# Patient Record
Sex: Female | Born: 1970
Health system: Southern US, Community
[De-identification: ages and names within clinical notes are randomized; demographics above are authoritative.]

## PROBLEM LIST (undated history)

## (undated) DIAGNOSIS — T7840XA Allergy, unspecified, initial encounter: Secondary | ICD-10-CM

## (undated) DIAGNOSIS — R7989 Other specified abnormal findings of blood chemistry: Secondary | ICD-10-CM

## (undated) DIAGNOSIS — Z91018 Allergy to other foods: Secondary | ICD-10-CM

## (undated) DIAGNOSIS — N764 Abscess of vulva: Secondary | ICD-10-CM

## (undated) DIAGNOSIS — J189 Pneumonia, unspecified organism: Secondary | ICD-10-CM

## (undated) HISTORY — DX: Other specified abnormal findings of blood chemistry: R79.89

## (undated) HISTORY — DX: Allergy to other foods: Z91.018

## (undated) HISTORY — DX: Allergy, unspecified, initial encounter: T78.40XA

## (undated) HISTORY — DX: Abscess of vulva: N76.4

## (undated) HISTORY — DX: Pneumonia, unspecified organism: J18.9

---

## 2002-05-08 ENCOUNTER — Encounter: Admission: RE | Admit: 2002-05-08 | Discharge: 2002-05-08 | Payer: Self-pay | Admitting: Family Medicine

## 2002-05-08 ENCOUNTER — Encounter: Payer: Self-pay | Admitting: Family Medicine

## 2002-09-23 DIAGNOSIS — J189 Pneumonia, unspecified organism: Secondary | ICD-10-CM

## 2002-09-23 HISTORY — DX: Pneumonia, unspecified organism: J18.9

## 2004-10-06 ENCOUNTER — Ambulatory Visit: Payer: Self-pay | Admitting: Internal Medicine

## 2005-05-14 ENCOUNTER — Ambulatory Visit: Payer: Self-pay | Admitting: Family Medicine

## 2005-10-26 ENCOUNTER — Ambulatory Visit: Payer: Self-pay | Admitting: Family Medicine

## 2007-01-03 ENCOUNTER — Encounter: Payer: Self-pay | Admitting: Family Medicine

## 2007-01-03 LAB — CONVERTED CEMR LAB
HCT: 40.8 %
Hemoglobin: 14 g/dL
Platelets: 256 10*3/uL
TSH: 1.85 microintl units/mL
WBC: 7.1 10*3/uL

## 2008-07-07 LAB — HM MAMMOGRAPHY: HM Mammogram: NORMAL

## 2008-09-19 ENCOUNTER — Ambulatory Visit: Payer: Self-pay | Admitting: Family Medicine

## 2008-09-19 DIAGNOSIS — J019 Acute sinusitis, unspecified: Secondary | ICD-10-CM

## 2008-12-10 ENCOUNTER — Encounter: Payer: Self-pay | Admitting: Family Medicine

## 2008-12-10 LAB — CONVERTED CEMR LAB: Pap Smear: NORMAL

## 2009-03-29 ENCOUNTER — Encounter (INDEPENDENT_AMBULATORY_CARE_PROVIDER_SITE_OTHER): Payer: Self-pay | Admitting: Internal Medicine

## 2009-04-30 ENCOUNTER — Ambulatory Visit: Payer: Self-pay | Admitting: Family Medicine

## 2009-04-30 DIAGNOSIS — R7989 Other specified abnormal findings of blood chemistry: Secondary | ICD-10-CM | POA: Insufficient documentation

## 2009-05-03 ENCOUNTER — Telehealth (INDEPENDENT_AMBULATORY_CARE_PROVIDER_SITE_OTHER): Payer: Self-pay | Admitting: Internal Medicine

## 2010-01-07 LAB — CONVERTED CEMR LAB: Pap Smear: NORMAL

## 2010-06-27 ENCOUNTER — Ambulatory Visit: Payer: Self-pay | Admitting: Family Medicine

## 2010-07-02 ENCOUNTER — Encounter: Payer: Self-pay | Admitting: Family Medicine

## 2010-12-23 NOTE — Miscellaneous (Signed)
Summary: pap/lab results from GYN  Clinical Lists Changes  Observations: Added new observation of PAST MED HX: none  Receives GYN care from Dr. Arvil Chaco [facility] [place] (07/02/2010 8:24) Added new observation of PAP SMEAR: normal (12/10/2008 8:26) Added new observation of TSH: 1.85 microintl units/mL (01/03/2007 8:24) Added new observation of PLATELETK/UL: 256 K/uL (01/03/2007 8:24) Added new observation of HCT: 40.8 % (01/03/2007 8:24) Added new observation of HGB: 14 g/dL (02/72/5366 4:40) Added new observation of WBC COUNT: 7.1 10*3/microliter (01/03/2007 8:24)       Past History:  Past Medical History: none  Receives GYN care from Dr. Arvil Chaco [facility]

## 2010-12-23 NOTE — Assessment & Plan Note (Signed)
Summary: TRANSFER FROM [facility]   Vital Signs:  Patient profile:   40 year old female Height:      63.75 inches Weight:      127.25 pounds BMI:     22.09 Temp:     98.5 degrees F oral Pulse rate:   64 / minute Pulse rhythm:   regular BP sitting:   116 / 80  (left arm) Cuff size:   regular  Vitals Entered By: Selena Batten Dance CMA Duncan Dull) (June 27, 2010 3:35 PM) CC: Transfer care/follow up from sinus/ear infection   History of Present Illness: CC: establish care  3 wks ago with ear infection/sinus infection/pharyngitis treated with some PCN/cephalosporin 10 days at [place] [facility].  Now with yeast infx presumed, treated with diflucan x2.  Took second diflucan this am, will let us know if not improved.  preventative - had pap smear 12/2009 at [facility] Dr. Haskel Khan.   Elevated iron on routine blood work 03/2009.  No abd pain, no bronzing of skin, weakness, joint pains, fevers, chills, nausea/vomiting, changes in BM, blood in urine or stool.  no fm hx hemochromatosis, but is of [place] descent.  Preventive Screening-Counseling & Management  Alcohol-Tobacco     Alcohol drinks/day: 0     Smoking Status: never  Caffeine-Diet-Exercise     Caffeine use/day: 1/day  Safety-Violence-Falls     Seat Belt Use: yes      Drug Use:  never.    -  Date:  01/07/2010    TD booster TDAP    PAP normal @ GYN  Date:  07/07/2008    Mammogram normal  Current Medications (verified): 1)  None  Allergies (verified): No Known Drug Allergies  Past History:  Past Medical History: none  Family History: Parents healthy, father HTN 1 brother 2 sisters---1 with bleed from AVM (at [facility])                   1 with HTN MGMA- colon cancer PGMA- BRCA  No   Social History: Marital Status: Married Children: 3 Occupation: Sports coach  at [employer] Never Smoked Alcohol use-no No rec drugsCaffeine use/day:  1/day Seat Belt Use:  yes Drug Use:  never  Review of  Systems       The patient complains of headaches.  The patient denies anorexia, fever, weight loss, weight gain, vision loss, decreased hearing, hoarseness, chest pain, syncope, dyspnea on exertion, peripheral edema, prolonged cough, hemoptysis, abdominal pain, melena, hematochezia, severe indigestion/heartburn, hematuria, incontinence, muscle weakness, suspicious skin lesions, transient blindness, difficulty walking, depression, unusual weight change, abnormal bleeding, and breast masses.    Physical Exam  General:  alert, well-developed, well-nourished, and well-hydrated.   Head:  Normocephalic and atraumatic without obvious abnormalities. No apparent alopecia or balding. Eyes:  No corneal or conjunctival inflammation noted. EOMI. Perrla.  Mouth:  Oral mucosa and oropharynx without lesions or exudates.  Teeth in good repair. Neck:  No deformities, masses, or tenderness noted. Lungs:  Normal respiratory effort, chest expands symmetrically. Lungs are clear to auscultation, no crackles or wheezes. Heart:  Normal rate and regular rhythm. S1 and S2 normal without gallop, murmur, click, rub or other extra sounds. Abdomen:  Bowel sounds positive,abdomen soft and non-tender without masses, organomegaly or hernias noted. Pulses:  2+ periph pulses Extremities:  No clubbing, cyanosis, edema, or deformity noted with normal full range of motion of all joints.   Skin:  turgor normal, color normal, and no rashes.  Impression & Recommendations:  Problem # 1:  IRON, SERUM, ELEVATED (ICD-790.6) elevated iron 03/2009.  No sxs of hemochromatosis.  No EtOH intake.  possibly 2/2 increased iron intake when measured.  will recheck iron, TIBC and ferritin when returns for fasting blood work after we get records from GYN.  if abnormal, would check liver US and refer to GI  Problem # 2:  Preventive Health Care (ICD-V70.0) Paps at GYN. mammo at age 60 given fmhx of BRCA. TDAP done this year. await labs from GYN  prior to ordering new blood work, but would want to ensure recent FLP, as well as glu, as well as recheck Iron and CMP.  Patient Instructions: 1)  Please return in 1 year or as needed. 2)  We will obtain records from your GYN office regarding your latest labs.   3)  We will then contact you about coming on for a fasting blood draw. 4)  Please call clinic with questions.  Pleasure to meet you today.  Prior Medications (reviewed today): None Current Allergies (reviewed today): No known allergies    Prevention & Chronic Care Immunizations   Influenza vaccine: Not documented    Tetanus booster: 01/07/2010: TDAP   Tetanus booster due: 01/08/2020    Pneumococcal vaccine: Not documented  Other Screening   Pap smear: normal @ GYN  (01/07/2010)   Pap smear due: 01/07/2013   Smoking status: never  (06/27/2010)  Lipids   Total Cholesterol: Not documented   LDL: Not documented   LDL Direct: Not documented   HDL: Not documented   Triglycerides: Not documented

## 2011-04-09 ENCOUNTER — Encounter: Payer: Self-pay | Admitting: Family Medicine

## 2011-04-09 ENCOUNTER — Ambulatory Visit (INDEPENDENT_AMBULATORY_CARE_PROVIDER_SITE_OTHER): Payer: BC Managed Care – PPO | Admitting: Family Medicine

## 2011-04-09 DIAGNOSIS — J019 Acute sinusitis, unspecified: Secondary | ICD-10-CM

## 2011-04-09 DIAGNOSIS — R7989 Other specified abnormal findings of blood chemistry: Secondary | ICD-10-CM

## 2011-04-09 MED ORDER — AMOXICILLIN-POT CLAVULANATE 875-125 MG PO TABS: 1.0000 | ORAL_TABLET | Freq: Two times a day (BID) | ORAL | Status: AC

## 2011-04-09 MED ORDER — FLUTICASONE PROPIONATE 50 MCG/ACT NA SUSP: 2.0000 | Freq: Every day | NASAL | Status: DC

## 2011-04-09 NOTE — Progress Notes (Addendum)
  Subjective:    Patient ID: Dana Salas, female    DOB: 06-06-1971, 40 y.o.   MRN: [id number]  HPI CC: sinsuitis?  Has been to [facility] x 2 this year, first time double ear infection, ST, then again 1 mo ago with another sinus infection.  Doesn't remember what abx first time but second time used zpack.  Was out on Tuesday playing golf, this started sxs again.  Doesn't think has fully resolved from initial sinus infection.  Had massage at work this week, facial pain, swollen glands noted.  Sxs include ear pain, facial pressure, sinus congestion, drainage, unable to breathe out of nose.  Clear to green mucous.  Using neti pot.  Subjective chills, nausea.  No coughing, abd pain, rash.  No sick contacts, no smokers at home.  Also h/o elevated iron level in past, requests recheck but wants to wait until CPE for this.  Review of Systems Per HPI    Objective:   Physical Exam  Nursing note and vitals reviewed. Constitutional: She appears well-developed and well-nourished. No distress.  HENT:  Head: Normocephalic and atraumatic.  Right Ear: External ear normal.  Left Ear: External ear normal.  Nose: No mucosal edema or rhinorrhea. Right sinus exhibits no maxillary sinus tenderness and no frontal sinus tenderness. Left sinus exhibits no maxillary sinus tenderness and no frontal sinus tenderness.  Mouth/Throat: Uvula is midline, oropharynx is clear and moist and mucous membranes are normal. No oropharyngeal exudate.       + maxillary sinus pressure bilaterally  Eyes: Conjunctivae and EOM are normal. Pupils are equal, round, and reactive to light. No scleral icterus.  Neck: Normal range of motion. Neck supple. No thyromegaly present.  Cardiovascular: Normal rate, regular rhythm, normal heart sounds and intact distal pulses.   No murmur heard. Pulmonary/Chest: Effort normal and breath sounds normal. No respiratory distress. She has no wheezes. She has no rales.  Lymphadenopathy:    She  has cervical adenopathy (bilateral AC LAD, tender).  Skin: Skin is warm and dry. No rash noted.          Assessment & Plan:

## 2011-04-09 NOTE — Assessment & Plan Note (Signed)
Possible chronic component. Treat with 2 wk course augmentin as well as starting flonase regularly. mucinex D. Update [place] if red flags or not improving as expected.

## 2011-04-09 NOTE — Patient Instructions (Signed)
Sounds like sinus infection, possible chronic component. Treat with 2 week course of antibiotics and start using nasal steroid regularly daily. Push fluids and rest.  Hold antihistamine while treating sinusitis. May use mucinex D with plenty of fluid. Update [place] if not improving as expected or any fevers >101, worsening trouble breathing or cough. Good to see you today, call us with questions. Check with GYN about blood work, if they don't draw, may return here for complete physical at your convenience, come in a few days prior fasting for blood work.

## 2011-04-09 NOTE — Assessment & Plan Note (Signed)
Return when ready for CPE, a few days prior for blood work (CBC, iron panel, ferritin, CMP, FLP)

## 2011-05-12 ENCOUNTER — Ambulatory Visit (INDEPENDENT_AMBULATORY_CARE_PROVIDER_SITE_OTHER): Payer: BC Managed Care – PPO | Admitting: Family Medicine

## 2011-05-12 ENCOUNTER — Encounter: Payer: Self-pay | Admitting: Family Medicine

## 2011-05-12 VITALS — BP 122/74 | HR 80 | Temp 98.1°F | Wt 127.0 lb

## 2011-05-12 DIAGNOSIS — R21 Rash and other nonspecific skin eruption: Secondary | ICD-10-CM | POA: Insufficient documentation

## 2011-05-12 NOTE — Progress Notes (Signed)
  Subjective:    Patient ID: Dana Salas, female    DOB: 1971-10-05, 40 y.o.   MRN: [id number]  HPI CC: bug bite.  Last night at ball game, bite left anterior forearm.  Unsure what bit her.  Never really itched.  By time got home, whelp.  Now feeling hand stiffness as well as numbness or stiffness in pinky (intermittent).  Unsure which.    Has tried benadryl for this.  Hasn't taken anything today.    Never had reaction to bug bites in past.  Review of Systems Per HPI    Objective:   Physical Exam  Nursing note and vitals reviewed. Constitutional: She appears well-developed and well-nourished. No distress.  Cardiovascular:  Pulses:      Radial pulses are 2+ on the right side, and 2+ on the left side.  Neurological: She has normal strength. No sensory deficit.  Skin: Skin is warm and dry. Rash noted.          Mildly erythematous 4in length macular rash left anterior forearm. No streaking, nontender, not pruritic.          Assessment & Plan:

## 2011-05-12 NOTE — Patient Instructions (Signed)
Looks like local reaction to bug bite. Keep arm elevated to facilitate resolution First 1-2 days cool compresses then change to warm compresses. Update [place] if fever, spreading redness or streaking, or other concerns. May use NSAID like ibuprofen/advil/alleve.

## 2011-05-12 NOTE — Assessment & Plan Note (Signed)
Likely skin reaction to bug bite. Exam otherwise intact. supportive care as per instructinos.

## 2014-05-28 ENCOUNTER — Encounter: Payer: Self-pay | Admitting: Family Medicine

## 2014-05-28 ENCOUNTER — Ambulatory Visit (INDEPENDENT_AMBULATORY_CARE_PROVIDER_SITE_OTHER): Payer: BC Managed Care – PPO | Admitting: Family Medicine

## 2014-05-28 VITALS — BP 118/60 | HR 80 | Temp 98.3°F | Wt 137.8 lb

## 2014-05-28 DIAGNOSIS — R5381 Other malaise: Secondary | ICD-10-CM | POA: Insufficient documentation

## 2014-05-28 DIAGNOSIS — R5383 Other fatigue: Secondary | ICD-10-CM

## 2014-05-28 NOTE — Assessment & Plan Note (Signed)
Not consistent with tick borne illness. ?food poisoning - discussed anticipated course of improvement. rec clear liquid then bland diet for next 24-48 hours. Update if not improving as expected. Recommended cover L lower tick bite with vaseline and bandaid daily.

## 2014-05-28 NOTE — Progress Notes (Signed)
Pre visit review using our clinic review tool, if applicable. No additional management support is needed unless otherwise documented below in the visit note. 

## 2014-05-28 NOTE — Patient Instructions (Signed)
I wonder about case of food poisoning - bland diet for next 1-2 days and would expect improvement with this. Let me know if not improving as expected. Good to see you today. Call us with quesitons. I hope you start feeling better.

## 2014-05-28 NOTE — Progress Notes (Signed)
   BP 118/60  Pulse 80  Temp(Src) 98.3 F (36.8 C) (Oral)  Wt 137 lb 12 oz (62.483 kg)  LMP 05/26/2014   CC: ?allergic reaction  Subjective:    Patient ID: Dana Salas, female    DOB: [birth year], 43 y.o.   MRN: [id number]  HPI: Dana Salas is a 43 y.o. female presenting on 05/28/2014 for Allergic Reaction   Rough night last night - allergic reaction with entire body itching, skin burning, chills. Started itching at neck and base of scalp. Some upset stomach last night as well (nausea and diarrhea). Diarrhea now better.  Recently used different soap, no other changes. Did eat new taco salad from [employer]. Did also have potato salad at church on Sunday that other family members did not have. Has had several tick bites over last few months recently (2 on R leg and 1 on back). Subjective fever/chills.  No new rash, significant headache, new joint pains. No urinary changes recently. Was in bed all day today with gen malaise. Ate bowl of cereal. Self treated with ibuprofen.  Currently on cycle.  Relevant past medical, surgical, family and social history reviewed and updated as indicated.  Allergies and medications reviewed and updated. Current Outpatient Prescriptions on File Prior to Visit  Medication Sig  . diphenhydrAMINE (BENADRYL) 50 MG capsule Take 50 mg by mouth at bedtime as needed.    . fexofenadine (ALLEGRA) 180 MG tablet Take 180 mg by mouth daily.    . fluticasone (FLONASE) 50 MCG/ACT nasal spray 2 sprays by Nasal route daily.   No current facility-administered medications on file prior to visit.    Review of Systems Per HPI unless specifically indicated above    Objective:    BP 118/60  Pulse 80  Temp(Src) 98.3 F (36.8 C) (Oral)  Wt 137 lb 12 oz (62.483 kg)  LMP 05/26/2014  Physical Exam  Nursing note and vitals reviewed. Constitutional: She appears well-developed and well-nourished. No distress.  HENT:  Head: Normocephalic and atraumatic.    Mouth/Throat: Oropharynx is clear and moist. No oropharyngeal exudate.  Eyes: Conjunctivae and EOM are normal. Pupils are equal, round, and reactive to light. No scleral icterus.  Neck: Normal range of motion. Neck supple. No thyromegaly present.  Cardiovascular: Normal rate, regular rhythm, normal heart sounds and intact distal pulses.   No murmur heard. Pulmonary/Chest: Effort normal and breath sounds normal. No respiratory distress. She has no wheezes. She has no rales.  Abdominal: Soft. Normal appearance and bowel sounds are normal. She exhibits no distension and no mass. There is tenderness in the epigastric area and suprapubic area. There is no rigidity, no rebound, no guarding, no CVA tenderness and negative Murphy's sign.  Musculoskeletal: She exhibits no edema.  Skin: Rash noted.  Small nodules R leg, larger nodule L lower back with mild surrounding induration, no erythema, no foreign body appreciated.       Assessment & Plan:   Problem List Items Addressed This Visit   Malaise - Primary     Not consistent with tick borne illness. ?food poisoning - discussed anticipated course of improvement. rec clear liquid then bland diet for next 24-48 hours. Update if not improving as expected. Recommended cover L lower tick bite with vaseline and bandaid daily.        Follow up plan: Return as needed, for follow up visit.

## 2014-08-18 LAB — HM PAP SMEAR: HM Pap smear: NEGATIVE

## 2014-09-28 ENCOUNTER — Ambulatory Visit: Payer: Self-pay | Admitting: Obstetrics and Gynecology

## 2015-08-20 ENCOUNTER — Encounter: Payer: Self-pay | Admitting: Obstetrics and Gynecology

## 2015-09-03 ENCOUNTER — Encounter: Payer: Self-pay | Admitting: Obstetrics and Gynecology

## 2015-09-03 ENCOUNTER — Ambulatory Visit (INDEPENDENT_AMBULATORY_CARE_PROVIDER_SITE_OTHER): Payer: BLUE CROSS/BLUE SHIELD | Admitting: Obstetrics and Gynecology

## 2015-09-03 DIAGNOSIS — Z01419 Encounter for gynecological examination (general) (routine) without abnormal findings: Secondary | ICD-10-CM

## 2015-09-03 LAB — POCT URINALYSIS DIP (MANUAL ENTRY)
BILIRUBIN UA: NEGATIVE
BILIRUBIN UA: NEGATIVE
Blood, UA: NEGATIVE
GLUCOSE UA: NEGATIVE
LEUKOCYTES UA: NEGATIVE
NITRITE UA: NEGATIVE
Protein Ur, POC: NEGATIVE
Spec Grav, UA: 1.015
Urobilinogen, UA: 0.2
pH, UA: 6

## 2015-09-03 NOTE — Progress Notes (Signed)
GYNECOLOGY ANNUAL EXAM  Subjective:    Dana Salas is a 44 y.o. [id number] female who presents for an annual exam. The patient has no complaints today. The patient is sexually active. GYN screening history: last pap: approximate date 07/2014 and was normal and last mammogram: approximate date 09/2014 and was normal. The patient wears seatbelts: yes. The patient participates in regular exercise: yes. Has the patient ever been transfused or tattooed?: no. The patient reports that there is not domestic violence in her life.   Menstrual History: Obstetric History   G3   P3   T3   P0   A0   TAB0   SAB0   E0   M0   L3     # Outcome Date GA Lbr Len/2nd Weight Sex Delivery Anes PTL Lv  3 Term 2002   6 lb 10 oz (3.005 kg) M Vag-Spont   Y  2 Term 2000   6 lb 13 oz (3.09 kg) M Vag-Spont   Y  1 Term 1998   7 lb 11 oz (3.487 kg) M Vag-Spont   Y     Menarche age: 5 Patient's last menstrual period was 08/27/2015.  Reports normal occuring menses, lasting 4-5 days.  Denies h/o abnormal pap smears or STIs.    Past Medical History  Diagnosis Date  . Other abnormal blood chemistry     Iron,serum, elevated  . Pneumonia 11/03    outpatient  . Boil, labium 33/2016    No past surgical history on file.  Family History  Problem Relation Age of Onset  . Hypertension Father   . COPD Father   . Hypertension Sister   . AVM Sister     with bleed (at [facility])  . Colon cancer Maternal Grandmother   . Breast cancer Paternal Grandmother     Social History   Social History  . Marital Status: Married    Spouse Name: N/A  . Number of Children: 3  . Years of Education: N/A   Occupational History  . Programme researcher, broadcasting/film/video at [employer] History Main Topics  . Smoking status: Never Smoker   . Smokeless tobacco: Not on file  . Alcohol Use: No  . Drug Use: No  . Sexual Activity: Not on file   Other Topics Concern  . 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 diphenhydrAMINE (BENADRYL) 50 MG capsule Take 50 mg by mouth at bedtime as needed.      . fexofenadine (ALLEGRA) 180 MG tablet Take 180 mg by mouth daily.      . fluticasone (FLONASE) 50 MCG/ACT nasal spray 2 sprays by Nasal route daily. 16 g 3   No current facility-administered medications on file prior to visit.    No Known Allergies   Review of Systems Constitutional: negative for chills, fatigue, fevers and sweats Eyes: negative for irritation, redness and visual disturbance Ears, nose, mouth, throat, and face: negative for hearing loss, nasal congestion, snoring and tinnitus Respiratory: negative for asthma, cough, sputum Cardiovascular: negative for chest pain, dyspnea, exertional chest pressure/discomfort, irregular heart beat, palpitations and syncope Gastrointestinal: negative for abdominal pain, change in bowel habits, nausea and vomiting Genitourinary: negative for abnormal menstrual periods, genital  lesions, sexual problems and vaginal discharge, dysuria and urinary incontinence Integument/breast: negative for breast lump, breast tenderness and nipple discharge Hematologic/lymphatic: negative for bleeding and easy bruising Musculoskeletal:negative for back pain and muscle weakness Neurological: negative for dizziness, headaches, vertigo and weakness Endocrine: negative for diabetic symptoms including polydipsia, polyuria and skin dryness Allergic/Immunologic: negative for hay fever and urticaria    Objective:   General Appearance:    Alert, cooperative, no distress, appears stated age  Head:    Normocephalic, without obvious abnormality, atraumatic  Eyes:    PERRL, conjunctiva/corneas clear, EOM's intact, both eyes  Ears:    Normal external ear canals, both ears  Nose:   Nares normal, septum midline, mucosa normal, no drainage or sinus  tenderness  Throat:   Lips, mucosa, and tongue normal; teeth and gums normal  Neck:   Supple, symmetrical, trachea midline, no adenopathy; thyroid: no enlargement/tenderness/nodules; no carotid bruit or JVD  Back:     Symmetric, no curvature, ROM normal, no CVA tenderness  Lungs:     Clear to auscultation bilaterally, respirations unlabored  Chest Wall:    No tenderness or deformity   Heart:    Regular rate and rhythm, S1 and S2 normal, no murmur, rub or gallop  Breast Exam:    No tenderness, masses, or nipple abnormality  Abdomen:     Soft, non-tender, bowel sounds active all four quadrants, no masses, no organomegaly.    Genitalia:    Pelvic:external genitalia normal, vagina with lesions, discharge, or tenderness, rectovaginal septum  normal. Cervix normal in appearance, no cervical motion tenderness.  Uterus normal size, shape, consistency, mobile.  No adnexal masses or tenderness.    Rectal:    Normal external sphincter.  No hemorrhoids appreciated. Internal exam not done.   Extremities:   Extremities normal, atraumatic, no cyanosis or edema  Pulses:   2+ and symmetric all extremities  Skin:   Skin color, texture, turgor normal, no rashes or lesions  Lymph nodes:   Cervical, supraclavicular, and axillary nodes normal  Neurologic:   CNII-XII intact, normal strength, sensation and reflexes throughout      Assessment:    Healthy female exam.    Plan:    Declines STI testing.   Blood tests: Basic metabolic panel, CBC with diff, Lipoproteins, Total cholesterol and gluocse. Breast self exam technique reviewed and patient encouraged to perform self-exam monthly. Contraception: vasectomy. Follow up in 1 year. Mammogram.   Up to date on pap smear.  Does not need repeat until 2018.    Rubie Maid, MD [facility]

## 2015-09-03 NOTE — Patient Instructions (Signed)
Health Maintenance, Female Adopting a healthy lifestyle and getting preventive care can go a long way to promote health and wellness. Talk with your health care provider about what schedule of regular examinations is right for you. This is a good chance for you to check in with your provider about disease prevention and staying healthy. In between checkups, there are plenty of things you can do on your own. Experts have done a lot of research about which lifestyle changes and preventive measures are most likely to keep you healthy. Ask your health care provider for more information. WEIGHT AND DIET  Eat a healthy diet  Be sure to include plenty of vegetables, fruits, low-fat dairy products, and lean protein.  Do not eat a lot of foods high in solid fats, added sugars, or salt.  Get regular exercise. This is one of the most important things you can do for your health.  Most adults should exercise for at least 150 minutes each week. The exercise should increase your heart rate and make you sweat (moderate-intensity exercise).  Most adults should also do strengthening exercises at least twice a week. This is in addition to the moderate-intensity exercise.  Maintain a healthy weight  Body mass index (BMI) is a measurement that can be used to identify possible weight problems. It estimates body fat based on height and weight. Your health care provider can help determine your BMI and help you achieve or maintain a healthy weight.  For females 20 years of age and older:   A BMI below 18.5 is considered underweight.  A BMI of 18.5 to 24.9 is normal.  A BMI of 25 to 29.9 is considered overweight.  A BMI of 30 and above is considered obese.  Watch levels of cholesterol and blood lipids  You should start having your blood tested for lipids and cholesterol at 42 years of age, then have this test every 5 years.  You may need to have your cholesterol levels checked more often if:  Your lipid  or cholesterol levels are high.  You are older than 42 years of age.  You are at high risk for heart disease.  CANCER SCREENING   Lung Cancer  Lung cancer screening is recommended for adults 55-80 years old who are at high risk for lung cancer because of a history of smoking.  A yearly low-dose CT scan of the lungs is recommended for people who:  Currently smoke.  Have quit within the past 15 years.  Have at least a 30-pack-year history of smoking. A pack year is smoking an average of one pack of cigarettes a day for 1 year.  Yearly screening should continue until it has been 15 years since you quit.  Yearly screening should stop if you develop a health problem that would prevent you from having lung cancer treatment.  Breast Cancer  Practice breast self-awareness. This means understanding how your breasts normally appear and feel.  It also means doing regular breast self-exams. Let your health care provider know about any changes, no matter how small.  If you are in your 20s or 30s, you should have a clinical breast exam (CBE) by a health care provider every 1-3 years as part of a regular health exam.  If you are 40 or older, have a CBE every year. Also consider having a breast X-ray (mammogram) every year.  If you have a family history of breast cancer, talk to your health care provider about genetic screening.  If you   are at high risk for breast cancer, talk to your health care provider about having an MRI and a mammogram every year.  Breast cancer gene (BRCA) assessment is recommended for women who have family members with BRCA-related cancers. BRCA-related cancers include:  Breast.  Ovarian.  Tubal.  Peritoneal cancers.  Results of the assessment will determine the need for genetic counseling and BRCA1 and BRCA2 testing. Cervical Cancer Your health care provider may recommend that you be screened regularly for cancer of the pelvic organs (ovaries, uterus, and  vagina). This screening involves a pelvic examination, including checking for microscopic changes to the surface of your cervix (Pap test). You may be encouraged to have this screening done every 3 years, beginning at age 21.  For women ages 30-65, health care providers may recommend pelvic exams and Pap testing every 3 years, or they may recommend the Pap and pelvic exam, combined with testing for human papilloma virus (HPV), every 5 years. Some types of HPV increase your risk of cervical cancer. Testing for HPV may also be done on women of any age with unclear Pap test results.  Other health care providers may not recommend any screening for nonpregnant women who are considered low risk for pelvic cancer and who do not have symptoms. Ask your health care provider if a screening pelvic exam is right for you.  If you have had past treatment for cervical cancer or a condition that could lead to cancer, you need Pap tests and screening for cancer for at least 20 years after your treatment. If Pap tests have been discontinued, your risk factors (such as having a new sexual partner) need to be reassessed to determine if screening should resume. Some women have medical problems that increase the chance of getting cervical cancer. In these cases, your health care provider may recommend more frequent screening and Pap tests. Colorectal Cancer  This type of cancer can be detected and often prevented.  Routine colorectal cancer screening usually begins at 42 years of age and continues through 42 years of age.  Your health care provider may recommend screening at an earlier age if you have risk factors for colon cancer.  Your health care provider may also recommend using home test kits to check for hidden blood in the stool.  A small camera at the end of a tube can be used to examine your colon directly (sigmoidoscopy or colonoscopy). This is done to check for the earliest forms of colorectal  cancer.  Routine screening usually begins at age 50.  Direct examination of the colon should be repeated every 5-10 years through 42 years of age. However, you may need to be screened more often if early forms of precancerous polyps or small growths are found. Skin Cancer  Check your skin from head to toe regularly.  Tell your health care provider about any new moles or changes in moles, especially if there is a change in a mole's shape or color.  Also tell your health care provider if you have a mole that is larger than the size of a pencil eraser.  Always use sunscreen. Apply sunscreen liberally and repeatedly throughout the day.  Protect yourself by wearing long sleeves, pants, a wide-brimmed hat, and sunglasses whenever you are outside. HEART DISEASE, DIABETES, AND HIGH BLOOD PRESSURE   High blood pressure causes heart disease and increases the risk of stroke. High blood pressure is more likely to develop in:  People who have blood pressure in the high end   of the normal range (130-139/85-89 mm Hg).  People who are overweight or obese.  People who are African American.  If you are 38-23 years of age, have your blood pressure checked every 3-5 years. If you are 61 years of age or older, have your blood pressure checked every year. You should have your blood pressure measured twice--once when you are at a hospital or clinic, and once when you are not at a hospital or clinic. Record the average of the two measurements. To check your blood pressure when you are not at a hospital or clinic, you can use:  An automated blood pressure machine at a pharmacy.  A home blood pressure monitor.  If you are between 45 years and 39 years old, ask your health care provider if you should take aspirin to prevent strokes.  Have regular diabetes screenings. This involves taking a blood sample to check your fasting blood sugar level.  If you are at a normal weight and have a low risk for diabetes,  have this test once every three years after 42 years of age.  If you are overweight and have a high risk for diabetes, consider being tested at a younger age or more often. PREVENTING INFECTION  Hepatitis B  If you have a higher risk for hepatitis B, you should be screened for this virus. You are considered at high risk for hepatitis B if:  You were born in a country where hepatitis B is common. Ask your health care provider which countries are considered high risk.  Your parents were born in a high-risk country, and you have not been immunized against hepatitis B (hepatitis B vaccine).  You have HIV or AIDS.  You use needles to inject street drugs.  You live with someone who has hepatitis B.  You have had sex with someone who has hepatitis B.  You get hemodialysis treatment.  You take certain medicines for conditions, including cancer, organ transplantation, and autoimmune conditions. Hepatitis C  Blood testing is recommended for:  Everyone born from [birth year] through 1963.  Anyone with known risk factors for hepatitis C. Sexually transmitted infections (STIs)  You should be screened for sexually transmitted infections (STIs) including gonorrhea and chlamydia if:  You are sexually active and are younger than 42 years of age.  You are older than 42 years of age and your health care provider tells you that you are at risk for this type of infection.  Your sexual activity has changed since you were last screened and you are at an increased risk for chlamydia or gonorrhea. Ask your health care provider if you are at risk.  If you do not have HIV, but are at risk, it may be recommended that you take a prescription medicine daily to prevent HIV infection. This is called pre-exposure prophylaxis (PrEP). You are considered at risk if:  You are sexually active and do not regularly use condoms or know the HIV status of your partner(s).  You take drugs by injection.  You are sexually  active with a partner who has HIV. Talk with your health care provider about whether you are at high risk of being infected with HIV. If you choose to begin PrEP, you should first be tested for HIV. You should then be tested every 3 months for as long as you are taking PrEP.  PREGNANCY   If you are premenopausal and you may become pregnant, ask your health care provider about preconception counseling.  If you may  become pregnant, take 400 to 800 micrograms (mcg) of folic acid every day.  If you want to prevent pregnancy, talk to your health care provider about birth control (contraception). OSTEOPOROSIS AND MENOPAUSE   Osteoporosis is a disease in which the bones lose minerals and strength with aging. This can result in serious bone fractures. Your risk for osteoporosis can be identified using a bone density scan.  If you are 61 years of age or older, or if you are at risk for osteoporosis and fractures, ask your health care provider if you should be screened.  Ask your health care provider whether you should take a calcium or vitamin D supplement to lower your risk for osteoporosis.  Menopause may have certain physical symptoms and risks.  Hormone replacement therapy may reduce some of these symptoms and risks. Talk to your health care provider about whether hormone replacement therapy is right for you.  HOME CARE INSTRUCTIONS   Schedule regular health, dental, and eye exams.  Stay current with your immunizations.   Do not use any tobacco products including cigarettes, chewing tobacco, or electronic cigarettes.  If you are pregnant, do not drink alcohol.  If you are breastfeeding, limit how much and how often you drink alcohol.  Limit alcohol intake to no more than 1 drink per day for nonpregnant women. One drink equals 12 ounces of beer, 5 ounces of wine, or 1 ounces of hard liquor.  Do not use street drugs.  Do not share needles.  Ask your health care provider for help if  you need support or information about quitting drugs.  Tell your health care provider if you often feel depressed.  Tell your health care provider if you have ever been abused or do not feel safe at home.   This information is not intended to replace advice given to you by your health care provider. Make sure you discuss any questions you have with your health care provider.   Document Released: 05/25/2011 Document Revised: 11/30/2014 Document Reviewed: 10/11/2013 Elsevier Interactive Patient Education [employer].

## 2015-09-04 LAB — CBC
Hematocrit: 40.5 % (ref 34.0–46.6)
Hemoglobin: 13.7 g/dL (ref 11.1–15.9)
MCH: 31.4 pg (ref 26.6–33.0)
MCHC: 33.8 g/dL (ref 31.5–35.7)
MCV: 93 fL (ref 79–97)
Platelets: 229 10*3/uL (ref 150–379)
RBC: 4.36 x10E6/uL (ref 3.77–5.28)
RDW: 13 % (ref 12.3–15.4)
WBC: 6.3 10*3/uL (ref 3.4–10.8)

## 2015-09-04 LAB — BASIC METABOLIC PANEL
BUN / CREAT RATIO: 22 (ref 9–23)
BUN: 15 mg/dL (ref 6–24)
CO2: 25 mmol/L (ref 18–29)
CREATININE: 0.68 mg/dL (ref 0.57–1.00)
Calcium: 9.2 mg/dL (ref 8.7–10.2)
Chloride: 99 mmol/L (ref 97–108)
GFR calc Af Amer: 125 mL/min/{1.73_m2} (ref >59)
GFR, EST NON AFRICAN AMERICAN: 108 mL/min/{1.73_m2} (ref >59)
GLUCOSE: 89 mg/dL (ref 65–99)
POTASSIUM: 3.9 mmol/L (ref 3.5–5.2)
SODIUM: 137 mmol/L (ref 134–144)

## 2015-09-04 LAB — LIPID PANEL
CHOL/HDL RATIO: 2.8 ratio (ref 0.0–4.4)
CHOLESTEROL TOTAL: 176 mg/dL (ref 100–199)
HDL: 64 mg/dL (ref >39)
LDL CALC: 94 mg/dL (ref 0–99)
Triglycerides: 89 mg/dL (ref 0–149)
VLDL Cholesterol Cal: 18 mg/dL (ref 5–40)

## 2015-11-06 ENCOUNTER — Ambulatory Visit
Admission: RE | Admit: 2015-11-06 | Discharge: 2015-11-06 | Disposition: A | Discharge status: Home / Self Care | Payer: BLUE CROSS/BLUE SHIELD | Source: Ambulatory Visit | Attending: Obstetrics and Gynecology | Admitting: Obstetrics and Gynecology

## 2015-11-06 DIAGNOSIS — Z1231 Encounter for screening mammogram for malignant neoplasm of breast: Secondary | ICD-10-CM | POA: Diagnosis not present

## 2015-11-06 DIAGNOSIS — Z01419 Encounter for gynecological examination (general) (routine) without abnormal findings: Secondary | ICD-10-CM

## 2016-01-22 ENCOUNTER — Encounter: Payer: Self-pay | Admitting: Obstetrics and Gynecology

## 2016-01-22 ENCOUNTER — Ambulatory Visit (INDEPENDENT_AMBULATORY_CARE_PROVIDER_SITE_OTHER): Payer: BLUE CROSS/BLUE SHIELD | Admitting: Obstetrics and Gynecology

## 2016-01-22 VITALS — BP 115/73 | HR 84 | Ht 64.0 in | Wt 139.9 lb

## 2016-01-22 DIAGNOSIS — L0231 Cutaneous abscess of buttock: Secondary | ICD-10-CM

## 2016-01-22 MED ORDER — SULFAMETHOXAZOLE-TRIMETHOPRIM 800-160 MG PO TABS: 1.0000 | ORAL_TABLET | Freq: Two times a day (BID) | ORAL | Status: DC

## 2016-01-22 NOTE — Progress Notes (Signed)
   GYNECOLOGY CLINIC PROGRESS NOTE  Subjective:     Dana Salas is a 45 y.o. G21P3003 female who presents for evaluation of a probable cutaneous abscess. Lesion is located in the left buttock. Onset was 4 days ago. Symptoms have gradually worsened.  Patient notes that she has tried warm compresses, raw potato without relief.  Site is tender to touch. Denies fevers, chills, nausea, vomiting.   The following portions of the patient's history were reviewed and updated as appropriate: allergies, current medications, past family history, past medical history, past social history, past surgical history and problem list.     ROS Review of Systems - Negative except what is noted in HPI.     Objective:  Blood pressure 115/73, pulse 84, height 5\' 4"  (1.626 m), weight 139 lb 14.4 oz (63.458 kg), last menstrual period 12/30/2015.  General appearance: alert and no distress Abdomen: soft, non-tender; bowel sounds normal; no masses,  no organomegaly Pelvic: deferred Extremities: extremities normal, atraumatic, no cyanosis or edema Skin:  There is an area characterized by a subcutaneous mass consistent with a cutaneous abscess, erythema surrounding area measuring 2 cm, induration, fluctuance, tenderness, purulent drainage measuring 2 cm in greatest dimension. Location: left buttock. Lymph nodes: Cervical, supraclavicular, and axillary nodes normal.    Procedure Informed consent obtained.  The area was prepped in the usual manner and the skin overlying the abscess was anesthetized with 3 cc of 1% plain lidocaine.  The area was sharply incised and approx 10 ccs of purulent material was obtained. Packing was not inserted.    Assessment:     Cutaneous abscess.    Plan:    Apply hot compresses/sitz baths frequently to promote drainage. Reassured that this represents a benign process. Prescribed Bactrim DS BID x 7 days.  I & D procedure as above. RTC in 1 week or PRN.       Rubie Maid,  MD [facility]

## 2016-01-22 NOTE — Patient Instructions (Signed)
Perianal Abscess An abscess is an infected area that contains a collection of pus and debris. A perianal abscess is one that occurs in the perineal area, which is the area between the anus and the scrotum in males and between the anus and the vagina in females. Perianal abscesses can vary in size. Without treatment, a perianal abscess can become larger and cause other problems. CAUSES  Glands in the perineal area can become plugged up with debris. When this happens, an abscess may form.  SIGNS AND SYMPTOMS  The most common symptoms of a perianal abscess are:  Swelling and redness in the area of the abscess. The redness may go beyond the abscess and appear as a red streak on the skin.  Pain in the area of the abscess. Other possible symptoms include:  1. A visible lump or a lump that can be felt when touching the area and is usually painful. 2. Bleeding or pus-like discharge from the area. 3. Fever. 4. General weakness. DIAGNOSIS  Your health care provider will take a medical history and examine the area. This may involve examining the rectal area with a gloved hand (digital rectal exam). For women, it may require a careful vaginal exam. Sometimes, the health care provider needs to look into the rectum using a probe or scope. TREATMENT  Treatment often requires making a cut (incision) in the abscess to drain the pus. This can sometimes be done in your health care provider's office or an emergency department after giving you medicine to numb the area (local anesthetic). For larger or deeper abscesses, surgery may be required to drain the abscess. Antibiotic medicines are sometimes given if there is infection of the surrounding tissue (cellulitis). In some cases, gauze is packed into the abscess to continue draining the area. Frequent sitz baths may be recommended to help the wound heal and to reduce the chance of the abscess coming back. HOME CARE INSTRUCTIONS   Only take over-the-counter or  prescription medicines for pain, fever, or discomfort as directed by your health care provider.  Take antibiotic medicine as directed. Make sure you finish it even if you start to feel better.  If gauze is used in the abscess, follow your health care provider's instructions for removing or changing the gauze. It can usually be removed in 2-3 days.  If one or more drains have been placed in the abscess cavity, be careful not to pull at them. Your health care provider will tell you how long they need to remain in place.  Take warm sitz baths 3-4 times a day and after bowel movements. This will help reduce pain and swelling.  Keep the skin around the abscess clean and dry. Avoid cleaning the area too much.  Avoid scratching the abscess area.  Avoid using colored or perfumed toilet papers. SEEK MEDICAL CARE IF:   You have trouble having a bowel movement or passing urine.  Your pain or swelling in the affected area does not seem to be improving.  The gauze packing or the drains come out before the planned time. SEEK IMMEDIATE MEDICAL CARE IF:   You have problems moving or using your legs.  You have severe or increasing pain.  Your swelling in the affected area suddenly gets worse.  You have a large increase in bleeding or passing of pus.  You have chills or a fever. MAKE SURE YOU:   Understand these instructions.  Will watch your condition.  Will get help right away if you are  not doing well or get worse.   This information is not intended to replace advice given to you by your health care provider. Make sure you discuss any questions you have with your health care provider.   Document Released: 12/16/2006 Document Revised: 08/30/2013 Document Reviewed: 06/21/2013 Elsevier Interactive Patient Education 2016 [employer].       How to Take a [employer] A sitz bath is a warm water bath that is taken while you are sitting down. The water should only come up to your hips  and should cover your buttocks. Your health care provider may recommend a sitz bath to help you:   Clean the lower part of your body, including your genital area.  With itching.  With pain.  With sore muscles or muscles that tighten or spasm. HOW TO TAKE A SITZ BATH Take 3-4 sitz baths per day or as told by your health care provider. 5. Partially fill a bathtub with warm water. You will only need the water to be deep enough to cover your hips and buttocks when you are sitting in it. 6. If your health care provider told you to put medicine in the water, follow the directions exactly. 7. Sit in the water and open the tub drain a little. 8. Turn on the warm water again to keep the tub at the correct level. Keep the water running constantly. 9. Soak in the water for 15-20 minutes or as told by your health care provider. 10. After the sitz bath, pat the affected area dry first. Do not rub it. 11. Be careful when you stand up after the sitz bath because you may feel dizzy. SEEK MEDICAL CARE IF:  Your symptoms get worse. Do not continue with sitz baths if your symptoms get worse.  You have new symptoms. Do not continue with sitz baths until you talk with your health care provider.   This information is not intended to replace advice given to you by your health care provider. Make sure you discuss any questions you have with your health care provider.   Document Released: 08/01/2004 Document Revised: 03/26/2015 Document Reviewed: 11/07/2014 Elsevier Interactive Patient Education [employer].

## 2016-01-27 LAB — ANAEROBIC AND AEROBIC CULTURE

## 2016-01-30 ENCOUNTER — Ambulatory Visit (INDEPENDENT_AMBULATORY_CARE_PROVIDER_SITE_OTHER): Payer: BLUE CROSS/BLUE SHIELD | Admitting: Obstetrics and Gynecology

## 2016-01-30 VITALS — BP 99/65 | HR 68 | Ht 64.0 in | Wt 143.0 lb

## 2016-01-30 DIAGNOSIS — L0231 Cutaneous abscess of buttock: Secondary | ICD-10-CM

## 2016-01-30 NOTE — Progress Notes (Signed)
   GYNECOLOGY CLINIC PROGRESS NOTE  Subjective:     Dana Salas is a 45 y.o. G19P3003 female who presents for follow up of left buttock cutaneous abscess. Symptoms have essentially resolved.  Notes compliance with antibiotic therapy and warm compresses.  The following portions of the patient's history were reviewed and updated as appropriate: allergies, current medications, past family history, past medical history, past social history, past surgical history and problem list.     ROS Review of Systems - Negative except what is noted in HPI.     Objective:  Blood pressure 99/65, pulse 68, height 5\' 4"  (1.626 m), weight 143 lb (64.864 kg), last menstrual period 12/30/2015.  General appearance: alert and no distress Abdomen: soft, non-tender; bowel sounds normal; no masses,  no organomegaly Pelvic: deferred Extremities: extremities normal, atraumatic, no cyanosis or edema Skin:  Well healed incision site, no induration, no subcutaneous masses palpable, nontender, no erythema. Subcutaneous abscess resolved. Lymph nodes: Cervical, supraclavicular, and axillary nodes normal.   Assessment:     Cutaneous abscess of left buttock, resolved.    Plan:   Discussed prevention measures for future abscesses in the area, including: gently washing the area well with soapy water every day, using an antibacterial soap, always drying the area well, and not wearing tight clothing over the area. Follow up as needed.    Rubie Maid, MD [facility]

## 2016-01-30 NOTE — Patient Instructions (Signed)
Abscess °An abscess (boil or furuncle) is an infected area on or under the skin. This area is filled with yellowish-white fluid (pus) and other material (debris). °HOME CARE  °· Only take medicines as told by your doctor. °· If you were given antibiotic medicine, take it as directed. Finish the medicine even if you start to feel better. °· If gauze is used, follow your doctor's directions for changing the gauze. °· To avoid spreading the infection: °¨ Keep your abscess covered with a bandage. °¨ Wash your hands well. °¨ Do not share personal care items, towels, or whirlpools with others. °¨ Avoid skin contact with others. °· Keep your skin and clothes clean around the abscess. °· Keep all doctor visits as told. °GET HELP RIGHT AWAY IF:  °· You have more pain, puffiness (swelling), or redness in the wound site. °· You have more fluid or blood coming from the wound site. °· You have muscle aches, chills, or you feel sick. °· You have a fever. °MAKE SURE YOU:  °· Understand these instructions. °· Will watch your condition. °· Will get help right away if you are not doing well or get worse. °  °This information is not intended to replace advice given to you by your health care provider. Make sure you discuss any questions you have with your health care provider. °  °Document Released: 04/27/2008 Document Revised: 05/10/2012 Document Reviewed: 01/23/2012 °Elsevier Interactive Patient Education ©2016 [employer] ° °

## 2016-09-03 ENCOUNTER — Encounter: Payer: BLUE CROSS/BLUE SHIELD | Admitting: Obstetrics and Gynecology

## 2016-09-24 ENCOUNTER — Encounter: Payer: Self-pay | Admitting: Obstetrics and Gynecology

## 2016-09-24 ENCOUNTER — Ambulatory Visit (INDEPENDENT_AMBULATORY_CARE_PROVIDER_SITE_OTHER): Payer: BLUE CROSS/BLUE SHIELD | Admitting: Obstetrics and Gynecology

## 2016-09-24 VITALS — BP 107/66 | HR 78 | Ht 64.0 in | Wt 145.8 lb

## 2016-09-24 DIAGNOSIS — Z01419 Encounter for gynecological examination (general) (routine) without abnormal findings: Secondary | ICD-10-CM

## 2016-09-24 DIAGNOSIS — Z1231 Encounter for screening mammogram for malignant neoplasm of breast: Secondary | ICD-10-CM | POA: Diagnosis not present

## 2016-09-24 DIAGNOSIS — Z1239 Encounter for other screening for malignant neoplasm of breast: Secondary | ICD-10-CM

## 2016-09-24 NOTE — Progress Notes (Addendum)
GYNECOLOGY ANNUAL PHYSICAL EXAM PROGRESS NOTE  Subjective:    Dana Salas is a 45 y.o. G60P3003 female who presents for an annual exam. The patient has no complaints today. Patient does report that she recently finished an OTC treatment for recent vaginal yeast infection.  The patient is sexually active. The patient wears seatbelts: yes. The patient participates in regular exercise: no. Has the patient ever been transfused or tattooed?: no. The patient reports that there is not domestic violence in her life.     Menstrual History: Menarche age: 8 Patient's last menstrual period was 09/01/2016. Period Cycle (Days): 25 Period Duration (Days): 2 Period Pattern: Regular Menstrual Flow: Moderate Dysmenorrhea: (!) MildReports normal occuring menses, lasting 4-5 days.    Gynecologic History:  Last pap: approximate date 07/2014 and was normal. Denies h/o abnormal pap smears or STIs.  Last mammogram: approximate date 10/2015 and was normal.  Contraception: female vasectomy   OB History  Gravida Para Term Preterm AB Living  3 3 3     3   SAB TAB Ectopic Multiple Live Births          3    # Outcome Date GA Lbr Len/2nd Weight Sex Delivery Anes PTL Lv  3 Term 2002   6 lb 10 oz (3.005 kg) M Vag-Spont   LIV  2 Term 2000   6 lb 13 oz (3.09 kg) M Vag-Spont   LIV  1 Term 1998   7 lb 11 oz (3.487 kg) M Vag-Spont   LIV      Past Medical History:  Diagnosis Date  . Boil, labium 33/2016  . Other abnormal blood chemistry    Iron,serum, elevated  . Pneumonia 11/03   outpatient    History reviewed. No pertinent surgical history.  Family History  Problem Relation Age of Onset  . Hypertension Father   . COPD Father   . Hypertension Sister   . AVM Sister     with bleed (at [facility])  . Colon cancer Maternal Grandmother   . Breast cancer Paternal Grandmother     Social History   Social History  . Marital status: Married    Spouse name: N/A  . Number of children: 3  . Years  of education: N/A   Occupational History  . Programme researcher, broadcasting/film/video at [employer] History Main Topics  . Smoking status: Never Smoker  . Smokeless tobacco: Never Used  . Alcohol use No  . Drug use: No  . Sexual activity: Yes    Birth control/ protection: None   Other Topics Concern  . Not on file   Social History Narrative   Married      3 children      Loan asst. At [employer]             No current outpatient prescriptions on file prior to visit.   No current facility-administered medications on file prior to visit.     No Known Allergies   Review of Systems Constitutional: negative for chills, fatigue, fevers and sweats Eyes: negative for irritation, redness and visual disturbance Ears, nose, mouth, throat, and face: negative for hearing loss, nasal congestion, snoring and tinnitus Respiratory: negative for asthma, cough, sputum Cardiovascular: negative for chest pain, dyspnea, exertional chest pressure/discomfort, irregular heart beat, palpitations and syncope Gastrointestinal: negative for abdominal pain, change in bowel habits, nausea and vomiting Genitourinary: negative for abnormal menstrual periods, genital lesions, sexual problems and vaginal discharge, dysuria and  urinary incontinence Integument/breast: negative for breast lump, breast tenderness and nipple discharge Hematologic/lymphatic: negative for bleeding and easy bruising Musculoskeletal:negative for back pain and muscle weakness Neurological: negative for dizziness, headaches, vertigo and weakness Endocrine: negative for diabetic symptoms including polydipsia, polyuria and skin dryness Allergic/Immunologic: negative for hay fever and urticaria    Objective:  Blood pressure 107/66, pulse 78, height 5\' 4"  (1.626 m), weight 145 lb 12.8 oz (66.1 kg), last menstrual period 09/01/2016.  General Appearance:    Alert, cooperative, no distress, appears stated age  Head:    Normocephalic, without  obvious abnormality, atraumatic  Eyes:    PERRL, conjunctiva/corneas clear, EOM's intact, both eyes  Ears:    Normal external ear canals, both ears  Nose:   Nares normal, septum midline, mucosa normal, no drainage or sinus tenderness  Throat:   Lips, mucosa, and tongue normal; teeth and gums normal  Neck:   Supple, symmetrical, trachea midline, no adenopathy; thyroid: no enlargement/tenderness/nodules; no carotid bruit or JVD  Back:     Symmetric, no curvature, ROM normal, no CVA tenderness  Lungs:     Clear to auscultation bilaterally, respirations unlabored  Chest Wall:    No tenderness or deformity   Heart:    Regular rate and rhythm, S1 and S2 normal, no murmur, rub or gallop  Breast Exam:    No tenderness, masses, or nipple abnormality  Abdomen:     Soft, non-tender, bowel sounds active all four quadrants, no masses, no organomegaly.    Genitalia:    Pelvic:external genitalia normal, vagina with lesions, discharge, or tenderness, rectovaginal septum  normal. Cervix normal in appearance, no cervical motion tenderness.  Uterus normal size, shape, consistency, mobile.  No adnexal masses or tenderness.    Rectal:    Normal external sphincter.  No hemorrhoids appreciated. Internal exam not done.   Extremities:   Extremities normal, atraumatic, no cyanosis or edema  Pulses:   2+ and symmetric all extremities  Skin:   Skin color, texture, turgor normal, no rashes or lesions  Lymph nodes:   Cervical, supraclavicular, and axillary nodes normal  Neurologic:   CNII-XII intact, normal strength, sensation and reflexes throughout     Labs:  Lab Results  Component Value Date   WBC 6.3 09/03/2015   HGB 14 01/03/2007   HCT 40.5 09/03/2015   MCV 93 09/03/2015   PLT 229 09/03/2015    Lab Results  Component Value Date   CREATININE 0.68 09/03/2015   BUN 15 09/03/2015   NA 137 09/03/2015   K 3.9 09/03/2015   CL 99 09/03/2015   CO2 25 09/03/2015    No results found for: ALT, AST, GGT,  ALKPHOS, BILITOT  Lab Results  Component Value Date   TSH 1.85 01/03/2007      Assessment:    Healthy female exam.   Plan:   Blood tests: Complete metabolic panel, CBC with diff, Lipoproteins, Total cholesterol and gluocse. Breast self exam technique reviewed and patient encouraged to perform self-exam monthly. Discussed healthy lifestyle management.  Encouraged exercise and maintaining healthy balanced diet.  Contraception: vasectomy. Mammogram ordered.   Declines flu vaccine Up to date on pap smear.  Does not need repeat until 2018.  Follow up in 1 year.   Rubie Maid, MD [facility]

## 2016-09-24 NOTE — Patient Instructions (Signed)
Health Maintenance, Female Adopting a healthy lifestyle and getting preventive care can go a long way to promote health and wellness. Talk with your health care provider about what schedule of regular examinations is right for you. This is a good chance for you to check in with your provider about disease prevention and staying healthy. In between checkups, there are plenty of things you can do on your own. Experts have done a lot of research about which lifestyle changes and preventive measures are most likely to keep you healthy. Ask your health care provider for more information. WEIGHT AND DIET  Eat a healthy diet  Be sure to include plenty of vegetables, fruits, low-fat dairy products, and lean protein.  Do not eat a lot of foods high in solid fats, added sugars, or salt.  Get regular exercise. This is one of the most important things you can do for your health.  Most adults should exercise for at least 150 minutes each week. The exercise should increase your heart rate and make you sweat (moderate-intensity exercise).  Most adults should also do strengthening exercises at least twice a week. This is in addition to the moderate-intensity exercise.  Maintain a healthy weight  Body mass index (BMI) is a measurement that can be used to identify possible weight problems. It estimates body fat based on height and weight. Your health care provider can help determine your BMI and help you achieve or maintain a healthy weight.  For females 20 years of age and older:   A BMI below 18.5 is considered underweight.  A BMI of 18.5 to 24.9 is normal.  A BMI of 25 to 29.9 is considered overweight.  A BMI of 30 and above is considered obese.  Watch levels of cholesterol and blood lipids  You should start having your blood tested for lipids and cholesterol at 43 years of age, then have this test every 5 years.  You may need to have your cholesterol levels checked more often if:  Your lipid  or cholesterol levels are high.  You are older than 43 years of age.  You are at high risk for heart disease.  CANCER SCREENING   Lung Cancer  Lung cancer screening is recommended for adults 55-80 years old who are at high risk for lung cancer because of a history of smoking.  A yearly low-dose CT scan of the lungs is recommended for people who:  Currently smoke.  Have quit within the past 15 years.  Have at least a 30-pack-year history of smoking. A pack year is smoking an average of one pack of cigarettes a day for 1 year.  Yearly screening should continue until it has been 15 years since you quit.  Yearly screening should stop if you develop a health problem that would prevent you from having lung cancer treatment.  Breast Cancer  Practice breast self-awareness. This means understanding how your breasts normally appear and feel.  It also means doing regular breast self-exams. Let your health care provider know about any changes, no matter how small.  If you are in your 20s or 30s, you should have a clinical breast exam (CBE) by a health care provider every 1-3 years as part of a regular health exam.  If you are 40 or older, have a CBE every year. Also consider having a breast X-ray (mammogram) every year.  If you have a family history of breast cancer, talk to your health care provider about genetic screening.  If you   are at high risk for breast cancer, talk to your health care provider about having an MRI and a mammogram every year.  Breast cancer gene (BRCA) assessment is recommended for women who have family members with BRCA-related cancers. BRCA-related cancers include:  Breast.  Ovarian.  Tubal.  Peritoneal cancers.  Results of the assessment will determine the need for genetic counseling and BRCA1 and BRCA2 testing. Cervical Cancer Your health care provider may recommend that you be screened regularly for cancer of the pelvic organs (ovaries, uterus, and  vagina). This screening involves a pelvic examination, including checking for microscopic changes to the surface of your cervix (Pap test). You may be encouraged to have this screening done every 3 years, beginning at age 21.  For women ages 30-65, health care providers may recommend pelvic exams and Pap testing every 3 years, or they may recommend the Pap and pelvic exam, combined with testing for human papilloma virus (HPV), every 5 years. Some types of HPV increase your risk of cervical cancer. Testing for HPV may also be done on women of any age with unclear Pap test results.  Other health care providers may not recommend any screening for nonpregnant women who are considered low risk for pelvic cancer and who do not have symptoms. Ask your health care provider if a screening pelvic exam is right for you.  If you have had past treatment for cervical cancer or a condition that could lead to cancer, you need Pap tests and screening for cancer for at least 20 years after your treatment. If Pap tests have been discontinued, your risk factors (such as having a new sexual partner) need to be reassessed to determine if screening should resume. Some women have medical problems that increase the chance of getting cervical cancer. In these cases, your health care provider may recommend more frequent screening and Pap tests. Colorectal Cancer  This type of cancer can be detected and often prevented.  Routine colorectal cancer screening usually begins at 43 years of age and continues through 43 years of age.  Your health care provider may recommend screening at an earlier age if you have risk factors for colon cancer.  Your health care provider may also recommend using home test kits to check for hidden blood in the stool.  A small camera at the end of a tube can be used to examine your colon directly (sigmoidoscopy or colonoscopy). This is done to check for the earliest forms of colorectal  cancer.  Routine screening usually begins at age 50.  Direct examination of the colon should be repeated every 5-10 years through 43 years of age. However, you may need to be screened more often if early forms of precancerous polyps or small growths are found. Skin Cancer  Check your skin from head to toe regularly.  Tell your health care provider about any new moles or changes in moles, especially if there is a change in a mole's shape or color.  Also tell your health care provider if you have a mole that is larger than the size of a pencil eraser.  Always use sunscreen. Apply sunscreen liberally and repeatedly throughout the day.  Protect yourself by wearing long sleeves, pants, a wide-brimmed hat, and sunglasses whenever you are outside. HEART DISEASE, DIABETES, AND HIGH BLOOD PRESSURE   High blood pressure causes heart disease and increases the risk of stroke. High blood pressure is more likely to develop in:  People who have blood pressure in the high end   of the normal range (130-139/85-89 mm Hg).  People who are overweight or obese.  People who are African American.  If you are 38-23 years of age, have your blood pressure checked every 3-5 years. If you are 61 years of age or older, have your blood pressure checked every year. You should have your blood pressure measured twice--once when you are at a hospital or clinic, and once when you are not at a hospital or clinic. Record the average of the two measurements. To check your blood pressure when you are not at a hospital or clinic, you can use:  An automated blood pressure machine at a pharmacy.  A home blood pressure monitor.  If you are between 45 years and 39 years old, ask your health care provider if you should take aspirin to prevent strokes.  Have regular diabetes screenings. This involves taking a blood sample to check your fasting blood sugar level.  If you are at a normal weight and have a low risk for diabetes,  have this test once every three years after 43 years of age.  If you are overweight and have a high risk for diabetes, consider being tested at a younger age or more often. PREVENTING INFECTION  Hepatitis B  If you have a higher risk for hepatitis B, you should be screened for this virus. You are considered at high risk for hepatitis B if:  You were born in a country where hepatitis B is common. Ask your health care provider which countries are considered high risk.  Your parents were born in a high-risk country, and you have not been immunized against hepatitis B (hepatitis B vaccine).  You have HIV or AIDS.  You use needles to inject street drugs.  You live with someone who has hepatitis B.  You have had sex with someone who has hepatitis B.  You get hemodialysis treatment.  You take certain medicines for conditions, including cancer, organ transplantation, and autoimmune conditions. Hepatitis C  Blood testing is recommended for:  Everyone born from [birth year] through 1963.  Anyone with known risk factors for hepatitis C. Sexually transmitted infections (STIs)  You should be screened for sexually transmitted infections (STIs) including gonorrhea and chlamydia if:  You are sexually active and are younger than 43 years of age.  You are older than 43 years of age and your health care provider tells you that you are at risk for this type of infection.  Your sexual activity has changed since you were last screened and you are at an increased risk for chlamydia or gonorrhea. Ask your health care provider if you are at risk.  If you do not have HIV, but are at risk, it may be recommended that you take a prescription medicine daily to prevent HIV infection. This is called pre-exposure prophylaxis (PrEP). You are considered at risk if:  You are sexually active and do not regularly use condoms or know the HIV status of your partner(s).  You take drugs by injection.  You are sexually  active with a partner who has HIV. Talk with your health care provider about whether you are at high risk of being infected with HIV. If you choose to begin PrEP, you should first be tested for HIV. You should then be tested every 3 months for as long as you are taking PrEP.  PREGNANCY   If you are premenopausal and you may become pregnant, ask your health care provider about preconception counseling.  If you may  become pregnant, take 400 to 800 micrograms (mcg) of folic acid every day.  If you want to prevent pregnancy, talk to your health care provider about birth control (contraception). OSTEOPOROSIS AND MENOPAUSE   Osteoporosis is a disease in which the bones lose minerals and strength with aging. This can result in serious bone fractures. Your risk for osteoporosis can be identified using a bone density scan.  If you are 61 years of age or older, or if you are at risk for osteoporosis and fractures, ask your health care provider if you should be screened.  Ask your health care provider whether you should take a calcium or vitamin D supplement to lower your risk for osteoporosis.  Menopause may have certain physical symptoms and risks.  Hormone replacement therapy may reduce some of these symptoms and risks. Talk to your health care provider about whether hormone replacement therapy is right for you.  HOME CARE INSTRUCTIONS   Schedule regular health, dental, and eye exams.  Stay current with your immunizations.   Do not use any tobacco products including cigarettes, chewing tobacco, or electronic cigarettes.  If you are pregnant, do not drink alcohol.  If you are breastfeeding, limit how much and how often you drink alcohol.  Limit alcohol intake to no more than 1 drink per day for nonpregnant women. One drink equals 12 ounces of beer, 5 ounces of wine, or 1 ounces of hard liquor.  Do not use street drugs.  Do not share needles.  Ask your health care provider for help if  you need support or information about quitting drugs.  Tell your health care provider if you often feel depressed.  Tell your health care provider if you have ever been abused or do not feel safe at home.   This information is not intended to replace advice given to you by your health care provider. Make sure you discuss any questions you have with your health care provider.   Document Released: 05/25/2011 Document Revised: 11/30/2014 Document Reviewed: 10/11/2013 Elsevier Interactive Patient Education [employer].

## 2016-09-25 ENCOUNTER — Other Ambulatory Visit: Payer: BLUE CROSS/BLUE SHIELD

## 2016-09-25 DIAGNOSIS — Z01419 Encounter for gynecological examination (general) (routine) without abnormal findings: Secondary | ICD-10-CM

## 2016-09-26 LAB — CBC
HEMATOCRIT: 39.9 % (ref 34.0–46.6)
Hemoglobin: 13.9 g/dL (ref 11.1–15.9)
MCH: 31.6 pg (ref 26.6–33.0)
MCHC: 34.8 g/dL (ref 31.5–35.7)
MCV: 91 fL (ref 79–97)
Platelets: 196 10*3/uL (ref 150–379)
RBC: 4.4 x10E6/uL (ref 3.77–5.28)
RDW: 12.4 % (ref 12.3–15.4)
WBC: 4.6 10*3/uL (ref 3.4–10.8)

## 2016-09-26 LAB — COMPREHENSIVE METABOLIC PANEL
A/G RATIO: 2 (ref 1.2–2.2)
ALBUMIN: 4.3 g/dL (ref 3.5–5.5)
ALT: 10 IU/L (ref 0–32)
AST: 17 IU/L (ref 0–40)
Alkaline Phosphatase: 56 IU/L (ref 39–117)
BILIRUBIN TOTAL: 0.6 mg/dL (ref 0.0–1.2)
BUN / CREAT RATIO: 16 (ref 9–23)
BUN: 12 mg/dL (ref 6–24)
CALCIUM: 9.1 mg/dL (ref 8.7–10.2)
CHLORIDE: 101 mmol/L (ref 96–106)
CO2: 24 mmol/L (ref 18–29)
Creatinine, Ser: 0.73 mg/dL (ref 0.57–1.00)
GFR, EST AFRICAN AMERICAN: 117 mL/min/{1.73_m2} (ref >59)
GFR, EST NON AFRICAN AMERICAN: 101 mL/min/{1.73_m2} (ref >59)
GLOBULIN, TOTAL: 2.1 g/dL (ref 1.5–4.5)
Glucose: 77 mg/dL (ref 65–99)
POTASSIUM: 4.6 mmol/L (ref 3.5–5.2)
SODIUM: 139 mmol/L (ref 134–144)
TOTAL PROTEIN: 6.4 g/dL (ref 6.0–8.5)

## 2016-09-26 LAB — TSH: TSH: 1.53 u[IU]/mL (ref 0.450–4.500)

## 2016-09-28 ENCOUNTER — Telehealth: Payer: Self-pay

## 2016-09-28 ENCOUNTER — Other Ambulatory Visit: Payer: Self-pay | Admitting: Obstetrics and Gynecology

## 2016-09-28 DIAGNOSIS — Z1231 Encounter for screening mammogram for malignant neoplasm of breast: Secondary | ICD-10-CM

## 2016-09-28 NOTE — Telephone Encounter (Signed)
-----   Message from Rubie Maid, MD sent at 09/26/2016  2:45 PM EDT ----- Please inform of normal annual labs.

## 2016-09-28 NOTE — Telephone Encounter (Signed)
Called pt no answer. LM for pt informing her of information below.  

## 2016-09-29 ENCOUNTER — Encounter: Payer: Self-pay | Admitting: Obstetrics and Gynecology

## 2016-10-02 ENCOUNTER — Other Ambulatory Visit: Payer: BLUE CROSS/BLUE SHIELD

## 2016-10-02 ENCOUNTER — Other Ambulatory Visit: Payer: Self-pay

## 2016-10-02 DIAGNOSIS — Z13220 Encounter for screening for lipoid disorders: Secondary | ICD-10-CM

## 2016-10-02 DIAGNOSIS — Z01419 Encounter for gynecological examination (general) (routine) without abnormal findings: Secondary | ICD-10-CM

## 2016-10-03 LAB — LIPID PANEL
Chol/HDL Ratio: 3 ratio units (ref 0.0–4.4)
Cholesterol, Total: 175 mg/dL (ref 100–199)
HDL: 58 mg/dL (ref >39)
LDL Calculated: 104 mg/dL — ABNORMAL HIGH (ref 0–99)
TRIGLYCERIDES: 65 mg/dL (ref 0–149)
VLDL Cholesterol Cal: 13 mg/dL (ref 5–40)

## 2016-11-06 ENCOUNTER — Ambulatory Visit
Admission: RE | Admit: 2016-11-06 | Discharge: 2016-11-06 | Disposition: A | Discharge status: Home / Self Care | Payer: BLUE CROSS/BLUE SHIELD | Source: Ambulatory Visit | Attending: Obstetrics and Gynecology | Admitting: Obstetrics and Gynecology

## 2016-11-06 DIAGNOSIS — Z1231 Encounter for screening mammogram for malignant neoplasm of breast: Secondary | ICD-10-CM | POA: Diagnosis not present

## 2017-09-28 ENCOUNTER — Encounter: Payer: BLUE CROSS/BLUE SHIELD | Admitting: Obstetrics and Gynecology

## 2017-09-28 ENCOUNTER — Encounter: Payer: Self-pay | Admitting: Obstetrics and Gynecology

## 2017-09-28 ENCOUNTER — Ambulatory Visit (INDEPENDENT_AMBULATORY_CARE_PROVIDER_SITE_OTHER): Payer: BLUE CROSS/BLUE SHIELD | Admitting: Obstetrics and Gynecology

## 2017-09-28 ENCOUNTER — Other Ambulatory Visit: Payer: Self-pay | Admitting: Obstetrics and Gynecology

## 2017-09-28 VITALS — BP 99/68 | HR 74 | Ht 64.0 in | Wt 130.1 lb

## 2017-09-28 DIAGNOSIS — Z124 Encounter for screening for malignant neoplasm of cervix: Secondary | ICD-10-CM

## 2017-09-28 DIAGNOSIS — Z1231 Encounter for screening mammogram for malignant neoplasm of breast: Secondary | ICD-10-CM

## 2017-09-28 DIAGNOSIS — Z01419 Encounter for gynecological examination (general) (routine) without abnormal findings: Secondary | ICD-10-CM

## 2017-09-28 DIAGNOSIS — Z1239 Encounter for other screening for malignant neoplasm of breast: Secondary | ICD-10-CM

## 2017-09-28 NOTE — Patient Instructions (Signed)

## 2017-09-28 NOTE — Progress Notes (Signed)
GYNECOLOGY ANNUAL PHYSICAL EXAM PROGRESS NOTE  Subjective:    Dana Salas is a 46 y.o. G67P3003 female who presents for an annual exam. The patient has no complaints today.   The patient is sexually active. The patient wears seatbelts: yes. The patient participates in regular exercise: no. Has the patient ever been transfused or tattooed?: no. The patient reports that there is not domestic violence in her life.     Menstrual History: Menarche age: 74 Patient's last menstrual period was 09/04/2017.  Reports normal occuring menses, lasting 4-5 days.    Gynecologic History:  Last pap: approximate date 07/2014 and was normal. Denies h/o abnormal pap smears or STIs.  Last mammogram: approximate date 10/2016 and was normal.  Contraception: female vasectomy   OB History  Gravida Para Term Preterm AB Living  3 3 3     3   SAB TAB Ectopic Multiple Live Births          3    # Outcome Date GA Lbr Len/2nd Weight Sex Delivery Anes PTL Lv  3 Term 2002   6 lb 10 oz (3.005 kg) M Vag-Spont   LIV  2 Term 2000   6 lb 13 oz (3.09 kg) M Vag-Spont   LIV  1 Term 1998   7 lb 11 oz (3.487 kg) M Vag-Spont   LIV      Past Medical History:  Diagnosis Date  . Boil, labium 33/2016  . Other abnormal blood chemistry    Iron,serum, elevated  . Pneumonia 11/03   outpatient    No past surgical history on file.  Family History  Problem Relation Age of Onset  . Hypertension Father   . COPD Father   . Hypertension Sister   . AVM Sister        with bleed (at [facility])  . Colon cancer Maternal Grandmother   . Breast cancer Paternal Grandmother     Social History   Socioeconomic History  . Marital status: Married    Spouse name: Not on file  . Number of children: 3  . Years of education: Not on file  . Highest education level: Not on file  Social Needs  . Financial resource strain: Not on file  . Food insecurity - worry: Not on file  . Food insecurity - inability: Not on file  .  Transportation needs - medical: Not on file  . Transportation needs - non-medical: Not on file  Occupational History  . Occupation: Programme researcher, broadcasting/film/video at [employer]  . Smoking status: Never Smoker  . Smokeless tobacco: Never Used  Substance and Sexual Activity  . Alcohol use: No  . Drug use: No  . Sexual activity: Yes    Birth control/protection: None  Other Topics Concern  . 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   No current facility-administered medications on file prior to visit.     No Known Allergies   Review of Systems Constitutional: negative for chills, fatigue, fevers and sweats.  Positive for intentional weight loss (18 lbs over last 6-8 months).  Eyes: negative for irritation, redness and visual disturbance Ears, nose, mouth, throat, and face: negative for hearing loss, nasal congestion, snoring and tinnitus Respiratory: negative for asthma,  cough, sputum Cardiovascular: negative for chest pain, dyspnea, exertional chest pressure/discomfort, irregular heart beat, palpitations and syncope Gastrointestinal: negative for abdominal pain, change in bowel habits, nausea and vomiting Genitourinary: negative for abnormal menstrual periods, genital lesions, sexual problems and vaginal discharge, dysuria and urinary incontinence Integument/breast: negative for breast lump, breast tenderness and nipple discharge Hematologic/lymphatic: negative for bleeding and easy bruising Musculoskeletal:negative for back pain and muscle weakness Neurological: negative for dizziness, headaches, vertigo and weakness Endocrine: negative for diabetic symptoms including polydipsia, polyuria and skin dryness Allergic/Immunologic: negative for hay fever and urticaria    Objective:  Blood pressure 99/68, pulse 74, height 5\' 4"  (1.626 m), weight 130 lb  1.6 oz (59 kg), last menstrual period 09/04/2017.  General Appearance:    Alert, cooperative, no distress, appears stated age  Head:    Normocephalic, without obvious abnormality, atraumatic  Eyes:    PERRL, conjunctiva/corneas clear, EOM's intact, both eyes  Ears:    Normal external ear canals, both ears  Nose:   Nares normal, septum midline, mucosa normal, no drainage or sinus tenderness  Throat:   Lips, mucosa, and tongue normal; teeth and gums normal  Neck:   Supple, symmetrical, trachea midline, no adenopathy; thyroid: no enlargement/tenderness/nodules; no carotid bruit or JVD  Back:     Symmetric, no curvature, ROM normal, no CVA tenderness  Lungs:     Clear to auscultation bilaterally, respirations unlabored  Chest Wall:    No tenderness or deformity   Heart:    Regular rate and rhythm, S1 and S2 normal, no murmur, rub or gallop  Breast Exam:    No tenderness, masses, or nipple abnormality  Abdomen:     Soft, non-tender, bowel sounds active all four quadrants, no masses, no organomegaly.    Genitalia:    Pelvic:external genitalia normal, vagina with lesions, discharge, or tenderness, rectovaginal septum  normal. Cervix normal in appearance, no cervical motion tenderness.  Uterus normal size, shape, consistency, mobile.  No adnexal masses or tenderness.    Rectal:    Normal external sphincter.  No hemorrhoids appreciated. Internal exam not done.   Extremities:   Extremities normal, atraumatic, no cyanosis or edema  Pulses:   2+ and symmetric all extremities  Skin:   Skin color, texture, turgor normal, no rashes or lesions  Lymph nodes:   Cervical, supraclavicular, and axillary nodes normal  Neurologic:   CNII-XII intact, normal strength, sensation and reflexes throughout     Labs:  Lab Results  Component Value Date   WBC 4.6 09/25/2016   HGB 13.9 09/25/2016   HCT 39.9 09/25/2016   MCV 91 09/25/2016   PLT 196 09/25/2016     Lab Results  Component Value Date   CREATININE  0.73 09/25/2016   BUN 12 09/25/2016   NA 139 09/25/2016   K 4.6 09/25/2016   CL 101 09/25/2016   CO2 24 09/25/2016    Lab Results  Component Value Date   ALT 10 09/25/2016   AST 17 09/25/2016   ALKPHOS 56 09/25/2016   BILITOT 0.6 09/25/2016    Lab Results  Component Value Date   CHOL 175 10/02/2016   HDL 58 10/02/2016   LDLCALC 104 (H) 10/02/2016   TRIG 65 10/02/2016   CHOLHDL 3.0 10/02/2016    Lab Results  Component Value Date   TSH 1.530 09/25/2016      Assessment:    Healthy female exam.   Cervical cancer screening Breast cancer screening Plan:  Blood tests: Complete metabolic panel, CBC  with diff Breast self exam technique reviewed and patient encouraged to perform self-exam monthly. Discussed healthy lifestyle management.  Encouraged exercise and maintaining healthy balanced diet.  Pap smear performed today Contraception: vasectomy. Mammogram ordered.   Declines flu vaccine Follow up in 1 year.   Rubie Maid, MD [facility]

## 2017-09-29 LAB — COMPREHENSIVE METABOLIC PANEL
A/G RATIO: 2.4 — AB (ref 1.2–2.2)
ALBUMIN: 4.6 g/dL (ref 3.5–5.5)
ALT: 11 IU/L (ref 0–32)
AST: 17 IU/L (ref 0–40)
Alkaline Phosphatase: 50 IU/L (ref 39–117)
BUN / CREAT RATIO: 11 (ref 9–23)
BUN: 9 mg/dL (ref 6–24)
Bilirubin Total: 0.6 mg/dL (ref 0.0–1.2)
CALCIUM: 9.3 mg/dL (ref 8.7–10.2)
CO2: 23 mmol/L (ref 20–29)
CREATININE: 0.83 mg/dL (ref 0.57–1.00)
Chloride: 103 mmol/L (ref 96–106)
GFR calc Af Amer: 99 mL/min/{1.73_m2} (ref >59)
GFR, EST NON AFRICAN AMERICAN: 86 mL/min/{1.73_m2} (ref >59)
GLOBULIN, TOTAL: 1.9 g/dL (ref 1.5–4.5)
Glucose: 83 mg/dL (ref 65–99)
Potassium: 4.3 mmol/L (ref 3.5–5.2)
SODIUM: 140 mmol/L (ref 134–144)
Total Protein: 6.5 g/dL (ref 6.0–8.5)

## 2017-09-29 LAB — CBC
HEMATOCRIT: 40.1 % (ref 34.0–46.6)
HEMOGLOBIN: 13.4 g/dL (ref 11.1–15.9)
MCH: 31.4 pg (ref 26.6–33.0)
MCHC: 33.4 g/dL (ref 31.5–35.7)
MCV: 94 fL (ref 79–97)
Platelets: 217 10*3/uL (ref 150–379)
RBC: 4.27 x10E6/uL (ref 3.77–5.28)
RDW: 13 % (ref 12.3–15.4)
WBC: 4.9 10*3/uL (ref 3.4–10.8)

## 2017-10-01 LAB — IGP, COBASHPV16/18
HPV 16: NEGATIVE
HPV 18: NEGATIVE
HPV other hr types: NEGATIVE
PAP Smear Comment: 0

## 2017-11-08 ENCOUNTER — Ambulatory Visit
Admission: RE | Admit: 2017-11-08 | Discharge: 2017-11-08 | Disposition: A | Discharge status: Home / Self Care | Payer: BLUE CROSS/BLUE SHIELD | Source: Ambulatory Visit | Attending: Obstetrics and Gynecology | Admitting: Obstetrics and Gynecology

## 2017-11-08 DIAGNOSIS — Z1231 Encounter for screening mammogram for malignant neoplasm of breast: Secondary | ICD-10-CM | POA: Diagnosis present

## 2018-03-30 ENCOUNTER — Ambulatory Visit: Payer: Self-pay | Admitting: Podiatry

## 2018-09-27 DIAGNOSIS — W19XXXA Unspecified fall, initial encounter: Secondary | ICD-10-CM | POA: Diagnosis not present

## 2018-09-27 DIAGNOSIS — T07XXXA Unspecified multiple injuries, initial encounter: Secondary | ICD-10-CM | POA: Diagnosis not present

## 2018-09-27 DIAGNOSIS — M25561 Pain in right knee: Secondary | ICD-10-CM | POA: Diagnosis not present

## 2018-09-27 DIAGNOSIS — R51 Headache: Secondary | ICD-10-CM | POA: Diagnosis not present

## 2018-09-29 ENCOUNTER — Ambulatory Visit (INDEPENDENT_AMBULATORY_CARE_PROVIDER_SITE_OTHER): Payer: BLUE CROSS/BLUE SHIELD | Admitting: Obstetrics and Gynecology

## 2018-09-29 ENCOUNTER — Encounter: Payer: Self-pay | Admitting: Obstetrics and Gynecology

## 2018-09-29 VITALS — BP 126/85 | HR 81 | Ht 64.0 in | Wt 133.7 lb

## 2018-09-29 DIAGNOSIS — Z01419 Encounter for gynecological examination (general) (routine) without abnormal findings: Secondary | ICD-10-CM | POA: Diagnosis not present

## 2018-09-29 DIAGNOSIS — Z13220 Encounter for screening for lipoid disorders: Secondary | ICD-10-CM

## 2018-09-29 DIAGNOSIS — N6011 Diffuse cystic mastopathy of right breast: Secondary | ICD-10-CM

## 2018-09-29 DIAGNOSIS — N6012 Diffuse cystic mastopathy of left breast: Secondary | ICD-10-CM

## 2018-09-29 DIAGNOSIS — Z1239 Encounter for other screening for malignant neoplasm of breast: Secondary | ICD-10-CM | POA: Diagnosis not present

## 2018-09-29 NOTE — Patient Instructions (Signed)
Preventive Care 18-39 Years, Female Preventive care refers to lifestyle choices and visits with your health care provider that can promote health and wellness. What does preventive care include?  A yearly physical exam. This is also called an annual well check.  Dental exams once or twice a year.  Routine eye exams. Ask your health care provider how often you should have your eyes checked.  Personal lifestyle choices, including: ? Daily care of your teeth and gums. ? Regular physical activity. ? Eating a healthy diet. ? Avoiding tobacco and drug use. ? Limiting alcohol use. ? Practicing safe sex. ? Taking vitamin and mineral supplements as recommended by your health care provider. What happens during an annual well check? The services and screenings done by your health care provider during your annual well check will depend on your age, overall health, lifestyle risk factors, and family history of disease. Counseling Your health care provider may ask you questions about your:  Alcohol use.  Tobacco use.  Drug use.  Emotional well-being.  Home and relationship well-being.  Sexual activity.  Eating habits.  Work and work Statistician.  Method of birth control.  Menstrual cycle.  Pregnancy history.  Screening You may have the following tests or measurements:  Height, weight, and BMI.  Diabetes screening. This is done by checking your blood sugar (glucose) after you have not eaten for a while (fasting).  Blood pressure.  Lipid and cholesterol levels. These may be checked every 5 years starting at age 38.  Skin check.  Hepatitis C blood test.  Hepatitis B blood test.  Sexually transmitted disease (STD) testing.  BRCA-related cancer screening. This may be done if you have a family history of breast, ovarian, tubal, or peritoneal cancers.  Pelvic exam and Pap test. This may be done every 3 years starting at age 38. Starting at age 30, this may be done  every 5 years if you have a Pap test in combination with an HPV test.  Discuss your test results, treatment options, and if necessary, the need for more tests with your health care provider. Vaccines Your health care provider may recommend certain vaccines, such as:  Influenza vaccine. This is recommended every year.  Tetanus, diphtheria, and acellular pertussis (Tdap, Td) vaccine. You may need a Td booster every 10 years.  Varicella vaccine. You may need this if you have not been vaccinated.  HPV vaccine. If you are 39 or younger, you may need three doses over 6 months.  Measles, mumps, and rubella (MMR) vaccine. You may need at least one dose of MMR. You may also need a second dose.  Pneumococcal 13-valent conjugate (PCV13) vaccine. You may need this if you have certain conditions and were not previously vaccinated.  Pneumococcal polysaccharide (PPSV23) vaccine. You may need one or two doses if you smoke cigarettes or if you have certain conditions.  Meningococcal vaccine. One dose is recommended if you are age 68-21 years and a first-year college student living in a residence hall, or if you have one of several medical conditions. You may also need additional booster doses.  Hepatitis A vaccine. You may need this if you have certain conditions or if you travel or work in places where you may be exposed to hepatitis A.  Hepatitis B vaccine. You may need this if you have certain conditions or if you travel or work in places where you may be exposed to hepatitis B.  Haemophilus influenzae type b (Hib) vaccine. You may need this  if you have certain risk factors.  Talk to your health care provider about which screenings and vaccines you need and how often you need them. This information is not intended to replace advice given to you by your health care provider. Make sure you discuss any questions you have with your health care provider. Document Released: 01/05/2002 Document Revised:  07/29/2016 Document Reviewed: 09/10/2015 Elsevier Interactive Patient Education  2018 Elsevier Inc[employer]  

## 2018-09-29 NOTE — Progress Notes (Signed)
GYNECOLOGY ANNUAL PHYSICAL EXAM PROGRESS NOTE  Subjective:    Dana Salas is a 47 y.o. G77P3003 female who presents for an annual exam. The patient has no complaints today.   The patient is sexually active. The patient wears seatbelts: yes. The patient participates in regular exercise: yes (intermittently). Has the patient ever been transfused or tattooed?: no. The patient reports that there is not domestic violence in her life.     Patient does note recent fall earlier this week, was seen at urgent care and had imaging done due to fall to the face.  Menstrual History: Menarche age: 49 Patient's last menstrual period was 09/18/2018.  Reports normal occuring menses, lasting 4-5 days, occurring q 21-25 days.    Gynecologic History:  Last pap: approximate date 07/2014 and was normal. Denies h/o abnormal pap smears or STIs.  Last mammogram: approximate date 10/2016 and was normal.  Contraception: female vasectomy   OB History  Gravida Para Term Preterm AB Living  3 3 3     3   SAB TAB Ectopic Multiple Live Births          3    # Outcome Date GA Lbr Len/2nd Weight Sex Delivery Anes PTL Lv  3 Term 2002   6 lb 10 oz (3.005 kg) M Vag-Spont   LIV  2 Term 2000   6 lb 13 oz (3.09 kg) M Vag-Spont   LIV  1 Term 1998   7 lb 11 oz (3.487 kg) M Vag-Spont   LIV    Past Medical History:  Diagnosis Date  . Boil, labium 33/2016  . Other abnormal blood chemistry    Iron,serum, elevated  . Pneumonia 11/03   outpatient    History reviewed. No pertinent surgical history.  Family History  Problem Relation Age of Onset  . Hypertension Father   . COPD Father   . Hypertension Sister   . AVM Sister        with bleed (at [facility])  . Colon cancer Maternal Grandmother   . Breast cancer Paternal Grandmother     Social History   Socioeconomic History  . Marital status: Married    Spouse name: Not on file  . Number of children: 3  . Years of education: Not on file  . Highest  education level: Not on file  Occupational History  . Occupation: Programme researcher, broadcasting/film/video at [employer]  . Financial resource strain: Not on file  . Food insecurity:    Worry: Not on file    Inability: Not on file  . Transportation needs:    Medical: Not on file    Non-medical: Not on file  Tobacco Use  . Smoking status: Never Smoker  . Smokeless tobacco: Never Used  Substance and Sexual Activity  . Alcohol use: Yes    Alcohol/week: 1.0 standard drinks    Types: 1 Glasses of wine per week  . Drug use: No  . Sexual activity: Yes    Birth control/protection: None  Lifestyle  . Physical activity:    Days per week: Not on file    Minutes per session: Not on file  . Stress: Not on file  Relationships  . Social connections:    Talks on phone: Not on file    Gets together: Not on file    Attends religious service: Not on file    Active member of club or organization: Not on file    Attends meetings of clubs  or organizations: Not on file    Relationship status: Not on file  . Intimate partner violence:    Fear of current or ex partner: Not on file    Emotionally abused: Not on file    Physically abused: Not on file    Forced sexual activity: Not on file  Other Topics Concern  . 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   No current facility-administered medications on file prior to visit.     No Known Allergies   Review of Systems Constitutional: negative for chills, fatigue, fevers and sweats.   Eyes: negative for irritation, redness and visual disturbance Ears, nose, mouth, throat, and face: negative for hearing loss, nasal congestion, snoring and tinnitus Respiratory: negative for asthma, cough, sputum Cardiovascular: negative for chest pain, dyspnea, exertional chest pressure/discomfort, irregular heart beat, palpitations and  syncope Gastrointestinal: negative for abdominal pain, change in bowel habits, nausea and vomiting Genitourinary: negative for abnormal menstrual periods, genital lesions, sexual problems and vaginal discharge, dysuria and urinary incontinence Integument/breast: negative for breast lump, breast tenderness and nipple discharge Hematologic/lymphatic: negative for bleeding and easy bruising Musculoskeletal:negative for back pain and muscle weakness. Positive for occasional shoulder pain.  Neurological: negative for dizziness, headaches, vertigo and weakness Endocrine: negative for diabetic symptoms including polydipsia, polyuria and skin dryness Allergic/Immunologic: negative for hay fever and urticaria    Objective:  Blood pressure 126/85, pulse 81, height 5\' 4"  (1.626 m), weight 133 lb 11.2 oz (60.6 kg), last menstrual period 09/18/2018. Body mass index is 22.95 kg/m.   General Appearance:    Alert, cooperative, no distress, appears stated age  Head:    Normocephalic, without obvious abnormality.  Bruising of right cheek.   Eyes:    PERRL, conjunctiva/corneas clear, EOM's intact, both eyes  Ears:    Normal external ear canals, both ears  Nose:   Nares normal, septum midline, mucosa normal, no drainage or sinus tenderness  Throat:   Lips, mucosa, and tongue normal; teeth and gums normal  Neck:   Supple, symmetrical, trachea midline, no adenopathy; thyroid: no enlargement/tenderness/nodules; no carotid bruit or JVD  Back:     Symmetric, no curvature, ROM normal, no CVA tenderness  Lungs:     Clear to auscultation bilaterally, respirations unlabored  Chest Wall:    No tenderness or deformity   Heart:    Regular rate and rhythm, S1 and S2 normal, no murmur, rub or gallop  Breast Exam:    No tenderness, masses, or nipple abnormality. Fibrocystic changes noted bilaterally.   Abdomen:     Soft, non-tender, bowel sounds active all four quadrants, no masses, no organomegaly.    Genitalia:     Pelvic:external genitalia normal, vagina with lesions, discharge, or tenderness, rectovaginal septum  normal. Cervix normal in appearance, no cervical motion tenderness.  Uterus normal size, shape, consistency, mobile.  No adnexal masses or tenderness.    Rectal:    Normal external sphincter.  No hemorrhoids appreciated. Internal exam not done.   Extremities:   Extremities normal, no cyanosis or edema.  Few scratches on right knee.   Pulses:   2+ and symmetric all extremities  Skin:   Skin color, texture, turgor normal, no rashes or lesions  Lymph nodes:   Cervical, supraclavicular, and  axillary nodes normal  Neurologic:   CNII-XII intact, normal strength, sensation and reflexes throughout     Labs:  Lab Results  Component Value Date   WBC 4.9 09/28/2017   HGB 13.4 09/28/2017   HCT 40.1 09/28/2017   MCV 94 09/28/2017   PLT 217 09/28/2017     Lab Results  Component Value Date   CREATININE 0.83 09/28/2017   BUN 9 09/28/2017   NA 140 09/28/2017   K 4.3 09/28/2017   CL 103 09/28/2017   CO2 23 09/28/2017    Lab Results  Component Value Date   ALT 11 09/28/2017   AST 17 09/28/2017   ALKPHOS 50 09/28/2017   BILITOT 0.6 09/28/2017    Lab Results  Component Value Date   CHOL 175 10/02/2016   HDL 58 10/02/2016   LDLCALC 104 (H) 10/02/2016   TRIG 65 10/02/2016   CHOLHDL 3.0 10/02/2016    Lab Results  Component Value Date   TSH 1.530 09/25/2016      Assessment:    Healthy female exam.  Fibrocystic breast changes  Plan:  Blood tests: Complete metabolic panel, Lipid panel.  Breast self exam technique reviewed and patient encouraged to perform self-exam monthly. Discussed healthy lifestyle management.  Encouraged exercise and maintaining healthy balanced diet.  Pap smear up to date.  Due in 2021.  Contraception: vasectomy. Mammogram ordered.   Declines flu vaccine.  Follow up in 1 year.    Rubie Maid, MD [facility]

## 2018-09-29 NOTE — Progress Notes (Signed)
PT is present today for her annual exam. Pt stated that she has not been doing self-breast exams monthly. Pt stated that she is doing well and denies any issues. Pt stated that she fell on Tuesday and went to the urgent care due to she fell and hit her face. Pt stated that she is still sore from the fall. No other complaints.

## 2018-09-30 LAB — COMPREHENSIVE METABOLIC PANEL
ALT: 13 IU/L (ref 0–32)
AST: 15 IU/L (ref 0–40)
Albumin/Globulin Ratio: 2.6 — ABNORMAL HIGH (ref 1.2–2.2)
Albumin: 4.2 g/dL (ref 3.5–5.5)
Alkaline Phosphatase: 37 IU/L — ABNORMAL LOW (ref 39–117)
BUN/Creatinine Ratio: 15 (ref 9–23)
BUN: 11 mg/dL (ref 6–24)
Bilirubin Total: 0.5 mg/dL (ref 0.0–1.2)
CALCIUM: 8.6 mg/dL — AB (ref 8.7–10.2)
CO2: 22 mmol/L (ref 20–29)
CREATININE: 0.72 mg/dL (ref 0.57–1.00)
Chloride: 103 mmol/L (ref 96–106)
GFR calc Af Amer: 117 mL/min/{1.73_m2} (ref >59)
GFR, EST NON AFRICAN AMERICAN: 101 mL/min/{1.73_m2} (ref >59)
GLOBULIN, TOTAL: 1.6 g/dL (ref 1.5–4.5)
GLUCOSE: 75 mg/dL (ref 65–99)
Potassium: 4.2 mmol/L (ref 3.5–5.2)
SODIUM: 140 mmol/L (ref 134–144)
Total Protein: 5.8 g/dL — ABNORMAL LOW (ref 6.0–8.5)

## 2018-09-30 LAB — LIPID PANEL
Chol/HDL Ratio: 2.4 ratio (ref 0.0–4.4)
Cholesterol, Total: 163 mg/dL (ref 100–199)
HDL: 67 mg/dL (ref >39)
LDL Calculated: 83 mg/dL (ref 0–99)
Triglycerides: 66 mg/dL (ref 0–149)
VLDL CHOLESTEROL CAL: 13 mg/dL (ref 5–40)

## 2018-11-09 ENCOUNTER — Ambulatory Visit
Admission: RE | Admit: 2018-11-09 | Discharge: 2018-11-09 | Disposition: A | Discharge status: Home / Self Care | Payer: BLUE CROSS/BLUE SHIELD | Source: Ambulatory Visit | Attending: Obstetrics and Gynecology | Admitting: Obstetrics and Gynecology

## 2018-11-09 DIAGNOSIS — Z1231 Encounter for screening mammogram for malignant neoplasm of breast: Secondary | ICD-10-CM | POA: Diagnosis not present

## 2018-11-09 DIAGNOSIS — Z01419 Encounter for gynecological examination (general) (routine) without abnormal findings: Secondary | ICD-10-CM

## 2019-02-23 DIAGNOSIS — R21 Rash and other nonspecific skin eruption: Secondary | ICD-10-CM | POA: Diagnosis not present

## 2019-02-23 DIAGNOSIS — Z1283 Encounter for screening for malignant neoplasm of skin: Secondary | ICD-10-CM | POA: Diagnosis not present

## 2019-02-23 DIAGNOSIS — D229 Melanocytic nevi, unspecified: Secondary | ICD-10-CM | POA: Diagnosis not present

## 2019-02-23 DIAGNOSIS — L578 Other skin changes due to chronic exposure to nonionizing radiation: Secondary | ICD-10-CM | POA: Diagnosis not present

## 2019-02-23 DIAGNOSIS — L820 Inflamed seborrheic keratosis: Secondary | ICD-10-CM | POA: Diagnosis not present

## 2019-05-03 DIAGNOSIS — S6991XA Unspecified injury of right wrist, hand and finger(s), initial encounter: Secondary | ICD-10-CM | POA: Diagnosis not present

## 2019-05-03 DIAGNOSIS — M79641 Pain in right hand: Secondary | ICD-10-CM | POA: Diagnosis not present

## 2019-05-03 DIAGNOSIS — X58XXXA Exposure to other specified factors, initial encounter: Secondary | ICD-10-CM | POA: Diagnosis not present

## 2019-08-24 ENCOUNTER — Encounter: Payer: BLUE CROSS/BLUE SHIELD | Admitting: Obstetrics and Gynecology

## 2019-09-04 ENCOUNTER — Other Ambulatory Visit: Payer: Self-pay

## 2019-09-04 DIAGNOSIS — Z20822 Contact with and (suspected) exposure to covid-19: Secondary | ICD-10-CM

## 2019-09-04 DIAGNOSIS — Z20828 Contact with and (suspected) exposure to other viral communicable diseases: Secondary | ICD-10-CM | POA: Diagnosis not present

## 2019-09-05 LAB — NOVEL CORONAVIRUS, NAA: SARS-CoV-2, NAA: NOT DETECTED

## 2019-09-08 ENCOUNTER — Ambulatory Visit (INDEPENDENT_AMBULATORY_CARE_PROVIDER_SITE_OTHER): Payer: BC Managed Care – PPO | Admitting: Obstetrics and Gynecology

## 2019-09-08 ENCOUNTER — Other Ambulatory Visit: Payer: Self-pay

## 2019-09-08 ENCOUNTER — Encounter: Payer: Self-pay | Admitting: Obstetrics and Gynecology

## 2019-09-08 VITALS — BP 116/76 | HR 74 | Ht 64.0 in | Wt 137.9 lb

## 2019-09-08 DIAGNOSIS — N6011 Diffuse cystic mastopathy of right breast: Secondary | ICD-10-CM | POA: Diagnosis not present

## 2019-09-08 DIAGNOSIS — N951 Menopausal and female climacteric states: Secondary | ICD-10-CM

## 2019-09-08 DIAGNOSIS — Z1231 Encounter for screening mammogram for malignant neoplasm of breast: Secondary | ICD-10-CM | POA: Diagnosis not present

## 2019-09-08 DIAGNOSIS — N6012 Diffuse cystic mastopathy of left breast: Secondary | ICD-10-CM

## 2019-09-08 DIAGNOSIS — Z01419 Encounter for gynecological examination (general) (routine) without abnormal findings: Secondary | ICD-10-CM | POA: Diagnosis not present

## 2019-09-08 NOTE — Progress Notes (Signed)
GYNECOLOGY ANNUAL PHYSICAL EXAM PROGRESS NOTE  Subjective:    Consepcion May Salas is a 48 y.o. [id number] female who presents for an annual exam. The patient has no complaints today.  The patient is sexually active. The patient wears seatbelts: yes. The patient participates in regular exercise: no. Has the patient ever been transfused or tattooed?: no. The patient reports that there is not domestic violence in her life.     Menstrual History: Menarche age: 17 Patient's last menstrual period was 09/03/2019.  Reports irregular occuring menses, can occur twice in a month occasionally, lasting 4-5 days, light flow. Also reports mild menstrual headaches.    Gynecologic History:  Last pap: approximate date 09/28/2017 and was normal. Denies h/o abnormal pap smears or STIs.  Last mammogram: approximate date 10/2018 and was normal.  Contraception: female vasectomy Last mammogram: 11/09/2018.  Results were normal.    OB History  Gravida Para Term Preterm AB Living  3 3 3     3   SAB TAB Ectopic Multiple Live Births          3    # Outcome Date GA Lbr Len/2nd Weight Sex Delivery Anes PTL Lv  3 Term 2002   6 lb 10 oz (3.005 kg) M Vag-Spont   LIV  2 Term 2000   6 lb 13 oz (3.09 kg) M Vag-Spont   LIV  1 Term 1998   7 lb 11 oz (3.487 kg) M Vag-Spont   LIV    Past Medical History:  Diagnosis Date  . Boil, labium 33/2016  . Other abnormal blood chemistry    Iron,serum, elevated  . Pneumonia 11/03   outpatient    History reviewed. No pertinent surgical history.  Family History  Problem Relation Age of Onset  . Hypertension Father   . COPD Father   . Hypertension Sister   . AVM Sister        with bleed (at [facility])  . Colon cancer Maternal Grandmother   . Breast cancer Paternal Grandmother   . Dementia Mother     Social History   Socioeconomic History  . Marital status: Married    Spouse name: Not on file  . Number of children: 3  . Years of education: Not on file  . Highest  education level: Not on file  Occupational History  . Occupation: Programme researcher, broadcasting/film/video at [employer]  . Financial resource strain: Not on file  . Food insecurity    Worry: Not on file    Inability: Not on file  . Transportation needs    Medical: Not on file    Non-medical: Not on file  Tobacco Use  . Smoking status: Never Smoker  . Smokeless tobacco: Never Used  Substance and Sexual Activity  . Alcohol use: Yes    Alcohol/week: 1.0 standard drinks    Types: 1 Glasses of wine per week  . Drug use: No  . Sexual activity: Yes    Birth control/protection: None  Lifestyle  . Physical activity    Days per week: Not on file    Minutes per session: Not on file  . Stress: Not on file  Relationships  . Social Herbalist on phone: Not on file    Gets together: Not on file    Attends religious service: Not on file    Active member of club or organization: Not on file    Attends meetings of clubs or organizations: Not  on file    Relationship status: Not on file  . Intimate partner violence    Fear of current or ex partner: Not on file    Emotionally abused: Not on file    Physically abused: Not on file    Forced sexual activity: Not on file  Other Topics Concern  . 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   No current facility-administered medications on file prior to visit.     No Known Allergies   Review of Systems Constitutional: negative for chills, fatigue, fevers and sweats, weight gain or weight loss.  Eyes: negative for irritation, redness and visual disturbance Ears, nose, mouth, throat, and face: negative for hearing loss, nasal congestion, snoring and tinnitus Respiratory: negative for asthma, cough, sputum Cardiovascular: negative for chest pain, dyspnea, exertional chest pressure/discomfort, irregular heart  beat, palpitations and syncope Gastrointestinal: negative for abdominal pain, change in bowel habits, nausea and vomiting Genitourinary: negative for abnormal menstrual periods, genital lesions, sexual problems and vaginal discharge, dysuria and urinary incontinence Integument/breast: negative for breast lump, breast tenderness and nipple discharge Hematologic/lymphatic: negative for bleeding and easy bruising Musculoskeletal:negative for back pain and muscle weakness Neurological: negative for dizziness, headaches, vertigo and weakness Endocrine: negative for diabetic symptoms including polydipsia, polyuria and skin dryness Allergic/Immunologic: negative for hay fever and urticaria    Objective:  Blood pressure 116/76, pulse 74, height 5\' 4"  (1.626 m), weight 137 lb 14.4 oz (62.6 kg), last menstrual period 09/03/2019.  General Appearance:    Alert, cooperative, no distress, appears stated age  Head:    Normocephalic, without obvious abnormality, atraumatic  Eyes:    PERRL, conjunctiva/corneas clear, EOM's intact, both eyes  Ears:    Normal external ear canals, both ears  Nose:   Nares normal, septum midline, mucosa normal, no drainage or sinus tenderness  Throat:   Lips, mucosa, and tongue normal; teeth and gums normal  Neck:   Supple, symmetrical, trachea midline, no adenopathy; thyroid: no enlargement/tenderness/nodules; no carotid bruit or JVD  Back:     Symmetric, no curvature, ROM normal, no CVA tenderness  Lungs:     Clear to auscultation bilaterally, respirations unlabored  Chest Wall:    No tenderness or deformity   Heart:    Regular rate and rhythm, S1 and S2 normal, no murmur, rub or gallop  Breast Exam:    No tenderness, masses, or nipple abnormality. Fibrocystic changes noted in breasts bilaterally, L>R  Abdomen:     Soft, non-tender, bowel sounds active all four quadrants, no masses, no organomegaly.    Genitalia:    Pelvic:external genitalia normal, vagina with lesions,  discharge, or tenderness, rectovaginal septum  normal. Cervix normal in appearance, no cervical motion tenderness.  Uterus normal size, shape, consistency, mobile.  No adnexal masses or tenderness.    Rectal:    Normal external sphincter.  No hemorrhoids appreciated. Internal exam not done.   Extremities:   Extremities normal, atraumatic, no cyanosis or edema  Pulses:   2+ and symmetric all extremities  Skin:   Skin color, texture, turgor normal, no rashes or lesions  Lymph nodes:   Cervical, supraclavicular, and axillary nodes normal  Neurologic:   CNII-XII intact, normal strength, sensation and reflexes throughout     Labs:  Lab Results  Component Value Date   WBC 4.9 09/28/2017   HGB 13.4 09/28/2017   HCT 40.1 09/28/2017   MCV 94 09/28/2017   PLT 217 09/28/2017     Lab Results  Component Value Date   CREATININE 0.72 09/29/2018   BUN 11 09/29/2018   NA 140 09/29/2018   K 4.2 09/29/2018   CL 103 09/29/2018   CO2 22 09/29/2018    Lab Results  Component Value Date   ALT 13 09/29/2018   AST 15 09/29/2018   ALKPHOS 37 (L) 09/29/2018   BILITOT 0.5 09/29/2018    Lab Results  Component Value Date   CHOL 163 09/29/2018   HDL 67 09/29/2018   LDLCALC 83 09/29/2018   TRIG 66 09/29/2018   CHOLHDL 2.4 09/29/2018    Lab Results  Component Value Date   TSH 1.530 09/25/2016      Assessment:   Healthy female exam.  Breast cancer screening Fibrocystic changes of breast Perimenopausal  Plan:  Blood tests: Complete metabolic panel, CBC with diff, TSH.  Breast self exam technique reviewed and patient encouraged to perform self-exam monthly. Discussed healthy lifestyle management.  Encouraged exercise and maintaining healthy balanced diet.  Pap smear up to date.  Patient with occasional irregular cycles, likely perimenopausal.  Does not affect quality of life, bleeding not heavy. Patient to f/u if bleeding worsens.  Contraception: vasectomy. Mammogram ordered.    Declines flu vaccine Follow up in 1 year.   Rubie Maid, MD [facility]

## 2019-09-08 NOTE — Progress Notes (Signed)
Pt is present for annual exam. Pt stated that she was doing well. Pt declined flu vaccine.

## 2019-09-08 NOTE — Patient Instructions (Addendum)
Preventive Care 40-46 Years Old, Female Preventive care refers to visits with your health care provider and lifestyle choices that can promote health and wellness. This includes:  A yearly physical exam. This may also be called an annual well check.  Regular dental visits and eye exams.  Immunizations.  Screening for certain conditions.  Healthy lifestyle choices, such as eating a healthy diet, getting regular exercise, not using drugs or products that contain nicotine and tobacco, and limiting alcohol use. What can I expect for my preventive care visit? Physical exam Your health care provider will check your:  Height and weight. This may be used to calculate body mass index (BMI), which tells if you are at a healthy weight.  Heart rate and blood pressure.  Skin for abnormal spots. Counseling Your health care provider may ask you questions about your:  Alcohol, tobacco, and drug use.  Emotional well-being.  Home and relationship well-being.  Sexual activity.  Eating habits.  Work and work environment.  Method of birth control.  Menstrual cycle.  Pregnancy history. What immunizations do I need?  Influenza (flu) vaccine  This is recommended every year. Tetanus, diphtheria, and pertussis (Tdap) vaccine  You may need a Td booster every 10 years. Varicella (chickenpox) vaccine  You may need this if you have not been vaccinated. Zoster (shingles) vaccine  You may need this after age 60. Measles, mumps, and rubella (MMR) vaccine  You may need at least one dose of MMR if you were born in 1957 or later. You may also need a second dose. Pneumococcal conjugate (PCV13) vaccine  You may need this if you have certain conditions and were not previously vaccinated. Pneumococcal polysaccharide (PPSV23) vaccine  You may need one or two doses if you smoke cigarettes or if you have certain conditions. Meningococcal conjugate (MenACWY) vaccine  You may need this if you  have certain conditions. Hepatitis A vaccine  You may need this if you have certain conditions or if you travel or work in places where you may be exposed to hepatitis A. Hepatitis B vaccine  You may need this if you have certain conditions or if you travel or work in places where you may be exposed to hepatitis B. Haemophilus influenzae type b (Hib) vaccine  You may need this if you have certain conditions. Human papillomavirus (HPV) vaccine  If recommended by your health care provider, you may need three doses over 6 months. You may receive vaccines as individual doses or as more than one vaccine together in one shot (combination vaccines). Talk with your health care provider about the risks and benefits of combination vaccines. What tests do I need? Blood tests  Lipid and cholesterol levels. These may be checked every 5 years, or more frequently if you are over 50 years old.  Hepatitis C test.  Hepatitis B test. Screening  Lung cancer screening. You may have this screening every year starting at age 55 if you have a 30-pack-year history of smoking and currently smoke or have quit within the past 15 years.  Colorectal cancer screening. All adults should have this screening starting at age 50 and continuing until age 75. Your health care provider may recommend screening at age 45 if you are at increased risk. You will have tests every 1-10 years, depending on your results and the type of screening test.  Diabetes screening. This is done by checking your blood sugar (glucose) after you have not eaten for a while (fasting). You may have this   done every 1-3 years.  Mammogram. This may be done every 1-2 years. Talk with your health care provider about when you should start having regular mammograms. This may depend on whether you have a family history of breast cancer.  BRCA-related cancer screening. This may be done if you have a family history of breast, ovarian, tubal, or peritoneal  cancers.  Pelvic exam and Pap test. This may be done every 3 years starting at age 23. Starting at age 86, this may be done every 5 years if you have a Pap test in combination with an HPV test. Other tests  Sexually transmitted disease (STD) testing.  Bone density scan. This is done to screen for osteoporosis. You may have this scan if you are at high risk for osteoporosis. Follow these instructions at home: Eating and drinking  Eat a diet that includes fresh fruits and vegetables, whole grains, lean protein, and low-fat dairy.  Take vitamin and mineral supplements as recommended by your health care provider.  Do not drink alcohol if: ? Your health care provider tells you not to drink. ? You are pregnant, may be pregnant, or are planning to become pregnant.  If you drink alcohol: ? Limit how much you have to 0-1 drink a day. ? Be aware of how much alcohol is in your drink. In the [place] one drink equals one 12 oz bottle of beer (355 mL), one 5 oz glass of wine (148 mL), or one 1 oz glass of hard liquor (44 mL). Lifestyle  Take daily care of your teeth and gums.  Stay active. Exercise for at least 30 minutes on 5 or more days each week.  Do not use any products that contain nicotine or tobacco, such as cigarettes, e-cigarettes, and chewing tobacco. If you need help quitting, ask your health care provider.  If you are sexually active, practice safe sex. Use a condom or other form of birth control (contraception) in order to prevent pregnancy and STIs (sexually transmitted infections).  If told by your health care provider, take low-dose aspirin daily starting at age 27. What's next?  Visit your health care provider once a year for a well check visit.  Ask your health care provider how often you should have your eyes and teeth checked.  Stay up to date on all vaccines. This information is not intended to replace advice given to you by your health care provider. Make sure you  discuss any questions you have with your health care provider. Document Released: 12/06/2015 Document Revised: 07/21/2018 Document Reviewed: 07/21/2018 Elsevier Patient Education  2020 [place] self-awareness is knowing how your breasts look and feel. Doing breast self-awareness is important. It allows you to catch a breast problem early while it is still small and can be treated. All women should do breast self-awareness, including women who have had breast implants. Tell your doctor if you notice a change in your breasts. What you need:  A mirror.  A well-lit room. How to do a breast self-exam A breast self-exam is one way to learn what is normal for your breasts and to check for changes. To do a breast self-exam: Look for changes  1. Take off all the clothes above your waist. 2. Stand in front of a mirror in a room with good lighting. 3. Put your hands on your hips. 4. Push your hands down. 5. Look at your breasts and nipples in the mirror to see if one breast or nipple looks  different from the other. Check to see if: ? The shape of one breast is different. ? The size of one breast is different. ? There are wrinkles, dips, and bumps in one breast and not the other. 6. Look at each breast for changes in the skin, such as: ? Redness. ? Scaly areas. 7. Look for changes in your nipples, such as: ? Liquid around the nipples. ? Bleeding. ? Dimpling. ? Redness. ? A change in where the nipples are. Feel for changes  1. Lie on your back on the floor. 2. Feel each breast. To do this, follow these steps: ? Pick a breast to feel. ? Put the arm closest to that breast above your head. ? Use your other arm to feel the nipple area of your breast. Feel the area with the pads of your three middle fingers by making small circles with your fingers. For the first circle, press lightly. For the second circle, press harder. For the third circle, press even harder.  ? Keep making circles with your fingers at the different pressures as you move down your breast. Stop when you feel your ribs. ? Move your fingers a little toward the center of your body. ? Start making circles with your fingers again, this time going up until you reach your collarbone. ? Keep making up-and-down circles until you reach your armpit. Remember to keep using the three pressures. ? Feel the other breast in the same way. 3. Sit or stand in the tub or shower. 4. With soapy water on your skin, feel each breast the same way you did in step 2 when you were lying on the floor. Write down what you find Writing down what you find can help you remember what to tell your doctor. Write down:  What is normal for each breast.  Any changes you find in each breast, including: ? The kind of changes you find. ? Whether you have pain. ? Size and location of any lumps.  When you last had your menstrual period. General tips  Check your breasts every month.  If you are breastfeeding, the best time to check your breasts is after you feed your baby or after you use a breast pump.  If you get menstrual periods, the best time to check your breasts is 5-7 days after your menstrual period is over.  With time, you will become comfortable with the self-exam, and you will begin to know if there are changes in your breasts. Contact a doctor if you:  See a change in the shape or size of your breasts or nipples.  See a change in the skin of your breast or nipples, such as red or scaly skin.  Have fluid coming from your nipples that is not normal.  Find a lump or thick area that was not there before.  Have pain in your breasts.  Have any concerns about your breast health. Summary  Breast self-awareness includes looking for changes in your breasts, as well as feeling for changes within your breasts.  Breast self-awareness should be done in front of a mirror in a well-lit room.  You should  check your breasts every month. If you get menstrual periods, the best time to check your breasts is 5-7 days after your menstrual period is over.  Let your doctor know of any changes you see in your breasts, including changes in size, changes on the skin, pain or tenderness, or fluid from your nipples that is not normal. This  This information is not intended to replace advice given to you by your health care provider. Make sure you discuss any questions you have with your health care provider. Document Released: 04/27/2008 Document Revised: 06/28/2018 Document Reviewed: 06/28/2018 Elsevier Patient Education  2020 Elsevier Inc.    Perimenopause  Perimenopause is the normal time of life before and after menstrual periods stop completely (menopause). Perimenopause can begin 2-8 years before menopause, and it usually lasts for 1 year after menopause. During perimenopause, the ovaries may or may not produce an egg. What are the causes? This condition is caused by a natural change in hormone levels that happens as you get older. What increases the risk? This condition is more likely to start at an earlier age if you have certain medical conditions or treatments, including:  A tumor of the pituitary gland in the brain.  A disease that affects the ovaries and hormone production.  Radiation treatment for cancer.  Certain cancer treatments, such as chemotherapy or hormone (anti-estrogen) therapy.  Heavy smoking and excessive alcohol use.  Family history of early menopause. What are the signs or symptoms? Perimenopausal changes affect each woman differently. Symptoms of this condition may include:  Hot flashes.  Night sweats.  Irregular menstrual periods.  Decreased sex drive.  Vaginal dryness.  Headaches.  Mood swings.  Depression.  Memory problems or trouble concentrating.  Irritability.  Tiredness.  Weight gain.  Anxiety.  Trouble getting pregnant. How is this  diagnosed? This condition is diagnosed based on your medical history, a physical exam, your age, your menstrual history, and your symptoms. Hormone tests may also be done. How is this treated? In some cases, no treatment is needed. You and your health care provider should make a decision together about whether treatment is necessary. Treatment will be based on your individual condition and preferences. Various treatments are available, such as:  Menopausal hormone therapy (MHT).  Medicines to treat specific symptoms.  Acupuncture.  Vitamin or herbal supplements. Before starting treatment, make sure to let your health care provider know if you have a personal or family history of:  Heart disease.  Breast cancer.  Blood clots.  Diabetes.  Osteoporosis. Follow these instructions at home: Lifestyle  Do not use any products that contain nicotine or tobacco, such as cigarettes and e-cigarettes. If you need help quitting, ask your health care provider.  Eat a balanced diet that includes fresh fruits and vegetables, whole grains, soybeans, eggs, lean meat, and low-fat dairy.  Get at least 30 minutes of physical activity on 5 or more days each week.  Avoid alcoholic and caffeinated beverages, as well as spicy foods. This may help prevent hot flashes.  Get 7-8 hours of sleep each night.  Dress in layers that can be removed to help you manage hot flashes.  Find ways to manage stress, such as deep breathing, meditation, or journaling. General instructions  Keep track of your menstrual periods, including: ? When they occur. ? How heavy they are and how long they last. ? How much time passes between periods.  Keep track of your symptoms, noting when they start, how often you have them, and how long they last.  Take over-the-counter and prescription medicines only as told by your health care provider.  Take vitamin supplements only as told by your health care provider. These may  include calcium, vitamin E, and vitamin D.  Use vaginal lubricants or moisturizers to help with vaginal dryness and improve comfort during sex.  Talk with your   health care provider before starting any herbal supplements.  Keep all follow-up visits as told by your health care provider. This is important. This includes any group therapy or counseling. Contact a health care provider if:  You have heavy vaginal bleeding or pass blood clots.  Your period lasts more than 2 days longer than normal.  Your periods are recurring sooner than 21 days.  You bleed after having sex. Get help right away if:  You have chest pain, trouble breathing, or trouble talking.  You have severe depression.  You have pain when you urinate.  You have severe headaches.  You have vision problems. Summary  Perimenopause is the time when a woman's body begins to move into menopause. This may happen naturally or as a result of other health problems or medical treatments.  Perimenopause can begin 2-8 years before menopause, and it usually lasts for 1 year after menopause.  Perimenopausal symptoms can be managed through medicines, lifestyle changes, and complementary therapies such as acupuncture. This information is not intended to replace advice given to you by your health care provider. Make sure you discuss any questions you have with your health care provider. Document Released: 12/17/2004 Document Revised: 10/22/2017 Document Reviewed: 12/15/2016 Elsevier Patient Education  2020 Elsevier Inc.   

## 2019-09-09 LAB — COMPREHENSIVE METABOLIC PANEL
ALT: 14 IU/L (ref 0–32)
AST: 18 IU/L (ref 0–40)
Albumin/Globulin Ratio: 2.4 — ABNORMAL HIGH (ref 1.2–2.2)
Albumin: 4.8 g/dL (ref 3.8–4.8)
Alkaline Phosphatase: 52 IU/L (ref 39–117)
BUN/Creatinine Ratio: 14 (ref 9–23)
BUN: 10 mg/dL (ref 6–24)
Bilirubin Total: 0.5 mg/dL (ref 0.0–1.2)
CO2: 22 mmol/L (ref 20–29)
Calcium: 9.5 mg/dL (ref 8.7–10.2)
Chloride: 101 mmol/L (ref 96–106)
Creatinine, Ser: 0.7 mg/dL (ref 0.57–1.00)
GFR calc Af Amer: 120 mL/min/{1.73_m2} (ref >59)
GFR calc non Af Amer: 104 mL/min/{1.73_m2} (ref >59)
Globulin, Total: 2 g/dL (ref 1.5–4.5)
Glucose: 70 mg/dL (ref 65–99)
Potassium: 4 mmol/L (ref 3.5–5.2)
Sodium: 138 mmol/L (ref 134–144)
Total Protein: 6.8 g/dL (ref 6.0–8.5)

## 2019-09-09 LAB — CBC
Hematocrit: 39.6 % (ref 34.0–46.6)
Hemoglobin: 13.9 g/dL (ref 11.1–15.9)
MCH: 32.3 pg (ref 26.6–33.0)
MCHC: 35.1 g/dL (ref 31.5–35.7)
MCV: 92 fL (ref 79–97)
Platelets: 240 10*3/uL (ref 150–450)
RBC: 4.3 x10E6/uL (ref 3.77–5.28)
RDW: 11.3 % — ABNORMAL LOW (ref 11.7–15.4)
WBC: 5.6 10*3/uL (ref 3.4–10.8)

## 2019-09-09 LAB — TSH: TSH: 1.36 u[IU]/mL (ref 0.450–4.500)

## 2019-11-13 ENCOUNTER — Ambulatory Visit
Admission: RE | Admit: 2019-11-13 | Discharge: 2019-11-13 | Disposition: A | Discharge status: Home / Self Care | Payer: BC Managed Care – PPO | Source: Ambulatory Visit | Attending: Obstetrics and Gynecology | Admitting: Obstetrics and Gynecology

## 2019-11-13 DIAGNOSIS — N6011 Diffuse cystic mastopathy of right breast: Secondary | ICD-10-CM | POA: Insufficient documentation

## 2019-11-13 DIAGNOSIS — N6012 Diffuse cystic mastopathy of left breast: Secondary | ICD-10-CM | POA: Insufficient documentation

## 2019-11-13 DIAGNOSIS — Z01419 Encounter for gynecological examination (general) (routine) without abnormal findings: Secondary | ICD-10-CM | POA: Diagnosis not present

## 2019-11-13 DIAGNOSIS — Z1231 Encounter for screening mammogram for malignant neoplasm of breast: Secondary | ICD-10-CM | POA: Insufficient documentation

## 2020-02-29 ENCOUNTER — Telehealth: Payer: Self-pay | Admitting: Family Medicine

## 2020-02-29 NOTE — Telephone Encounter (Signed)
May take a while to get in with me so reasonable to offer establishing with another provider if she prefers.

## 2020-02-29 NOTE — Telephone Encounter (Signed)
Pt called to schedule an appt. She used to be your patient but it has been over three years since she has been seen. Is it okay for her to re-establish with you or does she need to be scheduled with another provider?

## 2020-03-01 NOTE — Telephone Encounter (Signed)
New patient paperwork mailed to patient.

## 2020-03-01 NOTE — Telephone Encounter (Signed)
Patient said she wasn't in a hurry to be seen.  Patient said she hasn't seen another provider except for her gynecologist.  Patient scheduled appointment on 03/18/20. Do you want me to mail a new patient packet to patient?

## 2020-03-01 NOTE — Telephone Encounter (Signed)
Yes, plz.

## 2020-03-18 ENCOUNTER — Encounter: Payer: Self-pay | Admitting: Family Medicine

## 2020-03-18 ENCOUNTER — Other Ambulatory Visit: Payer: Self-pay

## 2020-03-18 ENCOUNTER — Ambulatory Visit (INDEPENDENT_AMBULATORY_CARE_PROVIDER_SITE_OTHER): Payer: BC Managed Care – PPO | Admitting: Family Medicine

## 2020-03-18 VITALS — BP 116/70 | HR 92 | Temp 97.6°F | Ht 63.25 in | Wt 137.0 lb

## 2020-03-18 DIAGNOSIS — M79641 Pain in right hand: Secondary | ICD-10-CM

## 2020-03-18 DIAGNOSIS — Z0000 Encounter for general adult medical examination without abnormal findings: Secondary | ICD-10-CM | POA: Diagnosis not present

## 2020-03-18 DIAGNOSIS — G8929 Other chronic pain: Secondary | ICD-10-CM | POA: Insufficient documentation

## 2020-03-18 DIAGNOSIS — Z1211 Encounter for screening for malignant neoplasm of colon: Secondary | ICD-10-CM

## 2020-03-18 MED ORDER — EMERGEN-C IMMUNE PO PACK
1.0000 | PACK | Freq: Every day | ORAL | Status: DC
Start: 2020-03-18 — End: 2021-09-11

## 2020-03-18 MED ORDER — IBUPROFEN 200 MG PO TABS: 600.0000 mg | ORAL_TABLET | Freq: Two times a day (BID) | ORAL | Status: AC | PRN

## 2020-03-18 NOTE — Assessment & Plan Note (Signed)
I suggested trial of voltaren gel and heat PRN

## 2020-03-18 NOTE — Progress Notes (Signed)
This visit was conducted in person.  BP 116/70 (BP Location: Left Arm, Patient Position: Sitting, Cuff Size: Normal)   Pulse 92   Temp 97.6 F (36.4 C) (Temporal)   Ht 5' 3.25" (1.607 m)   Wt 137 lb (62.1 kg)   LMP 02/28/2020   SpO2 99%   BMI 24.08 kg/m    CC: re establish care Subjective:    Patient ID: Dana Salas, female    DOB: Nov 23, 1971, 49 y.o.   MRN: [id number]  HPI: Dana Salas is a 49 y.o. female presenting on 03/18/2020 for New Patient (Here to re-establish care. )   Last seen 05/2014.  ?perimenopausal - cycles becoming more irregular. Notes more cramping as well. Notes worsening frontal HA with cycles associated with some lightheadedness managed with ibuprofen 644m bid. No h/o migraines. Sister with h/o AVM. No cerebral aneurysms in the family.   Chronic R hand pain at base of palm noted when she bears weight on palm (ie push ups). Has completed OT, had normal CT scan.   Preventative: Colon cancer screening - discussed, would interested in colonoscopy.  Well woman with OBGYN (Rubie Maid last 08/2019.  Mammo - 10/2019 yearly, normal - h/o fibrocystic breast disease Flu - declines COVID vaccine - has decided to defer for now  Td 2011 - will defer for now Seat belt use discussed Sunscreen use discussed. No changing moles on skin. Has seen derm.  Non smoker Alcohol - occasional Eye exam yearly Dentist - due  Lives with husband and 3 children (1 in college) Edu: bachelor's Occ: [employer]  Activity: some online exercise programs, no regular exercise Diet: good water, fruits/vegetables daily     Relevant past medical, surgical, family and social history reviewed and updated as indicated. Interim medical history since our last visit reviewed. Allergies and medications reviewed and updated. Outpatient Medications Prior to Visit  Medication Sig Dispense Refill  . cetirizine (ZYRTEC) 10 MG tablet Take 10 mg by mouth daily as  needed for allergies.     No facility-administered medications prior to visit.     Per HPI unless specifically indicated in ROS section below Review of Systems  Constitutional: Negative for activity change, appetite change, chills, fatigue, fever and unexpected weight change.  HENT: Negative for hearing loss.   Eyes: Negative for visual disturbance.  Respiratory: Negative for cough, chest tightness, shortness of breath and wheezing.   Cardiovascular: Positive for palpitations (occasional). Negative for chest pain and leg swelling.  Gastrointestinal: Negative for abdominal distention, abdominal pain, blood in stool, constipation, diarrhea, nausea and vomiting.  Genitourinary: Negative for difficulty urinating and hematuria.  Musculoskeletal: Negative for arthralgias, myalgias and neck pain.  Skin: Negative for rash.  Neurological: Positive for headaches (related to cycles). Negative for dizziness, seizures and syncope.  Hematological: Negative for adenopathy. Bruises/bleeds easily.  Psychiatric/Behavioral: Negative for dysphoric mood. The patient is not nervous/anxious.    Objective:    BP 116/70 (BP Location: Left Arm, Patient Position: Sitting, Cuff Size: Normal)   Pulse 92   Temp 97.6 F (36.4 C) (Temporal)   Ht 5' 3.25" (1.607 m)   Wt 137 lb (62.1 kg)   LMP 02/28/2020   SpO2 99%   BMI 24.08 kg/m   Wt Readings from Last 3 Encounters:  03/18/20 137 lb (62.1 kg)  09/08/19 137 lb 14.4 oz (62.6 kg)  09/29/18 133 lb 11.2 oz (60.6 kg)    Physical Exam Vitals and nursing note reviewed.  Constitutional:      General: She is not in acute distress.    Appearance: Normal appearance. She is well-developed. She is not ill-appearing.  HENT:     Head: Normocephalic and atraumatic.     Right Ear: Hearing, tympanic membrane, ear canal and external ear normal.     Left Ear: Hearing, tympanic membrane, ear canal and external ear normal.  Eyes:     General: No scleral icterus.     Extraocular Movements: Extraocular movements intact.     Conjunctiva/sclera: Conjunctivae normal.     Pupils: Pupils are equal, round, and reactive to light.  Cardiovascular:     Rate and Rhythm: Normal rate and regular rhythm.     Pulses: Normal pulses.          Radial pulses are 2+ on the right side and 2+ on the left side.     Heart sounds: Normal heart sounds. No murmur.  Pulmonary:     Effort: Pulmonary effort is normal. No respiratory distress.     Breath sounds: Normal breath sounds. No wheezing, rhonchi or rales.  Abdominal:     General: Abdomen is flat. Bowel sounds are normal. There is no distension.     Palpations: Abdomen is soft. There is no mass.     Tenderness: There is no abdominal tenderness. There is no guarding or rebound.     Hernia: No hernia is present.  Musculoskeletal:        General: Swelling present. Normal range of motion.     Cervical back: Normal range of motion and neck supple.     Right lower leg: No edema.     Left lower leg: No edema.     Comments: Mild soft tissue swelling at base of R palm, no reproducible tenderness to palpation   Lymphadenopathy:     Cervical: No cervical adenopathy.  Skin:    General: Skin is warm and dry.     Findings: No rash.  Neurological:     General: No focal deficit present.     Mental Status: She is alert and oriented to person, place, and time.     Comments: CN grossly intact, station and gait intact  Psychiatric:        Mood and Affect: Mood normal.        Behavior: Behavior normal.        Thought Content: Thought content normal.        Judgment: Judgment normal.       Results for orders placed or performed in visit on 09/08/19  TSH  Result Value Ref Range   TSH 1.360 0.450 - 4.500 uIU/mL  CBC  Result Value Ref Range   WBC 5.6 3.4 - 10.8 x10E3/uL   RBC 4.30 3.77 - 5.28 x10E6/uL   Hemoglobin 13.9 11.1 - 15.9 g/dL   Hematocrit 39.6 34.0 - 46.6 %   MCV 92 79 - 97 fL   MCH 32.3 26.6 - 33.0 pg   MCHC 35.1  31.5 - 35.7 g/dL   RDW 11.3 (L) 11.7 - 15.4 %   Platelets 240 150 - 450 x10E3/uL  Comp Met (CMET)  Result Value Ref Range   Glucose 70 65 - 99 mg/dL   BUN 10 6 - 24 mg/dL   Creatinine, Ser 0.70 0.57 - 1.00 mg/dL   GFR calc non Af Amer 104 >59 mL/min/1.73   GFR calc Af Amer 120 >59 mL/min/1.73   BUN/Creatinine Ratio 14 9 - 23   Sodium 138  134 - 144 mmol/L   Potassium 4.0 3.5 - 5.2 mmol/L   Chloride 101 96 - 106 mmol/L   CO2 22 20 - 29 mmol/L   Calcium 9.5 8.7 - 10.2 mg/dL   Total Protein 6.8 6.0 - 8.5 g/dL   Albumin 4.8 3.8 - 4.8 g/dL   Globulin, Total 2.0 1.5 - 4.5 g/dL   Albumin/Globulin Ratio 2.4 (H) 1.2 - 2.2   Bilirubin Total 0.5 0.0 - 1.2 mg/dL   Alkaline Phosphatase 52 39 - 117 IU/L   AST 18 0 - 40 IU/L   ALT 14 0 - 32 IU/L   Depression screen [facility] 2/9 03/18/2020  Decreased Interest 1  Down, Depressed, Hopeless 1  PHQ - 2 Score 2  Altered sleeping 0  Tired, decreased energy 1  Change in appetite 0  Feeling bad or failure about yourself  0  Trouble concentrating 0  Moving slowly or fidgety/restless 0  Suicidal thoughts 0  PHQ-9 Score 3    Assessment & Plan:  This visit occurred during the SARS-CoV-2 public health emergency.  Safety protocols were in place, including screening questions prior to the visit, additional usage of staff PPE, and extensive cleaning of exam room while observing appropriate contact time as indicated for disinfecting solutions.   Problem List Items Addressed This Visit    Health maintenance examination - Primary    Preventative protocols reviewed and updated unless pt declined. Discussed healthy diet and lifestyle.  Reviewed latestcolong cancer screening guidelines - she is interested in colonoscopy.       Chronic hand pain, right    I suggested trial of voltaren gel and heat PRN       Relevant Medications   ibuprofen (ADVIL) 200 MG tablet    Other Visit Diagnoses    Special screening for malignant neoplasms, colon       Relevant  Orders   Ambulatory referral to Gastroenterology       Meds ordered this encounter  Medications  . ibuprofen (ADVIL) 200 MG tablet    Sig: Take 3 tablets (600 mg total) by mouth 2 (two) times daily as needed for headache.  . Multiple Vitamins-Minerals (EMERGEN-C IMMUNE) PACK    Sig: Take 1 Package by mouth daily.   Orders Placed This Encounter  Procedures  . Ambulatory referral to Gastroenterology    Referral Priority:   Routine    Referral Type:   Consultation    Referral Reason:   Specialty Services Required    Number of Visits Requested:   1    Patient instructions: You are doing well today Pick up some votaren gel for R palm. Also try heat to area.  We will refer you for colonoscopy - check with insurance on coverage.  Return as needed or in 1-2 years for next physical.   Follow up plan: Return in about 1 year (around 03/18/2021) for annual exam, prior fasting for blood work.  Ria Bush, MD

## 2020-03-18 NOTE — Patient Instructions (Addendum)
You are doing well today Pick up some votaren gel for R palm. Also try heat to area.  We will refer you for colonoscopy - check with insurance on coverage.  Return as needed or in 1-2 years for next physical.   Health Maintenance, Female Adopting a healthy lifestyle and getting preventive care are important in promoting health and wellness. Ask your health care provider about:  The right schedule for you to have regular tests and exams.  Things you can do on your own to prevent diseases and keep yourself healthy. What should I know about diet, weight, and exercise? Eat a healthy diet   Eat a diet that includes plenty of vegetables, fruits, low-fat dairy products, and lean protein.  Do not eat a lot of foods that are high in solid fats, added sugars, or sodium. Maintain a healthy weight Body mass index (BMI) is used to identify weight problems. It estimates body fat based on height and weight. Your health care provider can help determine your BMI and help you achieve or maintain a healthy weight. Get regular exercise Get regular exercise. This is one of the most important things you can do for your health. Most adults should:  Exercise for at least 150 minutes each week. The exercise should increase your heart rate and make you sweat (moderate-intensity exercise).  Do strengthening exercises at least twice a week. This is in addition to the moderate-intensity exercise.  Spend less time sitting. Even light physical activity can be beneficial. Watch cholesterol and blood lipids Have your blood tested for lipids and cholesterol at 47 years of age, then have this test every 5 years. Have your cholesterol levels checked more often if:  Your lipid or cholesterol levels are high.  You are older than 47 years of age.  You are at high risk for heart disease. What should I know about cancer screening? Depending on your health history and family history, you may need to have cancer  screening at various ages. This may include screening for:  Breast cancer.  Cervical cancer.  Colorectal cancer.  Skin cancer.  Lung cancer. What should I know about heart disease, diabetes, and high blood pressure? Blood pressure and heart disease  High blood pressure causes heart disease and increases the risk of stroke. This is more likely to develop in people who have high blood pressure readings, are of African descent, or are overweight.  Have your blood pressure checked: ? Every 3-5 years if you are 87-68 years of age. ? Every year if you are 66 years old or older. Diabetes Have regular diabetes screenings. This checks your fasting blood sugar level. Have the screening done:  Once every three years after age 24 if you are at a normal weight and have a low risk for diabetes.  More often and at a younger age if you are overweight or have a high risk for diabetes. What should I know about preventing infection? Hepatitis B If you have a higher risk for hepatitis B, you should be screened for this virus. Talk with your health care provider to find out if you are at risk for hepatitis B infection. Hepatitis C Testing is recommended for:  Everyone born from [birth year] through 1963.  Anyone with known risk factors for hepatitis C. Sexually transmitted infections (STIs)  Get screened for STIs, including gonorrhea and chlamydia, if: ? You are sexually active and are younger than 47 years of age. ? You are older than 47 years of age  and your health care provider tells you that you are at risk for this type of infection. ? Your sexual activity has changed since you were last screened, and you are at increased risk for chlamydia or gonorrhea. Ask your health care provider if you are at risk.  Ask your health care provider about whether you are at high risk for HIV. Your health care provider may recommend a prescription medicine to help prevent HIV infection. If you choose to take  medicine to prevent HIV, you should first get tested for HIV. You should then be tested every 3 months for as long as you are taking the medicine. Pregnancy  If you are about to stop having your period (premenopausal) and you may become pregnant, seek counseling before you get pregnant.  Take 400 to 800 micrograms (mcg) of folic acid every day if you become pregnant.  Ask for birth control (contraception) if you want to prevent pregnancy. Osteoporosis and menopause Osteoporosis is a disease in which the bones lose minerals and strength with aging. This can result in bone fractures. If you are 37 years old or older, or if you are at risk for osteoporosis and fractures, ask your health care provider if you should:  Be screened for bone loss.  Take a calcium or vitamin D supplement to lower your risk of fractures.  Be given hormone replacement therapy (HRT) to treat symptoms of menopause. Follow these instructions at home: Lifestyle  Do not use any products that contain nicotine or tobacco, such as cigarettes, e-cigarettes, and chewing tobacco. If you need help quitting, ask your health care provider.  Do not use street drugs.  Do not share needles.  Ask your health care provider for help if you need support or information about quitting drugs. Alcohol use  Do not drink alcohol if: ? Your health care provider tells you not to drink. ? You are pregnant, may be pregnant, or are planning to become pregnant.  If you drink alcohol: ? Limit how much you use to 0-1 drink a day. ? Limit intake if you are breastfeeding.  Be aware of how much alcohol is in your drink. In the [place] one drink equals one 12 oz bottle of beer (355 mL), one 5 oz glass of wine (148 mL), or one 1 oz glass of hard liquor (44 mL). General instructions  Schedule regular health, dental, and eye exams.  Stay current with your vaccines.  Tell your health care provider if: ? You often feel depressed. ? You have  ever been abused or do not feel safe at home. Summary  Adopting a healthy lifestyle and getting preventive care are important in promoting health and wellness.  Follow your health care provider's instructions about healthy diet, exercising, and getting tested or screened for diseases.  Follow your health care provider's instructions on monitoring your cholesterol and blood pressure. This information is not intended to replace advice given to you by your health care provider. Make sure you discuss any questions you have with your health care provider. Document Revised: 11/02/2018 Document Reviewed: 11/02/2018 Elsevier Patient Education  2020 [employer].

## 2020-03-18 NOTE — Assessment & Plan Note (Addendum)
Preventative protocols reviewed and updated unless pt declined. Discussed healthy diet and lifestyle.  Reviewed latestcolong cancer screening guidelines - she is interested in colonoscopy.

## 2020-03-27 ENCOUNTER — Telehealth (INDEPENDENT_AMBULATORY_CARE_PROVIDER_SITE_OTHER): Payer: Self-pay | Admitting: Gastroenterology

## 2020-03-27 DIAGNOSIS — Z1211 Encounter for screening for malignant neoplasm of colon: Secondary | ICD-10-CM

## 2020-03-27 NOTE — Progress Notes (Signed)
Gastroenterology Pre-Procedure Review  Request Date: 04/15/20 Requesting Physician: Dr. Bonna Gains  PATIENT REVIEW QUESTIONS: The patient responded to the following health history questions as indicated:    1. Are you having any GI issues? no 2. Do you have a personal history of Polyps? no 3. Do you have a family history of Colon Cancer or Polyps? no 4. Diabetes Mellitus? no 5. Joint replacements in the past 12 months?no 6. Major health problems in the past 3 months?no 7. Any artificial heart valves, MVP, or defibrillator?no    MEDICATIONS & ALLERGIES:    Patient reports the following regarding taking any anticoagulation/antiplatelet therapy:   Plavix, Coumadin, Eliquis, Xarelto, Lovenox, Pradaxa, Brilinta, or Effient? no Aspirin? no  Patient confirms/reports the following medications:  Current Outpatient Medications  Medication Sig Dispense Refill  . cetirizine (ZYRTEC) 10 MG tablet Take 10 mg by mouth daily as needed for allergies.    Marland Kitchen ibuprofen (ADVIL) 200 MG tablet Take 3 tablets (600 mg total) by mouth 2 (two) times daily as needed for headache. (Patient not taking: Reported on 03/27/2020)    . Multiple Vitamins-Minerals (EMERGEN-C IMMUNE) PACK Take 1 Package by mouth daily. (Patient not taking: Reported on 03/27/2020)     No current facility-administered medications for this visit.    Patient confirms/reports the following allergies:  No Known Allergies  No orders of the defined types were placed in this encounter.   AUTHORIZATION INFORMATION Primary Insurance: 1D#: Group #:  Secondary Insurance: 1D#: Group #:  SCHEDULE INFORMATION: Date: 04/15/20 Time: Location:[facility]

## 2020-04-11 ENCOUNTER — Other Ambulatory Visit: Payer: Self-pay

## 2020-04-11 ENCOUNTER — Other Ambulatory Visit
Admission: RE | Admit: 2020-04-11 | Discharge: 2020-04-11 | Disposition: A | Discharge status: Home / Self Care | Payer: BC Managed Care – PPO | Source: Ambulatory Visit | Attending: Gastroenterology | Admitting: Gastroenterology

## 2020-04-11 DIAGNOSIS — Z20822 Contact with and (suspected) exposure to covid-19: Secondary | ICD-10-CM | POA: Insufficient documentation

## 2020-04-11 DIAGNOSIS — Z01812 Encounter for preprocedural laboratory examination: Secondary | ICD-10-CM | POA: Diagnosis not present

## 2020-04-11 LAB — SARS CORONAVIRUS 2 (TAT 6-24 HRS): SARS Coronavirus 2: NEGATIVE

## 2020-04-15 ENCOUNTER — Encounter
Admission: RE | Disposition: A | Discharge status: Home / Self Care | Payer: Self-pay | Source: Home / Self Care | Attending: Gastroenterology

## 2020-04-15 ENCOUNTER — Other Ambulatory Visit: Payer: Self-pay

## 2020-04-15 ENCOUNTER — Ambulatory Visit: Payer: BC Managed Care – PPO | Admitting: Registered Nurse

## 2020-04-15 ENCOUNTER — Encounter: Payer: Self-pay | Admitting: Gastroenterology

## 2020-04-15 ENCOUNTER — Ambulatory Visit
Admission: RE | Admit: 2020-04-15 | Discharge: 2020-04-15 | Disposition: A | Discharge status: Home / Self Care | Payer: BC Managed Care – PPO | Attending: Gastroenterology | Admitting: Gastroenterology

## 2020-04-15 DIAGNOSIS — D126 Benign neoplasm of colon, unspecified: Secondary | ICD-10-CM | POA: Diagnosis not present

## 2020-04-15 DIAGNOSIS — K635 Polyp of colon: Secondary | ICD-10-CM | POA: Diagnosis not present

## 2020-04-15 DIAGNOSIS — Z1211 Encounter for screening for malignant neoplasm of colon: Secondary | ICD-10-CM | POA: Diagnosis not present

## 2020-04-15 DIAGNOSIS — D123 Benign neoplasm of transverse colon: Secondary | ICD-10-CM | POA: Insufficient documentation

## 2020-04-15 HISTORY — PX: COLONOSCOPY WITH PROPOFOL: SHX5780

## 2020-04-15 LAB — POCT PREGNANCY, URINE: Preg Test, Ur: NEGATIVE

## 2020-04-15 SURGERY — COLONOSCOPY WITH PROPOFOL
Anesthesia: General

## 2020-04-15 MED ORDER — PROPOFOL 500 MG/50ML IV EMUL
INTRAVENOUS | Status: DC | PRN
  Administered 2020-04-15: 150 ug/kg/min via INTRAVENOUS

## 2020-04-15 MED ORDER — LIDOCAINE HCL (CARDIAC) PF 100 MG/5ML IV SOSY
PREFILLED_SYRINGE | INTRAVENOUS | Status: DC | PRN
  Administered 2020-04-15: 40 mg via INTRAVENOUS

## 2020-04-15 MED ORDER — PHENYLEPHRINE HCL (PRESSORS) 10 MG/ML IV SOLN
INTRAVENOUS | Status: DC | PRN
  Administered 2020-04-15: 100 ug via INTRAVENOUS

## 2020-04-15 MED ORDER — PROPOFOL 10 MG/ML IV BOLUS
INTRAVENOUS | Status: DC | PRN
  Administered 2020-04-15: 80 mg via INTRAVENOUS
  Administered 2020-04-15: 20 mg via INTRAVENOUS

## 2020-04-15 MED ORDER — SODIUM CHLORIDE 0.9 % IV SOLN: INTRAVENOUS | Status: DC

## 2020-04-15 NOTE — Transfer of Care (Signed)
Immediate Anesthesia Transfer of Care Note  Patient: Dana Salas  Procedure(s) Performed: COLONOSCOPY WITH PROPOFOL (N/A )  Patient Location: PACU and Endoscopy Unit  Anesthesia Type:General  Level of Consciousness: drowsy  Airway & Oxygen Therapy: Patient Spontanous Breathing  Post-op Assessment: Report given to RN and Post -op Vital signs reviewed and stable  Post vital signs: Reviewed and stable  Last Vitals:  Vitals Value Taken Time  BP 83/57 04/15/20 0943  Temp 36.7 C 04/15/20 0942  Pulse 71 04/15/20 0945  Resp 32 04/15/20 0945  SpO2 100 % 04/15/20 0945  Vitals shown include unvalidated device data.  Last Pain:  Vitals:   04/15/20 0942  TempSrc: Temporal  PainSc: Asleep         Complications: No apparent anesthesia complications

## 2020-04-15 NOTE — Op Note (Signed)
[facility] Patient Name: Dana Salas Procedure Date: 04/15/2020 9:03 AM MRN: [id number] Account #: [id number] Date of Birth: [birth year] Admit Type: Outpatient Age: 49 Room: [facility] ENDO ROOM 2 Gender: Female Note Status: Finalized Procedure:             Colonoscopy Indications:           Screening for colorectal malignant neoplasm Providers:             Varnita B. Bonna Gains MD, MD Referring MD:          Ria Bush (Referring MD) Medicines:             Monitored Anesthesia Care Complications:         No immediate complications. Procedure:             Pre-Anesthesia Assessment:                        - ASA Grade Assessment: II - A patient with mild                         systemic disease.                        - Prior to the procedure, a History and Physical was                         performed, and patient medications, allergies and                         sensitivities were reviewed. The patient's tolerance                         of previous anesthesia was reviewed.                        - The risks and benefits of the procedure and the                         sedation options and risks were discussed with the                         patient. All questions were answered and informed                         consent was obtained.                        - Patient identification and proposed procedure were                         verified prior to the procedure by the physician, the                         nurse, the anesthesiologist, the anesthetist and the                         technician. The procedure was verified in the                         procedure room.  After obtaining informed consent, the colonoscope was                         passed under direct vision. Throughout the procedure,                         the patient's blood pressure, pulse, and oxygen                         saturations were monitored  continuously. The                         Colonoscope was introduced through the anus and                         advanced to the the cecum, identified by appendiceal                         orifice and ileocecal valve. The colonoscopy was                         performed with ease. The patient tolerated the                         procedure well. The quality of the bowel preparation                         was good. Findings:      The perianal and digital rectal examinations were normal.      A 4 mm polyp was found in the transverse colon. The polyp was flat. The       polyp was removed with a jumbo cold forceps. Resection and retrieval       were complete.      The exam was otherwise without abnormality.      The rectum, sigmoid colon, descending colon, transverse colon, ascending       colon and cecum appeared normal.      The retroflexed view of the distal rectum and anal verge was normal and       showed no anal or rectal abnormalities. Impression:            - One 4 mm polyp in the transverse colon, removed with                         a jumbo cold forceps. Resected and retrieved.                        - The examination was otherwise normal.                        - The rectum, sigmoid colon, descending colon,                         transverse colon, ascending colon and cecum are normal.                        - The distal rectum and anal verge are normal on  retroflexion view. Recommendation:        - Discharge patient to home (with escort).                        - Advance diet as tolerated.                        - Continue present medications.                        - Await pathology results.                        - Repeat colonoscopy in 5 years if polyps show                         adenoma, 10 years if they are hyperplastic.                        - The findings and recommendations were discussed with                         the patient.                         - The findings and recommendations were discussed with                         the patient's family.                        - Return to primary care physician as previously                         scheduled. Procedure Code(s):     --- Professional ---                        [phone], Colonoscopy, flexible; with biopsy, single or                         multiple Diagnosis Code(s):     --- Professional ---                        Z12.11, Encounter for screening for malignant neoplasm                         of colon                        K63.5, Polyp of colon CPT copyright 2019 American Medical Association. All rights reserved. The codes documented in this report are preliminary and upon coder review may  be revised to meet current compliance requirements.  Vonda Antigua, MD Margretta Sidle B. Bonna Gains MD, MD 04/15/2020 9:40:58 AM This report has been signed electronically. Number of Addenda: 0 Note Initiated On: 04/15/2020 9:03 AM Scope Withdrawal Time: 0 hours 14 minutes 56 seconds  Total Procedure Duration: 0 hours 21 minutes 31 seconds  Estimated Blood Loss:  Estimated blood loss: none.      [facility]

## 2020-04-15 NOTE — H&P (Signed)
Vonda Antigua, MD [address], [place], [place], [place], [address] [address], [place], [place], [place], [address] Phone: [phone]  Fax: [phone]  Primary Care Physician:  Ria Bush, MD   Pre-Procedure History & Physical: HPI:  Dana Salas is a 49 y.o. female is here for a colonoscopy.   Past Medical History:  Diagnosis Date  . Boil, labium 33/2016  . Other abnormal blood chemistry    Iron,serum, elevated  . Pneumonia 11/03   outpatient    History reviewed. No pertinent surgical history.  Prior to Admission medications   Medication Sig Start Date End Date Taking? Authorizing Provider  cetirizine (ZYRTEC) 10 MG tablet Take 10 mg by mouth daily as needed for allergies.    [provider]  ibuprofen (ADVIL) 200 MG tablet Take 3 tablets (600 mg total) by mouth 2 (two) times daily as needed for headache. Patient not taking: Reported on 03/27/2020 03/18/20   Ria Bush, MD  Multiple Vitamins-Minerals (EMERGEN-C IMMUNE) PACK Take 1 Package by mouth daily. Patient not taking: Reported on 03/27/2020 03/18/20   Ria Bush, MD    Allergies as of 03/28/2020  . (No Known Allergies)    Family History  Problem Relation Age of Onset  . Hypertension Father   . COPD Father        smoker  . Heart attack Father   . High Cholesterol Father   . Stroke Father   . Thyroid disease Father   . Hypertension Sister   . High Cholesterol Sister   . AVM Sister        with bleed (at [facility])  . Cancer Maternal Grandmother        unsure type  . Breast cancer Paternal Grandmother   . Dementia Mother   . High Cholesterol Mother   . Hypertension Brother   . High Cholesterol Brother   . Early death Paternal Grandfather 22  . Heart attack Paternal Grandfather     Social History   Socioeconomic History  . Marital status: Married    Spouse name: Not on file  . Number of children: 3  . Years of education: Not on file  . Highest education  level: Not on file  Occupational History  . Occupation: Programme researcher, broadcasting/film/video at [employer]  . Smoking status: Never Smoker  . Smokeless tobacco: Never Used  Substance and Sexual Activity  . Alcohol use: Yes    Alcohol/week: 1.0 standard drinks    Types: 1 Glasses of wine per week    Comment: occasionally  . Drug use: No  . Sexual activity: Yes    Birth control/protection: None  Other Topics Concern  . Not on file  Social History Narrative  . Not on file   Social Determinants of Health   Financial Resource Strain:   . Difficulty of Paying Living Expenses:   Food Insecurity:   . Worried About Charity fundraiser in the Last Year:   . Arboriculturist in the Last Year:   Transportation Needs:   . Film/video editor (Medical):   Marland Kitchen Lack of Transportation (Non-Medical):   Physical Activity:   . Days of Exercise per Week:   . Minutes of Exercise per Session:   Stress:   . Feeling of Stress :   Social Connections:   . Frequency of Communication with Friends and Family:   . Frequency of Social Gatherings with Friends and Family:   . Attends Religious Services:   .  Active Member of Clubs or Organizations:   . Attends Archivist Meetings:   Marland Kitchen Marital Status:   Intimate Partner Violence:   . Fear of Current or Ex-Partner:   . Emotionally Abused:   Marland Kitchen Physically Abused:   . Sexually Abused:     Review of Systems: See HPI, otherwise negative ROS  Physical Exam: BP 123/81   Pulse 75   Temp (!) 97.3 F (36.3 C) (Temporal)   Resp 16   Ht 5\' 4"  (1.626 m)   Wt 62.1 kg   SpO2 100%   BMI 23.52 kg/m  General:   Alert,  pleasant and cooperative in NAD Head:  Normocephalic and atraumatic. Neck:  Supple; no masses or thyromegaly. Lungs:  Clear throughout to auscultation, normal respiratory effort.    Heart:  +S1, +S2, Regular rate and rhythm, No edema. Abdomen:  Soft, nontender and nondistended. Normal bowel sounds, without guarding, and without rebound.    Neurologic:  Alert and  oriented x4;  grossly normal neurologically.  Impression/Plan: Dana Salas is here for a colonoscopy to be performed for average risk screening.  Risks, benefits, limitations, and alternatives regarding  colonoscopy have been reviewed with the patient.  Questions have been answered.  All parties agreeable.   Virgel Manifold, MD  04/15/2020, 8:51 AM

## 2020-04-15 NOTE — Anesthesia Postprocedure Evaluation (Signed)
Anesthesia Post Note  Patient: Dana Salas  Procedure(s) Performed: COLONOSCOPY WITH PROPOFOL (N/A )  Patient location during evaluation: Endoscopy Anesthesia Type: General Level of consciousness: awake and alert and oriented Pain management: pain level controlled Vital Signs Assessment: post-procedure vital signs reviewed and stable Respiratory status: spontaneous breathing, nonlabored ventilation and respiratory function stable Cardiovascular status: blood pressure returned to baseline and stable Postop Assessment: no signs of nausea or vomiting Anesthetic complications: no     Last Vitals:  Vitals:   04/15/20 0942 04/15/20 1002  BP: (!) 83/57 108/75  Pulse: 72   Resp: 17   Temp: 36.7 C   SpO2: 100%     Last Pain:  Vitals:   04/15/20 1002  TempSrc:   PainSc: 0-No pain                 [name]

## 2020-04-15 NOTE — Anesthesia Preprocedure Evaluation (Signed)
Anesthesia Evaluation  Patient identified by MRN, date of birth, ID band Patient awake    Reviewed: Allergy & Precautions, NPO status , Patient's Chart, lab work & pertinent test results  History of Anesthesia Complications Negative for: history of anesthetic complications  Airway Mallampati: II  TM Distance: >3 FB Neck ROM: Full    Dental no notable dental hx.    Pulmonary neg pulmonary ROS, neg sleep apnea, neg COPD,    breath sounds clear to auscultation- rhonchi (-) wheezing      Cardiovascular Exercise Tolerance: Good (-) hypertension(-) CAD and (-) Past MI  Rhythm:Regular Rate:Normal - Systolic murmurs and - Diastolic murmurs    Neuro/Psych negative neurological ROS  negative psych ROS   GI/Hepatic negative GI ROS, Neg liver ROS,   Endo/Other  negative endocrine ROSneg diabetes  Renal/GU negative Renal ROS     Musculoskeletal negative musculoskeletal ROS (+)   Abdominal (+) - obese,   Peds  Hematology negative hematology ROS (+)   Anesthesia Other Findings Past Medical History: 33/2016: Boil, labium No date: Other abnormal blood chemistry     Comment:  Iron,serum, elevated 11/03: Pneumonia     Comment:  outpatient   Reproductive/Obstetrics                             Anesthesia Physical Anesthesia Plan  ASA: I  Anesthesia Plan: General   Post-op Pain Management:    Induction: Intravenous  PONV Risk Score and Plan: 2 and Propofol infusion  Airway Management Planned: Natural Airway  Additional Equipment:   Intra-op Plan:   Post-operative Plan:   Informed Consent: I have reviewed the patients History and Physical, chart, labs and discussed the procedure including the risks, benefits and alternatives for the proposed anesthesia with the patient or authorized representative who has indicated his/her understanding and acceptance.     Dental advisory given  Plan  Discussed with: CRNA and Anesthesiologist  Anesthesia Plan Comments:         Anesthesia Quick Evaluation

## 2020-04-16 ENCOUNTER — Encounter: Payer: Self-pay | Admitting: Gastroenterology

## 2020-04-16 ENCOUNTER — Encounter: Payer: Self-pay | Admitting: *Deleted

## 2020-04-16 LAB — SURGICAL PATHOLOGY

## 2020-04-18 ENCOUNTER — Encounter: Payer: Self-pay | Admitting: Family Medicine

## 2020-06-27 ENCOUNTER — Emergency Department: Payer: BC Managed Care – PPO

## 2020-06-27 ENCOUNTER — Encounter: Payer: Self-pay | Admitting: Emergency Medicine

## 2020-06-27 DIAGNOSIS — R0789 Other chest pain: Secondary | ICD-10-CM | POA: Insufficient documentation

## 2020-06-27 DIAGNOSIS — R079 Chest pain, unspecified: Secondary | ICD-10-CM | POA: Diagnosis not present

## 2020-06-27 LAB — TROPONIN I (HIGH SENSITIVITY): Troponin I (High Sensitivity): <2 ng/L (ref <18)

## 2020-06-27 LAB — BASIC METABOLIC PANEL
Anion gap: 9 (ref 5–15)
BUN: 17 mg/dL (ref 6–20)
CO2: 25 mmol/L (ref 22–32)
Calcium: 8.8 mg/dL — ABNORMAL LOW (ref 8.9–10.3)
Chloride: 104 mmol/L (ref 98–111)
Creatinine, Ser: 0.78 mg/dL (ref 0.44–1.00)
GFR calc Af Amer: >60 mL/min (ref >60)
GFR calc non Af Amer: >60 mL/min (ref >60)
Glucose, Bld: 109 mg/dL — ABNORMAL HIGH (ref 70–99)
Potassium: 3.6 mmol/L (ref 3.5–5.1)
Sodium: 138 mmol/L (ref 135–145)

## 2020-06-27 LAB — CBC
HCT: 37.7 % (ref 36.0–46.0)
Hemoglobin: 13.1 g/dL (ref 12.0–15.0)
MCH: 32.5 pg (ref 26.0–34.0)
MCHC: 34.7 g/dL (ref 30.0–36.0)
MCV: 93.5 fL (ref 80.0–100.0)
Platelets: 219 10*3/uL (ref 150–400)
RBC: 4.03 MIL/uL (ref 3.87–5.11)
RDW: 11.9 % (ref 11.5–15.5)
WBC: 6.6 10*3/uL (ref 4.0–10.5)
nRBC: 0 % (ref 0.0–0.2)

## 2020-06-27 NOTE — ED Triage Notes (Signed)
Pt c/o left sided intermittent chest pain x1 month. Pt denies SOB, N/V. Pt referred by PCP to come to ED due to continued pain.

## 2020-06-28 ENCOUNTER — Telehealth: Payer: Self-pay

## 2020-06-28 ENCOUNTER — Emergency Department
Admission: EM | Admit: 2020-06-28 | Discharge: 2020-06-28 | Disposition: A | Discharge status: Home / Self Care | Payer: BC Managed Care – PPO | Attending: Emergency Medicine | Admitting: Emergency Medicine

## 2020-06-28 DIAGNOSIS — R079 Chest pain, unspecified: Secondary | ICD-10-CM

## 2020-06-28 LAB — FIBRIN DERIVATIVES D-DIMER (ARMC ONLY): Fibrin derivatives D-dimer (ARMC): 313.21 ng/mL (FEU) (ref 0.00–499.00)

## 2020-06-28 LAB — TROPONIN I (HIGH SENSITIVITY): Troponin I (High Sensitivity): <2 ng/L (ref <18)

## 2020-06-28 NOTE — Telephone Encounter (Signed)
[facility] - Client TELEPHONE ADVICE RECORD [facility] Patient Name: TANIAH REINECKE Gender: Female DOB: May 20, 1971 Age: 49 Y 14 D Return Phone Num________L_e_B_a_u_e_r_ _P_r_i_m_a_r_y_ _C_a_r_e_ _S_t_o_n_e_y_ _C_r_e_e_k_ _D_a_y_ _-_ _C_l_i_e_n_t___T_E_L_E_P_H_O_N_E_ _A_D_V_I_C_E_ _R_E_C_O_R_D___A_c_c_e_s_s_N_u_r_s_e____P_a_t_i_e_n_t_ _N_a_m_e_:_ _T_O_N_J_A_ _W_A_L_K_E_R___G_e_n_d_e_r_:_ _F_e_m_a_l_e___D_O_B_:_ _7_/_2_2_/_1_9_7_4___A_g_e_:_ _4_7_ _Y_ _1_4_ _D___R_e_t_u_r_n_ _P_h_o_n_e___N_u_m_b_e_r_:_ _3_3_6_2_6_0_2_9_5_5_ _(_P_r_i_m_a_r_y_)___A_d_d_r_e_s_s_:___C_i_t_y_/_S_t_a_t_e_/_Z_i_p_:_ _B_u_r_l_i_n_g_t_o_n_ _N_C_ _2_7_2_1_7___C_l_i_e_n_t_ _L_e_B_a_u_e_r_ _P_r_i_m_a_r_y_ _C_a_r_e_ _S_t_o_n_e_y_ _C_r_e_e_k_ _D_a_y_ _-_ _C_l_i_e_n_t___C_l_i_e_n_t_ _S_i_t_e_ _L_e_B_a_u_e_r_ _P_r_i_m_a_r_y_ _C_a_r_e_ _S_t_o_n_e_y_ _C_r_e_e_k_ _-_ _D_a_y___P_h_y_s_i_c_i_a_n_ _G_u_t_i_e_r_r_e_z_,_ _J_a_v_i_e_r_ _-_ _M_D___C_o_n_t_a_c_t_ _T_y_p_e_ _C_a_l_l___W_h_o_ _I_s_ _C_a_l_l_i_n_g_ _P_a_t_i_e_n_t_ _/_ _M_e_m_b_e_r_ _/_ _F_a_m_i_l_y_ _/_ _C_a_r_e_g_i_v_e_r___C_a_l_l_ _T_y_p_e_ _T_r_i_a_g_e_ _/_ _C_l_i_n_i_c_a_l___R_e_l_a_t_i_o_n_s_h_i_p_ _T_o_ _P_a_t_i_e_n_t_ _S_e_l_f___R_e_t_u_r_n_ _P_h_o_n_e_ _N_u_m_b_e_r_ _P_l_e_a_s_e_ _c_h_o_o_s_e_ _p_h_o_n_e_ _n_u_m_b_e_r___C_h_i_e_f_ _C_o_m_p_l_a_i_n_t_ _C_H_E_S_T_ _P_A_I_N_ _(_>_=_2_1_ _y_e_a_r_s_)_ _-_ _p_a_i_n_,_ _p_r_e_s_s_u_r_e_,___h_e_a_v_i_n_e_s_s_ _o_r_ _t_i_g_h_t_n_e_s_s___R_e_a_s_o_n_ _f_o_r_ _C_a_l_l_ _S_y_m_p_t_o_m_a_t_i_c_ _/_ _R_e_q_u_e_s_t_ _f_o_r_ _H_e_a_l_t_h_ _I_n_f_o_r_m_a_t_i_o_n___I_n_i_t_i_a_l_ _C_o_m_m_e_n_t_ _C_a_l_l_e_r_ _s_t_a_t_e_s_ _s_h_e_ _i_s_ _f_e_e_l_i_n_g_ _p_r_e_s_s_u_r_e_ _i_n_ _h_e_r_ _c_h_e_s_t_.___S_h_e_ _h_a_s_ _b_e_e_n_ _e_a_t_i_n_g_ _m_o_r_e_ _w_h_o_l_e_ _f_o_o_d_s_._ _S_h_e_ _i_s___p_r_e_t_t_y_ _g_a_s_s_y_._ _S_h_e_ _w_o_u_l_d_ _l_i_k_e_ _t_o_ _s_c_h_e_d_u_l_e_ _a_n_ _a_p_p_t_.___G_O_T_O_ _F_a_c_i_l_i_t_y_ _N_o_t_  _L_i_s_t_e_d_ _C_o_n_e_ _H_e_a_l_t_h_ _A_l_a_m_a_n_c_e_ _R_e_g_i_o_n_a_l_ _M_e_d_i_c_a_l_ _C_e_n_t_e_r_,___1_2_4_0_ _H_u_f_f_m_a_n_ _M_i_l_l_ _R_d_,_ _B_u_r_l_i_n_g_t_o_n_,_ _N_C_ _2_7_2_1_5___T_r_a_n_s_l_a_t_i_o_n_ _N_o___N_u_r_s_e_ _A_s_s_e_s_s_m_e_n_t___N_u_r_s_e_:_ _R_o_b_e_r_t_s_,_ _R_N_,_ _D_o_n_n_a_ _D_a_t_e_/_T_i_m_e_ _(_E_a_s_t_e_r_n_ _T_i_m_e_)_:_ _8_/_5_/_2_0_2_1_ _5_:_0_2_:_0_3_ _P_M___C_o_n_f_i_r_m_ _a_n_d_ _d_o_c_u_m_e_n_t_ _r_e_a_s_o_n_ _f_o_r_ _c_a_l_l_._ _I_f_ _s_y_m_p_t_o_m_a_t_i_c_,___d_e_s_c_r_i_b_e_ _s_y_m_p_t_o_m_s_.___-_-_-_C_a_l_l_e_r_ _s_t_a_t_e_s_ _s_h_e_ _i_s_ _h_a_v_i_n_g_ _i_n_c_r_e_a_s_i_n_g_ _p_r_e_s_s_u_r_e_ _i_n_ _h_e_r_ _c_h_e_s_t___c_a_u_s_e_d_ _b_y_ _g_a_s_ _p_a_i_n_s_._ _S_h_e_ _s_t_a_t_e_s_ _s_h_e_ _s_t_a_r_t_e_d_ _a_ _n_e_w_ _d_i_e_t_ _e_a_t_i_n_g___m_o_r_e_ _w_h_o_l_e_ _f_o_o_d_s_ _a_n_d_ _a_n_d_ _a_l_s_o_ _s_h_e_ _i_s_ _e_x_e_r_c_i_s_i_n_g_ _m_o_r_e_,_ _b_u_t_ _s_h_e_ _i_s___c_o_n_c_e_r_n_e_d_ _a_b_o_u_t_ _t_h_e_ _c_h_e_s_t_ _p_a_i_n_.___H_a_s_ _t_h_e_ _p_a_t_i_e_n_t_ _h_a_d_ _c_l_o_s_e_ _c_o_n_t_a_c_t_ _w_i_t_h_ _a_ _p_e_r_s_o_n_ _k_n_o_w_n_ _o_r___s_u_s_p_e_c_t_e_d_ _t_o_ _h_a_v_e_ _t_h_e_ _n_o_v_e_l_ _c_o_r_o_n_a_v_i_r_u_s_ _i_l_l_n_e_s_s_ _O_R_ _t_r_a_v_e_l_e_d_ _/___l_i_v_e_s_ _i_n_ _a_r_e_a_ _w_i_t_h_ _m_a_j_o_r_ _c_o_m_m_u_n_i_t_y_ _s_p_r_e_a_d_ _(_i_n_c_l_u_d_i_n_g___i_n_t_e_r_n_a_t_i_o_n_a_l_ _t_r_a_v_e_l_)_ _i_n_ _t_h_e_ _l_a_s_t_ _1_4_ _d_a_y_s_ _f_r_o_m_ _t_h_e_ _o_n_s_e_t_ _o_f___s_y_m_p_t_o_m_s_?_ _*_ _I_f_ _A_s_y_m_p_t_o_m_a_t_i_c_,_ _s_c_r_e_e_n_ _f_o_r_ _e_x_p_o_s_u_r_e_ _a_n_d_ _t_r_a_v_e_l___w_i_t_h_i_n_ _t_h_e_ _l_a_s_t_ _1_4_ _d_a_y_s_.___-_-_-_N_o___D_o_e_s_ _t_h_e_ _p_a_t_i_e_n_t_ _h_a_v_e_ _a_n_y_ _n_e_w_ _o_r_ _w_o_r_s_e_n_i_n_g_ _s_y_m_p_t_o_m_s_?_ _-_-_-_Y_e_s___W_i_l_l_ _a_ _t_r_i_a_g_e_ _b_e_ _c_o_m_p_l_e_t_e_d_?_ _-_-_-_Y_e_s___R_e_l_a_t_e_d_ _v_i_s_i_t_ _t_o_ _p_h_y_s_i_c_i_a_n_ _w_i_t_h_i_n_ _t_h_e_ _l_a_s_t_ _2_ _w_e_e_k_s_?_  _-_-_-_N_o___D_o_e_s_ _t_h_e_ _P_T_ _h_a_v_e_ _a_n_y_ _c_h_r_o_n_i_c_ _c_o_n_d_i_t_i_o_n_s_?_ _(_i_._e_._ _d_i_a_b_e_t_e_s_,___a_s_t_h_m_a_,_ _t_h_i_s_ _i_n_c_l_u_d_e_s_ _H_i_g_h_ _r_i_s_k_ _f_a_c_t_o_r_s_ _f_o_r_ _p_r_e_g_n_a_n_c_y_,_ _e_t_c_._)___-_-_-_Y_e_s___L_i_s_t_ _c_h_r_o_n_i_c_ _c_o_n_d_i_t_i_o_n_s_._ _-_-_-_F_i_b_r_o_c_y_s_t_i_c_ _b_r_e_a_s_t_ _d_i_s_e_a_s_e_,_ _C_o_l_o_n_ _p_o_l_y_p_-_ _p_r_e_c_a_n_c_e_r_o_u_s___I_s_ _t_h_e_ _p_a_t_i_e_n_t_ _p_r_e_g_n_a_n_t_ _o_r_ _p_o_s_s_i_b_l_y_ _p_r_e_g_n_a_n_t_?_ _(_A_s_k_ _a_l_l_ _f_e_m_a_l_e_s___b_e_t_w_e_e_n_ _t_h_e_ _a_g_e_s_ _o_f_ _1_2_-_5_5_)___-_-_-_N_o___I_s_ _t_h_i_s_ _a_ _b_e_h_a_v_i_o_r_a_l_ _h_e_a_l_t_h_ _o_r_ _s_u_b_s_t_a_n_c_e_ _a_b_u_s_e_ _c_a_l_l_?_

## 2020-06-28 NOTE — Telephone Encounter (Signed)
Per chart review tab pt went to [facility] ED today.  The note from access nurse came up typed differently and I could not change the print; I am sending the faxed access note for scanning and I am putting a copy for you in your in box.

## 2020-06-28 NOTE — ED Notes (Signed)
Pt alert and oriented X 4, stable for discharge. RR even and unlabored, color WNL. Discussed discharge instructions and follow up when appropriate. Instructed to follow up with ER for any life threatening symptoms or concerns that patient or family of patient may have  

## 2020-06-28 NOTE — ED Provider Notes (Signed)
[facility] Emergency Department Provider Note  ____________________________________________   First MD Initiated Contact with Patient 06/28/20 [phone]     (approximate)  I have reviewed the triage vital signs and the nursing notes.   HISTORY  Chief Complaint Chest Pain    HPI Dana Salas is a 49 y.o. female with below list of previous medical conditions presents to the emergency department secondary to intermittent chest tightness which patient states last "seconds" over the past month.  Patient does admit to dyspnea when the pain occurs with deep breathing.  Patient denies any current pain no new shortness of breath no nausea vomiting        Past Medical History:  Diagnosis Date  . Boil, labium 33/2016  . Other abnormal blood chemistry    Iron,serum, elevated  . Pneumonia 11/03   outpatient    Patient Active Problem List   Diagnosis Date Noted  . Encounter for screening colonoscopy   . Polyp of colon   . Health maintenance examination 03/18/2020  . Chronic hand pain, right 03/18/2020  . IRON, SERUM, ELEVATED 04/30/2009    Past Surgical History:  Procedure Laterality Date  . COLONOSCOPY WITH PROPOFOL N/A 04/15/2020   TA (Tahiliani, Lennette Bihari, MD)    Prior to Admission medications   Medication Sig Start Date End Date Taking? Authorizing Provider  cetirizine (ZYRTEC) 10 MG tablet Take 10 mg by mouth daily as needed for allergies.    [provider]  ibuprofen (ADVIL) 200 MG tablet Take 3 tablets (600 mg total) by mouth 2 (two) times daily as needed for headache. Patient not taking: Reported on 03/27/2020 03/18/20   Ria Bush, MD  Multiple Vitamins-Minerals (EMERGEN-C IMMUNE) PACK Take 1 Package by mouth daily. Patient not taking: Reported on 03/27/2020 03/18/20   Ria Bush, MD    Allergies Patient has no known allergies.  Family History  Problem Relation Age of Onset  . Hypertension Father   . COPD Father         smoker  . Heart attack Father   . High Cholesterol Father   . Stroke Father   . Thyroid disease Father   . Hypertension Sister   . High Cholesterol Sister   . AVM Sister        with bleed (at [facility])  . Cancer Maternal Grandmother        unsure type  . Breast cancer Paternal Grandmother   . Dementia Mother   . High Cholesterol Mother   . Hypertension Brother   . High Cholesterol Brother   . Early death Paternal Grandfather 77  . Heart attack Paternal Grandfather     Social History Social History   Tobacco Use  . Smoking status: Never Smoker  . Smokeless tobacco: Never Used  Vaping Use  . Vaping Use: Never used  Substance Use Topics  . Alcohol use: Yes    Alcohol/week: 1.0 standard drink    Types: 1 Glasses of wine per week    Comment: occasionally  . Drug use: No    Review of Systems Constitutional: No fever/chills Eyes: No visual changes. ENT: No sore throat. Cardiovascular: Positive for chest pain. Respiratory: Denies shortness of breath. Gastrointestinal: No abdominal pain.  No nausea, no vomiting.  No diarrhea.  No constipation. Genitourinary: Negative for dysuria. Musculoskeletal: Negative for neck pain.  Negative for back pain. Integumentary: Negative for rash. Neurological: Negative for headaches, focal weakness or numbness.   ____________________________________________   PHYSICAL EXAM:  VITAL SIGNS: ED Triage Vitals [06/27/20 1914]  Enc Vitals Group     BP 118/76     Pulse Rate 83     Resp 17     Temp 98.1 F (36.7 C)     Temp Source Oral     SpO2 100 %     Weight      Height      Head Circumference      Peak Flow      Pain Score      Pain Loc      Pain Edu?      Excl. in [place]?     Constitutional: Alert and oriented.  Eyes: Conjunctivae are normal.  Head: Atraumatic. Mouth/Throat: Patient is wearing a mask. Neck: No stridor.  No meningeal signs.   Cardiovascular: Normal rate, regular rhythm. Good peripheral circulation.  Grossly normal heart sounds. Respiratory: Normal respiratory effort.  No retractions. Gastrointestinal: Soft and nontender. No distention.  Musculoskeletal: No lower extremity tenderness nor edema. No gross deformities of extremities. Neurologic:  Normal speech and language. No gross focal neurologic deficits are appreciated.  Skin:  Skin is warm, dry and intact. Psychiatric: Mood and affect are normal. Speech and behavior are normal.  ____________________________________________   LABS (all labs ordered are listed, but only abnormal results are displayed)  Labs Reviewed  BASIC METABOLIC PANEL - Abnormal; Notable for the following components:      Result Value   Glucose, Bld 109 (*)    Calcium 8.8 (*)    All other components within normal limits  CBC  POC URINE PREG, ED  TROPONIN I (HIGH SENSITIVITY)  TROPONIN I (HIGH SENSITIVITY)   ____________________________________________  EKG  ED ECG REPORT I, [place] Junella Domke, the attending physician, personally viewed and interpreted this ECG.   Date: 06/27/2020  EKG Time: 7:08 PM  Rate: 88  Rhythm: Normal sinus rhythm  Axis: Normal  Intervals: Normal  ST&T Change: None  ____________________________________________  RADIOLOGY I, [place] Zyree Traynham, personally viewed and evaluated these images (plain radiographs) as part of my medical decision making, as well as reviewing the written report by the radiologist.  ED MD interpretation: No active cardiopulmonary disease noted on chest x-ray per radiologist.  Official radiology report(s): DG Chest 2 View  Result Date: 06/27/2020 CLINICAL DATA:  Chest pain. EXAM: CHEST - 2 VIEW COMPARISON:  None. FINDINGS: The heart size and mediastinal contours are within normal limits. Both lungs are clear. The visualized skeletal structures are unremarkable. IMPRESSION: No active cardiopulmonary disease. Electronically Signed   By: Constance Holster M.D.   On: 06/27/2020 19:51     _______  Procedures   ____________________________________________   INITIAL IMPRESSION / MDM / [place] / ED COURSE  As part of my medical decision making, I reviewed the following data within the electronic MEDICAL RECORD NUMBER   49 year old female presented with above-stated history and physical exam differential diagnosis including but not limited to ACS, angina, pulmonary emboli versus musculoskeletal etiology.  EKG revealed no evidence of ischemia or infarction laboratory data including high-sensitivity troponin x2 -.  D-dimer pending at this time and if negative anticipate the patient will be able to be discharged home with outpatient follow-up with Dr. Rockey Situ ____________________________________________  FINAL CLINICAL IMPRESSION(S) / ED DIAGNOSES  Final diagnoses:  Chest pain, unspecified type     MEDICATIONS GIVEN DURING THIS VISIT:  Medications - No data to display   ED Discharge Orders    None      *  Please note:  Dana Salas was evaluated in Emergency Department on 06/28/2020 for the symptoms described in the history of present illness. She was evaluated in the context of the global COVID-19 pandemic, which necessitated consideration that the patient might be at risk for infection with the SARS-CoV-2 virus that causes COVID-19. Institutional protocols and algorithms that pertain to the evaluation of patients at risk for COVID-19 are in a state of rapid change based on information released by regulatory bodies including the CDC and federal and state organizations. These policies and algorithms were followed during the patient's care in the ED.  Some ED evaluations and interventions may be delayed as a result of limited staffing during and after the pandemic.*  Note:  This document was prepared using Dragon voice recognition software and may include unintentional dictation errors.   Gregor Hams, MD 07/02/20 [phone]

## 2020-06-28 NOTE — ED Provider Notes (Signed)
Patient received in signout from Dr. Owens Shark patient work-up pending D-dimer.  D-dimer resulted as normal.  Patient appropriate for outpatient follow-up.   Merlyn Lot, MD 06/28/20 [phone]

## 2020-06-28 NOTE — Telephone Encounter (Signed)
Phone note reviewed. Seen at ER, reassuring eval for chest pain. Note pending.

## 2020-07-05 NOTE — Telephone Encounter (Signed)
Noted. Reassuring ER workup. Appt scheduled with cardiology for next month.

## 2020-07-23 ENCOUNTER — Encounter: Payer: Self-pay | Admitting: Family Medicine

## 2020-07-23 NOTE — Telephone Encounter (Signed)
Spoke with pt to schedule OV.  Pt declined stating she will do and e-visit or UC.  FYI to Dr. Darnell Level.

## 2020-08-09 DIAGNOSIS — H60331 Swimmer's ear, right ear: Secondary | ICD-10-CM | POA: Diagnosis not present

## 2020-08-20 ENCOUNTER — Ambulatory Visit: Payer: BC Managed Care – PPO | Admitting: Cardiology

## 2020-08-27 DIAGNOSIS — H60331 Swimmer's ear, right ear: Secondary | ICD-10-CM | POA: Diagnosis not present

## 2020-09-10 ENCOUNTER — Encounter: Payer: BC Managed Care – PPO | Admitting: Obstetrics and Gynecology

## 2020-09-20 ENCOUNTER — Encounter: Payer: Self-pay | Admitting: Cardiovascular Disease

## 2020-09-20 ENCOUNTER — Other Ambulatory Visit: Payer: Self-pay

## 2020-09-20 ENCOUNTER — Ambulatory Visit
Admission: RE | Admit: 2020-09-20 | Discharge: 2020-09-20 | Disposition: A | Discharge status: Home / Self Care | Payer: BC Managed Care – PPO | Source: Ambulatory Visit | Attending: Cardiovascular Disease | Admitting: Cardiovascular Disease

## 2020-09-20 ENCOUNTER — Ambulatory Visit (INDEPENDENT_AMBULATORY_CARE_PROVIDER_SITE_OTHER): Payer: BC Managed Care – PPO | Admitting: Cardiovascular Disease

## 2020-09-20 VITALS — BP 100/72 | HR 82 | Ht 64.0 in | Wt 144.2 lb

## 2020-09-20 DIAGNOSIS — R0789 Other chest pain: Secondary | ICD-10-CM | POA: Insufficient documentation

## 2020-09-20 DIAGNOSIS — Z8249 Family history of ischemic heart disease and other diseases of the circulatory system: Secondary | ICD-10-CM | POA: Insufficient documentation

## 2020-09-20 NOTE — Patient Instructions (Addendum)
Medication Instructions:  No changes  If you need a refill on your cardiac medications before your next appointment, please call your pharmacy.    Lab work: No new labs needed   If you have labs (blood work) drawn today and your tests are completely normal, you will receive your results only by: Marland Kitchen MyChart Message (if you have MyChart) OR . A paper copy in the mail If you have any lab test that is abnormal or we need to change your treatment, we will call you to review the results.   Testing/Procedures: We will order a CT coronary calcium score  We will order CT coronary calcium score $154.42 at our [facility] in [place]   Please call [phone] to schedule [facility] or [facility] [place], [place] [address]  Follow-Up: At [facility], you and your health needs are our priority.  As part of our continuing mission to provide you with exceptional heart care, we have created designated Provider Care Teams.  These Care Teams include your primary Cardiologist (physician) and Advanced Practice Providers (APPs -  Physician Assistants and Nurse Practitioners) who all work together to provide you with the care you need, when you need it.  You will need a follow up appointment as needed  . Providers on your designated Care Team:   . Murray Hodgkins, NP . Christell Faith, PA-C . Marrianne Mood, PA-C  Any Other Special Instructions Will Be Listed Below (If Applicable).  COVID-19 Vaccine Information can be found at: [email or web address] For questions related to vaccine distribution or appointments, please email [email or web address] or call [phone].

## 2020-09-20 NOTE — Progress Notes (Signed)
Cardiology Office Note  Date:  09/20/2020   ID:  Dana Salas, Dana Salas 04/09/1971, MRN [id number]  PCP:  Ria Bush, MD   Chief Complaint  Patient presents with  . New Patient (Initial Visit)    Ref by Dr. Danise Mina for chest pain. Meds reviewed by the pt. verbally. Pt. c/o chest fluttering with occas. chest discomfort at times. Pt. has a strong family Hx. of premature CAD with her paternal grandfather with an MI at age 47 and her dad with an MI early 47's.     HPI:  Dana Salas is a 49 year old woman with with no significant prior past cardiac history  who presents by referral from Dr. Encarnacion Slates for consultation of her chest pain  Seen in the emergency room June 28, 2020 for chest pain Reports it lasts seconds at a time Some pain with deep inspiration Cardiac enzymes negative, D-dimer negative was discharged home Feels some of the discomfort was from gas, She had started a new diet with high-protein which may have contributed  Prior smoking history, Diabetes Cholesterol is actually very well controlled was 162,019  Denies chest pain on exertion  Strong family hx, Mother and father with high cholesterol Parents were smokers, Coronary disease is prevalent in many of her relatives and close family  EKG personally reviewed by myself on todays visit Shows normal sinus rhythm with rate 60 bpm no significant ST-T wave changes  PMH:   has a past medical history of Boil, labium (33/2016), Other abnormal blood chemistry, and Pneumonia (11/03).  PSH:    Past Surgical History:  Procedure Laterality Date  . COLONOSCOPY WITH PROPOFOL N/A 04/15/2020   TA (Tahiliani, Lennette Bihari, MD)    Current Outpatient Medications  Medication Sig Dispense Refill  . cetirizine (ZYRTEC) 10 MG tablet Take 10 mg by mouth daily as needed for allergies.    . fluticasone (FLONASE) 50 MCG/ACT nasal spray Place 1 spray into both nostrils daily.     Marland Kitchen ibuprofen (ADVIL) 200 MG tablet Take 3 tablets  (600 mg total) by mouth 2 (two) times daily as needed for headache.    . Multiple Vitamins-Minerals (EMERGEN-C IMMUNE) PACK Take 1 Package by mouth daily.     No current facility-administered medications for this visit.     Allergies:   Patient has no known allergies.   Social History:  The patient  reports that she has never smoked. She has never used smokeless tobacco. She reports current alcohol use of about 1.0 standard drink of alcohol per week. She reports that she does not use drugs.   Family History:   family history includes AVM in her sister; Breast cancer in her paternal grandmother; COPD in her father; Cancer in her maternal grandmother; Dementia in her mother; Early death (age of onset: 30) in her paternal grandfather; Heart attack in her father and paternal grandfather; High Cholesterol in her brother, father, mother, and sister; Hypertension in her brother, father, and sister; Stroke in her father; Thyroid disease in her father.    Review of Systems: Review of Systems  Constitutional: Negative.   HENT: Negative.   Respiratory: Negative.   Cardiovascular: Positive for chest pain.  Gastrointestinal: Negative.   Musculoskeletal: Negative.   Neurological: Negative.   Psychiatric/Behavioral: Negative.   All other systems reviewed and are negative.   PHYSICAL EXAM: VS:  BP 100/72 (BP Location: Right Arm, Patient Position: Sitting, Cuff Size: Normal)   Pulse 82   Ht 5\' 4"  (1.626 m)   Wt 144  lb 4 oz (65.4 kg)   SpO2 98%   BMI 24.76 kg/m  , BMI Body mass index is 24.76 kg/m. GEN: Well nourished, well developed, in no acute distress HEENT: normal Neck: no JVD, carotid bruits, or masses Cardiac: RRR; no murmurs, rubs, or gallops,no edema  Respiratory:  clear to auscultation bilaterally, normal work of breathing GI: soft, nontender, nondistended, + BS MS: no deformity or atrophy Skin: warm and dry, no rash Neuro:  Strength and sensation are intact Psych: euthymic mood,  full affect   Recent Labs: 06/27/2020: BUN 17; Creatinine, Ser 0.78; Hemoglobin 13.1; Platelets 219; Potassium 3.6; Sodium 138    Lipid Panel Lab Results  Component Value Date   CHOL 163 09/29/2018   HDL 67 09/29/2018   LDLCALC 83 09/29/2018   TRIG 66 09/29/2018      Wt Readings from Last 3 Encounters:  09/20/20 144 lb 4 oz (65.4 kg)  04/15/20 137 lb (62.1 kg)  03/18/20 137 lb (62.1 kg)      ASSESSMENT AND PLAN:  Problem List Items Addressed This Visit    None    Visit Diagnoses    Atypical chest pain    -  Primary     Chest pain No recurrent symptoms, she feels it was probably secondary to change in diet, high protein, gas Presents more for screening given strong family history of coronary disease Discussed various types of tests including stress testing, and other screening studies such as CT coronary calcium scoring -For screening study we recommended she start with CT coronary calcium scoring No major red flags given no smoking, no diabetes, cholesterol is low EKG normal, blood pressure low Continue walking program    Total encounter time more than 45 minutes  Greater than 50% was spent in counseling and coordination of care with the patient    Signed, Esmond Plants, M.D., Ph.D. [place], [place]

## 2020-09-26 ENCOUNTER — Encounter: Payer: BC Managed Care – PPO | Admitting: Obstetrics and Gynecology

## 2020-10-04 ENCOUNTER — Other Ambulatory Visit: Payer: Self-pay | Admitting: Obstetrics and Gynecology

## 2020-10-04 DIAGNOSIS — Z1231 Encounter for screening mammogram for malignant neoplasm of breast: Secondary | ICD-10-CM

## 2020-10-10 NOTE — Progress Notes (Signed)
Pt present for annual exam. Pt having irregular cycles. No other issues.

## 2020-10-10 NOTE — Patient Instructions (Signed)
Preventive Care 40-47 Years Old, Female Preventive care refers to visits with your health care provider and lifestyle choices that can promote health and wellness. This includes:  A yearly physical exam. This may also be called an annual well check.  Regular dental visits and eye exams.  Immunizations.  Screening for certain conditions.  Healthy lifestyle choices, such as eating a healthy diet, getting regular exercise, not using drugs or products that contain nicotine and tobacco, and limiting alcohol use. What can I expect for my preventive care visit? Physical exam Your health care provider will check your:  Height and weight. This may be used to calculate body mass index (BMI), which tells if you are at a healthy weight.  Heart rate and blood pressure.  Skin for abnormal spots. Counseling Your health care provider may ask you questions about your:  Alcohol, tobacco, and drug use.  Emotional well-being.  Home and relationship well-being.  Sexual activity.  Eating habits.  Work and work environment.  Method of birth control.  Menstrual cycle.  Pregnancy history. What immunizations do I need?  Influenza (flu) vaccine  This is recommended every year. Tetanus, diphtheria, and pertussis (Tdap) vaccine  You may need a Td booster every 10 years. Varicella (chickenpox) vaccine  You may need this if you have not been vaccinated. Zoster (shingles) vaccine  You may need this after age 60. Measles, mumps, and rubella (MMR) vaccine  You may need at least one dose of MMR if you were born in 1957 or later. You may also need a second dose. Pneumococcal conjugate (PCV13) vaccine  You may need this if you have certain conditions and were not previously vaccinated. Pneumococcal polysaccharide (PPSV23) vaccine  You may need one or two doses if you smoke cigarettes or if you have certain conditions. Meningococcal conjugate (MenACWY) vaccine  You may need this if you  have certain conditions. Hepatitis A vaccine  You may need this if you have certain conditions or if you travel or work in places where you may be exposed to hepatitis A. Hepatitis B vaccine  You may need this if you have certain conditions or if you travel or work in places where you may be exposed to hepatitis B. Haemophilus influenzae type b (Hib) vaccine  You may need this if you have certain conditions. Human papillomavirus (HPV) vaccine  If recommended by your health care provider, you may need three doses over 6 months. You may receive vaccines as individual doses or as more than one vaccine together in one shot (combination vaccines). Talk with your health care provider about the risks and benefits of combination vaccines. What tests do I need? Blood tests  Lipid and cholesterol levels. These may be checked every 5 years, or more frequently if you are over 50 years old.  Hepatitis C test.  Hepatitis B test. Screening  Lung cancer screening. You may have this screening every year starting at age 55 if you have a 30-pack-year history of smoking and currently smoke or have quit within the past 15 years.  Colorectal cancer screening. All adults should have this screening starting at age 50 and continuing until age 75. Your health care provider may recommend screening at age 45 if you are at increased risk. You will have tests every 1-10 years, depending on your results and the type of screening test.  Diabetes screening. This is done by checking your blood sugar (glucose) after you have not eaten for a while (fasting). You may have this   done every 1-3 years.  Mammogram. This may be done every 1-2 years. Talk with your health care provider about when you should start having regular mammograms. This may depend on whether you have a family history of breast cancer.  BRCA-related cancer screening. This may be done if you have a family history of breast, ovarian, tubal, or peritoneal  cancers.  Pelvic exam and Pap test. This may be done every 3 years starting at age 60. Starting at age 7, this may be done every 5 years if you have a Pap test in combination with an HPV test. Other tests  Sexually transmitted disease (STD) testing.  Bone density scan. This is done to screen for osteoporosis. You may have this scan if you are at high risk for osteoporosis. Follow these instructions at home: Eating and drinking  Eat a diet that includes fresh fruits and vegetables, whole grains, lean protein, and low-fat dairy.  Take vitamin and mineral supplements as recommended by your health care provider.  Do not drink alcohol if: ? Your health care provider tells you not to drink. ? You are pregnant, may be pregnant, or are planning to become pregnant.  If you drink alcohol: ? Limit how much you have to 0-1 drink a day. ? Be aware of how much alcohol is in your drink. In the [place] one drink equals one 12 oz bottle of beer (355 mL), one 5 oz glass of wine (148 mL), or one 1 oz glass of hard liquor (44 mL). Lifestyle  Take daily care of your teeth and gums.  Stay active. Exercise for at least 30 minutes on 5 or more days each week.  Do not use any products that contain nicotine or tobacco, such as cigarettes, e-cigarettes, and chewing tobacco. If you need help quitting, ask your health care provider.  If you are sexually active, practice safe sex. Use a condom or other form of birth control (contraception) in order to prevent pregnancy and STIs (sexually transmitted infections).  If told by your health care provider, take low-dose aspirin daily starting at age 48. What's next?  Visit your health care provider once a year for a well check visit.  Ask your health care provider how often you should have your eyes and teeth checked.  Stay up to date on all vaccines. This information is not intended to replace advice given to you by your health care provider. Make sure you  discuss any questions you have with your health care provider. Document Revised: 07/21/2018 Document Reviewed: 07/21/2018 Elsevier Patient Education  2020 [place] Breast self-awareness is knowing how your breasts look and feel. Doing breast self-awareness is important. It allows you to catch a breast problem early while it is still small and can be treated. All women should do breast self-awareness, including women who have had breast implants. Tell your doctor if you notice a change in your breasts. What you need:  A mirror.  A well-lit room. How to do a breast self-exam A breast self-exam is one way to learn what is normal for your breasts and to check for changes. To do a breast self-exam: Look for changes  1. Take off all the clothes above your waist. 2. Stand in front of a mirror in a room with good lighting. 3. Put your hands on your hips. 4. Push your hands down. 5. Look at your breasts and nipples in the mirror to see if one breast or nipple looks different from the  other. Check to see if: ? The shape of one breast is different. ? The size of one breast is different. ? There are wrinkles, dips, and bumps in one breast and not the other. 6. Look at each breast for changes in the skin, such as: ? Redness. ? Scaly areas. 7. Look for changes in your nipples, such as: ? Liquid around the nipples. ? Bleeding. ? Dimpling. ? Redness. ? A change in where the nipples are. Feel for changes  1. Lie on your back on the floor. 2. Feel each breast. To do this, follow these steps: ? Pick a breast to feel. ? Put the arm closest to that breast above your head. ? Use your other arm to feel the nipple area of your breast. Feel the area with the pads of your three middle fingers by making small circles with your fingers. For the first circle, press lightly. For the second circle, press harder. For the third circle, press even harder. ? Keep making circles with  your fingers at the different pressures as you move down your breast. Stop when you feel your ribs. ? Move your fingers a little toward the center of your body. ? Start making circles with your fingers again, this time going up until you reach your collarbone. ? Keep making up-and-down circles until you reach your armpit. Remember to keep using the three pressures. ? Feel the other breast in the same way. 3. Sit or stand in the tub or shower. 4. With soapy water on your skin, feel each breast the same way you did in step 2 when you were lying on the floor. Write down what you find Writing down what you find can help you remember what to tell your doctor. Write down:  What is normal for each breast.  Any changes you find in each breast, including: ? The kind of changes you find. ? Whether you have pain. ? Size and location of any lumps.  When you last had your menstrual period. General tips  Check your breasts every month.  If you are breastfeeding, the best time to check your breasts is after you feed your baby or after you use a breast pump.  If you get menstrual periods, the best time to check your breasts is 5-7 days after your menstrual period is over.  With time, you will become comfortable with the self-exam, and you will begin to know if there are changes in your breasts. Contact a doctor if you:  See a change in the shape or size of your breasts or nipples.  See a change in the skin of your breast or nipples, such as red or scaly skin.  Have fluid coming from your nipples that is not normal.  Find a lump or thick area that was not there before.  Have pain in your breasts.  Have any concerns about your breast health. Summary  Breast self-awareness includes looking for changes in your breasts, as well as feeling for changes within your breasts.  Breast self-awareness should be done in front of a mirror in a well-lit room.  You should check your breasts every month.  If you get menstrual periods, the best time to check your breasts is 5-7 days after your menstrual period is over.  Let your doctor know of any changes you see in your breasts, including changes in size, changes on the skin, pain or tenderness, or fluid from your nipples that is not normal. This information is not  intended to replace advice given to you by your health care provider. Make sure you discuss any questions you have with your health care provider. Document Revised: 06/28/2018 Document Reviewed: 06/28/2018 Elsevier Patient Education  [place].

## 2020-10-11 ENCOUNTER — Encounter: Payer: Self-pay | Admitting: Obstetrics and Gynecology

## 2020-10-11 ENCOUNTER — Other Ambulatory Visit: Payer: Self-pay

## 2020-10-11 ENCOUNTER — Other Ambulatory Visit (HOSPITAL_COMMUNITY)
Admission: RE | Admit: 2020-10-11 | Discharge: 2020-10-11 | Disposition: A | Discharge status: Home / Self Care | Payer: BC Managed Care – PPO | Source: Ambulatory Visit | Attending: Obstetrics and Gynecology | Admitting: Obstetrics and Gynecology

## 2020-10-11 ENCOUNTER — Ambulatory Visit (INDEPENDENT_AMBULATORY_CARE_PROVIDER_SITE_OTHER): Payer: BC Managed Care – PPO | Admitting: Obstetrics and Gynecology

## 2020-10-11 VITALS — BP 107/74 | HR 78 | Ht 64.0 in | Wt 148.4 lb

## 2020-10-11 DIAGNOSIS — N926 Irregular menstruation, unspecified: Secondary | ICD-10-CM

## 2020-10-11 DIAGNOSIS — Z1231 Encounter for screening mammogram for malignant neoplasm of breast: Secondary | ICD-10-CM

## 2020-10-11 DIAGNOSIS — N951 Menopausal and female climacteric states: Secondary | ICD-10-CM

## 2020-10-11 DIAGNOSIS — Z124 Encounter for screening for malignant neoplasm of cervix: Secondary | ICD-10-CM

## 2020-10-11 DIAGNOSIS — Z01419 Encounter for gynecological examination (general) (routine) without abnormal findings: Secondary | ICD-10-CM | POA: Diagnosis not present

## 2020-10-11 DIAGNOSIS — Z13220 Encounter for screening for lipoid disorders: Secondary | ICD-10-CM

## 2020-10-11 DIAGNOSIS — N6012 Diffuse cystic mastopathy of left breast: Secondary | ICD-10-CM

## 2020-10-11 DIAGNOSIS — N6011 Diffuse cystic mastopathy of right breast: Secondary | ICD-10-CM

## 2020-10-11 NOTE — Progress Notes (Signed)
GYNECOLOGY ANNUAL PHYSICAL EXAM PROGRESS NOTE  Subjective:    Dana Salas is a 49 y.o. [id number] female who presents for an annual exam.  The patient is sexually active. The patient wears seatbelts: yes. The patient participates in regular exercise: no. Has the patient ever been transfused or tattooed?: no. The patient reports that there is not domestic violence in her life.      The patient has the following complaints today:  1. She reports that she she has been experiencing irregular cycles.  She notes that her cycles last anywhere from 25 to approximately 32 days.  She did have 1 cycle where she passed a large clot, however otherwise flow has been fairly normal.  She does also note a month where she wants to get her menses.  She still experiencing mild vasomotor and mood symptoms however notes they are manageable. 2. Reports that she established with a PCP this year. Has had some issues such as chest pain (seen I the ER in August), was diagnosed as a gas bubble), and had a colonoscopy performed which detected a precancerous polyp which was removed.     Menstrual History: Menarche age: 21 Patient's last menstrual period was 09/23/2020. Period Duration (Days): 3 Period Pattern: Regular Menstrual Flow: Heavy Menstrual Control: Maxi pad Menstrual Control Change Freq (Hours): 2-3 Dysmenorrhea: (!) Moderate Dysmenorrhea Symptoms: Cramping, HeadacheReports irregular occuring menses, can occur twice in a month occasionally, lasting 4-5 days, light flow. Also reports mild menstrual headaches.    Gynecologic History:  Last pap: approximate date 09/28/2017 and was normal. Denies h/o abnormal pap smears or STIs.  Contraception: female vasectomy Last mammogram: 11/13/2019.  Results were normal.  Last colonoscopy: 04/15/2020.  Results were: precancerous polyp noted. Due for repeat in 5 years.   OB History  Gravida Para Term Preterm AB Living  3 3 3     3   SAB TAB Ectopic Multiple Live  Births          3    # Outcome Date GA Lbr Len/2nd Weight Sex Delivery Anes PTL Lv  3 Term 2002   6 lb 10 oz (3.005 kg) M Vag-Spont   LIV  2 Term 2000   6 lb 13 oz (3.09 kg) M Vag-Spont   LIV  1 Term 1998   7 lb 11 oz (3.487 kg) M Vag-Spont   LIV    Past Medical History:  Diagnosis Date  . Boil, labium 33/2016  . Other abnormal blood chemistry    Iron,serum, elevated  . Pneumonia 11/03   outpatient    Past Surgical History:  Procedure Laterality Date  . COLONOSCOPY WITH PROPOFOL N/A 04/15/2020   TA (Tahiliani, Lennette Bihari, MD)    Family History  Problem Relation Age of Onset  . Hypertension Father   . COPD Father        smoker  . Heart attack Father   . High Cholesterol Father   . Stroke Father   . Thyroid disease Father   . Hypertension Sister   . High Cholesterol Sister   . AVM Sister        with bleed (at [facility])  . Cancer Maternal Grandmother        unsure type  . Breast cancer Paternal Grandmother   . Dementia Mother   . High Cholesterol Mother   . Hypertension Brother   . High Cholesterol Brother   . Early death Paternal Grandfather 44  . Heart attack Paternal Grandfather  Social History   Socioeconomic History  . Marital status: Married    Spouse name: Not on file  . Number of children: 3  . Years of education: Not on file  . Highest education level: Not on file  Occupational History  . Occupation: Programme researcher, broadcasting/film/video at [employer]  . Smoking status: Never Smoker  . Smokeless tobacco: Never Used  Vaping Use  . Vaping Use: Never used  Substance and Sexual Activity  . Alcohol use: Not Currently    Alcohol/week: 1.0 standard drink    Types: 1 Glasses of wine per week    Comment: occasionally  . Drug use: No  . Sexual activity: Yes    Birth control/protection: None, Surgical    Comment: vastcotomy  Other Topics Concern  . Not on file  Social History Narrative  . Not on file   Social Determinants of Health   Financial  Resource Strain:   . Difficulty of Paying Living Expenses: Not on file  Food Insecurity:   . Worried About Charity fundraiser in the Last Year: Not on file  . Ran Out of Food in the Last Year: Not on file  Transportation Needs:   . Lack of Transportation (Medical): Not on file  . Lack of Transportation (Non-Medical): Not on file  Physical Activity:   . Days of Exercise per Week: Not on file  . Minutes of Exercise per Session: Not on file  Stress:   . Feeling of Stress : Not on file  Social Connections:   . Frequency of Communication with Friends and Family: Not on file  . Frequency of Social Gatherings with Friends and Family: Not on file  . Attends Religious Services: Not on file  . Active Member of Clubs or Organizations: Not on file  . Attends Archivist Meetings: Not on file  . Marital Status: Not on file  Intimate Partner Violence:   . Fear of Current or Ex-Partner: Not on file  . Emotionally Abused: Not on file  . Physically Abused: Not on file  . Sexually Abused: Not on file    Current Outpatient Medications on File Prior to Visit  Medication Sig Dispense Refill  . cetirizine (ZYRTEC) 10 MG tablet Take 10 mg by mouth daily as needed for allergies.    . fluticasone (FLONASE) 50 MCG/ACT nasal spray Place 1 spray into both nostrils daily.     Marland Kitchen ibuprofen (ADVIL) 200 MG tablet Take 3 tablets (600 mg total) by mouth 2 (two) times daily as needed for headache.    . Multiple Vitamins-Minerals (EMERGEN-C IMMUNE) PACK Take 1 Package by mouth daily.     No current facility-administered medications on file prior to visit.    No Known Allergies   Review of Systems Constitutional: negative for chills, fatigue, fevers,, weight gain or weight loss. Positive for hot flushes and night sweats (mild, controllable).  Eyes: negative for irritation, redness and visual disturbance Ears, nose, mouth, throat, and face: negative for hearing loss, nasal congestion, snoring and  tinnitus Respiratory: negative for asthma, cough, sputum Cardiovascular: negative for chest pain, dyspnea, exertional chest pressure/discomfort, irregular heart beat, palpitations and syncope Gastrointestinal: negative for abdominal pain, change in bowel habits, nausea and vomiting Genitourinary: positive for abnormal menstrual periods (see HPI).   Negative for genital lesions, sexual problems and vaginal discharge, dysuria and urinary incontinence Integument/breast: negative for breast lump, breast tenderness and nipple discharge Hematologic/lymphatic: negative for bleeding and easy bruising Musculoskeletal:negative for back pain  and muscle weakness Neurological: negative for dizziness, headaches, vertigo and weakness Endocrine: negative for diabetic symptoms including polydipsia, polyuria and skin dryness Allergic/Immunologic: negative for hay fever and urticaria    Objective:  Blood pressure 107/74, pulse 78, height 5\' 4"  (1.626 m), weight 148 lb 6.4 oz (67.3 kg), last menstrual period 09/23/2020.  General Appearance:    Alert, cooperative, no distress, appears stated age  Head:    Normocephalic, without obvious abnormality, atraumatic  Eyes:    PERRL, conjunctiva/corneas clear, EOM's intact, both eyes  Ears:    Normal external ear canals, both ears  Nose:   Nares normal, septum midline, mucosa normal, no drainage or sinus tenderness  Throat:   Lips, mucosa, and tongue normal; teeth and gums normal  Neck:   Supple, symmetrical, trachea midline, no adenopathy; thyroid: no enlargement/tenderness/nodules; no carotid bruit or JVD  Back:     Symmetric, no curvature, ROM normal, no CVA tenderness  Lungs:     Clear to auscultation bilaterally, respirations unlabored  Chest Wall:    No tenderness or deformity   Heart:    Regular rate and rhythm, S1 and S2 normal, no murmur, rub or gallop  Breast Exam:    No tenderness, masses, or nipple abnormality. Fibrocystic changes noted in breasts  bilaterally, L>R  Abdomen:     Soft, non-tender, bowel sounds active all four quadrants, no masses, no organomegaly.    Genitalia:    Pelvic:external genitalia normal, vagina with lesions, discharge, or tenderness, rectovaginal septum  normal. Cervix normal in appearance, no cervical motion tenderness.  Uterus normal size, shape, consistency, mobile.  No adnexal masses or tenderness.    Rectal:    Normal external sphincter.  No hemorrhoids appreciated. Internal exam not done.   Extremities:   Extremities normal, atraumatic, no cyanosis or edema  Pulses:   2+ and symmetric all extremities  Skin:   Skin color, texture, turgor normal, no rashes or lesions  Lymph nodes:   Cervical, supraclavicular, and axillary nodes normal  Neurologic:   CNII-XII intact, normal strength, sensation and reflexes throughout     Labs:  Lab Results  Component Value Date   WBC 6.6 06/27/2020   HGB 13.1 06/27/2020   HCT 37.7 06/27/2020   MCV 93.5 06/27/2020   PLT 219 06/27/2020     Lab Results  Component Value Date   CREATININE 0.78 06/27/2020   BUN 17 06/27/2020   NA 138 06/27/2020   K 3.6 06/27/2020   CL 104 06/27/2020   CO2 25 06/27/2020    Lab Results  Component Value Date   ALT 14 09/08/2019   AST 18 09/08/2019   ALKPHOS 52 09/08/2019   BILITOT 0.5 09/08/2019    Lab Results  Component Value Date   CHOL 163 09/29/2018   HDL 67 09/29/2018   LDLCALC 83 09/29/2018   TRIG 66 09/29/2018   CHOLHDL 2.4 09/29/2018    Lab Results  Component Value Date   TSH 1.360 09/08/2019      Assessment:   Healthy female exam.  Breast cancer screening Fibrocystic changes of breast Perimenopausal Irregular menses  Plan:  Blood tests: See orders.  Breast self exam technique reviewed and patient encouraged to perform self-exam monthly. Discussed healthy lifestyle management.  Encouraged exercise and maintaining healthy balanced diet.  Pap smear performed today.   Patient with cycles, although  varying in cycle length, still within normal range. Also with mild vasomotor symptoms. Likely perimenopausal.  Does not affect quality of life, bleeding not  heavy usually. Patient to f/u if bleeding worsens.  Contraception: vasectomy. Mammogram scheduled for next month.  Continue routine screenings, with fibrocystic breast changes.  Declines flu vaccine.  COVID vaccine: declines.  Follow up in 1 year.   Rubie Maid, MD [facility]

## 2020-10-12 LAB — HEPATIC FUNCTION PANEL
ALT: 17 IU/L (ref 0–32)
AST: 18 IU/L (ref 0–40)
Albumin: 4.5 g/dL (ref 3.8–4.8)
Alkaline Phosphatase: 52 IU/L (ref 44–121)
Bilirubin Total: 0.6 mg/dL (ref 0.0–1.2)
Bilirubin, Direct: 0.16 mg/dL (ref 0.00–0.40)
Total Protein: 6.5 g/dL (ref 6.0–8.5)

## 2020-10-12 LAB — LIPID PANEL
Chol/HDL Ratio: 2.5 ratio (ref 0.0–4.4)
Cholesterol, Total: 191 mg/dL (ref 100–199)
HDL: 75 mg/dL (ref >39)
LDL Chol Calc (NIH): 103 mg/dL — ABNORMAL HIGH (ref 0–99)
Triglycerides: 73 mg/dL (ref 0–149)
VLDL Cholesterol Cal: 13 mg/dL (ref 5–40)

## 2020-10-16 LAB — CYTOLOGY - PAP
Comment: NEGATIVE
Diagnosis: NEGATIVE
High risk HPV: NEGATIVE

## 2020-11-11 ENCOUNTER — Ambulatory Visit
Admission: RE | Admit: 2020-11-11 | Discharge: 2020-11-11 | Disposition: A | Discharge status: Home / Self Care | Payer: BC Managed Care – PPO | Source: Ambulatory Visit | Attending: Obstetrics and Gynecology | Admitting: Obstetrics and Gynecology

## 2020-11-11 ENCOUNTER — Other Ambulatory Visit: Payer: Self-pay

## 2020-11-11 DIAGNOSIS — Z1231 Encounter for screening mammogram for malignant neoplasm of breast: Secondary | ICD-10-CM | POA: Diagnosis not present

## 2020-11-21 ENCOUNTER — Encounter: Payer: BC Managed Care – PPO | Admitting: Obstetrics and Gynecology

## 2021-02-20 ENCOUNTER — Other Ambulatory Visit: Payer: Self-pay

## 2021-02-20 ENCOUNTER — Encounter: Payer: Self-pay | Admitting: Podiatry

## 2021-02-20 ENCOUNTER — Other Ambulatory Visit: Payer: Self-pay | Admitting: Podiatry

## 2021-02-20 ENCOUNTER — Ambulatory Visit (INDEPENDENT_AMBULATORY_CARE_PROVIDER_SITE_OTHER): Payer: BC Managed Care – PPO | Admitting: Podiatry

## 2021-02-20 ENCOUNTER — Ambulatory Visit (INDEPENDENT_AMBULATORY_CARE_PROVIDER_SITE_OTHER): Payer: BC Managed Care – PPO

## 2021-02-20 DIAGNOSIS — M7750 Other enthesopathy of unspecified foot: Secondary | ICD-10-CM

## 2021-02-20 DIAGNOSIS — M7672 Peroneal tendinitis, left leg: Secondary | ICD-10-CM | POA: Diagnosis not present

## 2021-02-20 DIAGNOSIS — M7752 Other enthesopathy of left foot: Secondary | ICD-10-CM

## 2021-02-20 MED ORDER — TRIAMCINOLONE ACETONIDE 40 MG/ML IJ SUSP
20.0000 mg | Freq: Once | INTRAMUSCULAR | Status: AC
Start: 2021-02-20 — End: 2021-02-20
  Administered 2021-02-20: 20 mg

## 2021-02-20 MED ORDER — MELOXICAM 15 MG PO TABS: 15.0000 mg | ORAL_TABLET | Freq: Every day | ORAL | 3 refills | Status: DC

## 2021-02-20 MED ORDER — METHYLPREDNISOLONE 4 MG PO TBPK: ORAL_TABLET | ORAL | 0 refills | Status: DC

## 2021-02-20 NOTE — Progress Notes (Signed)
Subjective:  Patient ID: Dana Salas, female    DOB: 06-01-1971,  MRN: [id number] HPI Chief Complaint  Patient presents with  . Ankle Pain    Lateral ankle left - aching, swelling x few weeks, trip to [facility] early March, lots of walking, aching some up the back of the leg (calf), tried ice PRN  . New Patient (Initial Visit)    50 y.o. female presents with the above complaint.   ROS: Denies fever chills nausea vomiting muscle aches pains calf pain back pain chest pain shortness of breath.  Past Medical History:  Diagnosis Date  . Boil, labium 33/2016  . Other abnormal blood chemistry    Iron,serum, elevated  . Pneumonia 11/03   outpatient   Past Surgical History:  Procedure Laterality Date  . COLONOSCOPY WITH PROPOFOL N/A 04/15/2020   TA (Tahiliani, Lennette Bihari, MD)    Current Outpatient Medications:  .  meloxicam (MOBIC) 15 MG tablet, Take 1 tablet (15 mg total) by mouth daily., Disp: 30 tablet, Rfl: 3 .  methylPREDNISolone (MEDROL DOSEPAK) 4 MG TBPK tablet, 6 day dose pack - take as directed, Disp: 21 tablet, Rfl: 0 .  cetirizine (ZYRTEC) 10 MG tablet, Take 10 mg by mouth daily as needed for allergies., Disp: , Rfl:  .  fluticasone (FLONASE) 50 MCG/ACT nasal spray, Place 1 spray into both nostrils daily. , Disp: , Rfl:  .  ibuprofen (ADVIL) 200 MG tablet, Take 3 tablets (600 mg total) by mouth 2 (two) times daily as needed for headache., Disp: , Rfl:  .  Multiple Vitamins-Minerals (EMERGEN-C IMMUNE) PACK, Take 1 Package by mouth daily., Disp: , Rfl:   No Known Allergies Review of Systems Objective:  There were no vitals filed for this visit.  General: Well developed, nourished, in no acute distress, alert and oriented x3   Dermatological: Skin is warm, dry and supple bilateral. Nails x 10 are well maintained; remaining integument appears unremarkable at this time. There are no open sores, no preulcerative lesions, no rash or signs of infection present.  Vascular:  Dorsalis Pedis artery and Posterior Tibial artery pedal pulses are 2/4 bilateral with immedate capillary fill time. Pedal hair growth present. No varicosities and no lower extremity edema present bilateral.   Neruologic: Grossly intact via light touch bilateral. Vibratory intact via tuning fork bilateral. Protective threshold with Semmes Wienstein monofilament intact to all pedal sites bilateral. Patellar and Achilles deep tendon reflexes 2+ bilateral. No Babinski or clonus noted bilateral.   Musculoskeletal: No gross boney pedal deformities bilateral. No pain, crepitus, or limitation noted with foot and ankle range of motion bilateral. Muscular strength 5/5 in all groups tested bilateral.  She has some pain on palpation of the sinus tarsi of the left foot.  She also has a very sharp little punctated lesion along the peroneus brevis tendon just inferior to the sinus tarsi.  No this is visible on radiograph.  Gait: Unassisted, Nonantalgic.    Radiographs:  Radiographs taken today do not demonstrate any type of acute osseous abnormalities.  Normal osseous architecture of osseously mature individual.  Assessment & Plan:   Assessment: Discussed etiology pathology conservative versus surgical therapies.  At this point sinus tarsitis subtalar joint capsulitis and peroneal tendinitis.    Plan: At this point I injected the subtalar joint 20 mg Kenalog 5 mg Marcaine point maximal tenderness left foot.  Start her Medrol Dosepak followed by meloxicam.  Discussed appropriate shoe gear exercise and ice therapy she understands this is amenable  to it we will follow-up with me in 1 month.     [name] T. [place], [place]

## 2021-02-26 DIAGNOSIS — N924 Excessive bleeding in the premenopausal period: Secondary | ICD-10-CM

## 2021-03-25 ENCOUNTER — Other Ambulatory Visit: Payer: Self-pay

## 2021-03-25 ENCOUNTER — Ambulatory Visit (INDEPENDENT_AMBULATORY_CARE_PROVIDER_SITE_OTHER): Payer: BC Managed Care – PPO | Admitting: Podiatry

## 2021-03-25 ENCOUNTER — Encounter: Payer: Self-pay | Admitting: Podiatry

## 2021-03-25 DIAGNOSIS — M7752 Other enthesopathy of left foot: Secondary | ICD-10-CM

## 2021-03-25 NOTE — Progress Notes (Signed)
She presents today for follow-up of her subtalar joint capsulitis left.  She states that after the injection he gave me my whole foot was swollen.  She states that it went down and she took her steroidal anti-inflammatories and scared to take her meloxicam.  She states that she is only about 50% better at this point.  Objective: Vital signs are stable she is alert oriented x3 there is no erythema edema/drainage or odor.  She has tenderness on palpation of the sinus tarsi and on end range of motion of the subtalar joint.  Assessment: Subtalar joint capsulitis.  Plan: At this point she does not want another injection but I will go ahead and recommend that she continue her meloxicam leaving her worries regarding this today follow-up with her in 1 month if necessary.

## 2021-04-16 ENCOUNTER — Ambulatory Visit
Admission: RE | Admit: 2021-04-16 | Discharge: 2021-04-16 | Disposition: A | Discharge status: Home / Self Care | Payer: BC Managed Care – PPO | Source: Ambulatory Visit | Attending: Obstetrics and Gynecology | Admitting: Obstetrics and Gynecology

## 2021-04-16 ENCOUNTER — Other Ambulatory Visit: Payer: Self-pay

## 2021-04-16 DIAGNOSIS — N888 Other specified noninflammatory disorders of cervix uteri: Secondary | ICD-10-CM | POA: Diagnosis not present

## 2021-04-16 DIAGNOSIS — N924 Excessive bleeding in the premenopausal period: Secondary | ICD-10-CM | POA: Diagnosis not present

## 2021-04-29 ENCOUNTER — Ambulatory Visit: Payer: BC Managed Care – PPO | Admitting: Podiatry

## 2021-06-05 ENCOUNTER — Encounter: Payer: BC Managed Care – PPO | Admitting: Obstetrics and Gynecology

## 2021-07-04 ENCOUNTER — Encounter: Payer: Self-pay | Admitting: Family Medicine

## 2021-07-04 DIAGNOSIS — R519 Headache, unspecified: Secondary | ICD-10-CM

## 2021-07-04 DIAGNOSIS — R635 Abnormal weight gain: Secondary | ICD-10-CM

## 2021-07-04 DIAGNOSIS — R7989 Other specified abnormal findings of blood chemistry: Secondary | ICD-10-CM

## 2021-07-04 DIAGNOSIS — N951 Menopausal and female climacteric states: Secondary | ICD-10-CM

## 2021-07-07 NOTE — Addendum Note (Signed)
Addended by: Ria Bush on: 07/07/2021 07:28 AM   Modules accepted: Orders

## 2021-07-09 DIAGNOSIS — M5416 Radiculopathy, lumbar region: Secondary | ICD-10-CM | POA: Diagnosis not present

## 2021-07-09 DIAGNOSIS — M5136 Other intervertebral disc degeneration, lumbar region: Secondary | ICD-10-CM | POA: Diagnosis not present

## 2021-07-09 DIAGNOSIS — M9903 Segmental and somatic dysfunction of lumbar region: Secondary | ICD-10-CM | POA: Diagnosis not present

## 2021-07-09 DIAGNOSIS — M62830 Muscle spasm of back: Secondary | ICD-10-CM | POA: Diagnosis not present

## 2021-07-10 DIAGNOSIS — M5416 Radiculopathy, lumbar region: Secondary | ICD-10-CM | POA: Diagnosis not present

## 2021-07-10 DIAGNOSIS — M9903 Segmental and somatic dysfunction of lumbar region: Secondary | ICD-10-CM | POA: Diagnosis not present

## 2021-07-10 DIAGNOSIS — M5136 Other intervertebral disc degeneration, lumbar region: Secondary | ICD-10-CM | POA: Diagnosis not present

## 2021-07-10 DIAGNOSIS — M62830 Muscle spasm of back: Secondary | ICD-10-CM | POA: Diagnosis not present

## 2021-07-14 DIAGNOSIS — M5416 Radiculopathy, lumbar region: Secondary | ICD-10-CM | POA: Diagnosis not present

## 2021-07-14 DIAGNOSIS — M62830 Muscle spasm of back: Secondary | ICD-10-CM | POA: Diagnosis not present

## 2021-07-14 DIAGNOSIS — M5136 Other intervertebral disc degeneration, lumbar region: Secondary | ICD-10-CM | POA: Diagnosis not present

## 2021-07-14 DIAGNOSIS — M9903 Segmental and somatic dysfunction of lumbar region: Secondary | ICD-10-CM | POA: Diagnosis not present

## 2021-07-17 DIAGNOSIS — M62830 Muscle spasm of back: Secondary | ICD-10-CM | POA: Diagnosis not present

## 2021-07-17 DIAGNOSIS — M9903 Segmental and somatic dysfunction of lumbar region: Secondary | ICD-10-CM | POA: Diagnosis not present

## 2021-07-17 DIAGNOSIS — M5416 Radiculopathy, lumbar region: Secondary | ICD-10-CM | POA: Diagnosis not present

## 2021-07-17 DIAGNOSIS — M5136 Other intervertebral disc degeneration, lumbar region: Secondary | ICD-10-CM | POA: Diagnosis not present

## 2021-07-18 DIAGNOSIS — M5136 Other intervertebral disc degeneration, lumbar region: Secondary | ICD-10-CM | POA: Diagnosis not present

## 2021-07-18 DIAGNOSIS — M62830 Muscle spasm of back: Secondary | ICD-10-CM | POA: Diagnosis not present

## 2021-07-18 DIAGNOSIS — M5416 Radiculopathy, lumbar region: Secondary | ICD-10-CM | POA: Diagnosis not present

## 2021-07-18 DIAGNOSIS — M9903 Segmental and somatic dysfunction of lumbar region: Secondary | ICD-10-CM | POA: Diagnosis not present

## 2021-07-21 DIAGNOSIS — M5416 Radiculopathy, lumbar region: Secondary | ICD-10-CM | POA: Diagnosis not present

## 2021-07-21 DIAGNOSIS — M9903 Segmental and somatic dysfunction of lumbar region: Secondary | ICD-10-CM | POA: Diagnosis not present

## 2021-07-21 DIAGNOSIS — M5136 Other intervertebral disc degeneration, lumbar region: Secondary | ICD-10-CM | POA: Diagnosis not present

## 2021-07-21 DIAGNOSIS — M62830 Muscle spasm of back: Secondary | ICD-10-CM | POA: Diagnosis not present

## 2021-07-23 DIAGNOSIS — M62830 Muscle spasm of back: Secondary | ICD-10-CM | POA: Diagnosis not present

## 2021-07-23 DIAGNOSIS — M5136 Other intervertebral disc degeneration, lumbar region: Secondary | ICD-10-CM | POA: Diagnosis not present

## 2021-07-23 DIAGNOSIS — M9903 Segmental and somatic dysfunction of lumbar region: Secondary | ICD-10-CM | POA: Diagnosis not present

## 2021-07-23 DIAGNOSIS — M5416 Radiculopathy, lumbar region: Secondary | ICD-10-CM | POA: Diagnosis not present

## 2021-07-24 ENCOUNTER — Ambulatory Visit (INDEPENDENT_AMBULATORY_CARE_PROVIDER_SITE_OTHER): Payer: BC Managed Care – PPO | Admitting: Dermatology

## 2021-07-24 ENCOUNTER — Other Ambulatory Visit: Payer: Self-pay

## 2021-07-24 DIAGNOSIS — L820 Inflamed seborrheic keratosis: Secondary | ICD-10-CM

## 2021-07-24 DIAGNOSIS — Q825 Congenital non-neoplastic nevus: Secondary | ICD-10-CM | POA: Diagnosis not present

## 2021-07-24 DIAGNOSIS — M5416 Radiculopathy, lumbar region: Secondary | ICD-10-CM | POA: Diagnosis not present

## 2021-07-24 DIAGNOSIS — L578 Other skin changes due to chronic exposure to nonionizing radiation: Secondary | ICD-10-CM

## 2021-07-24 DIAGNOSIS — M9903 Segmental and somatic dysfunction of lumbar region: Secondary | ICD-10-CM | POA: Diagnosis not present

## 2021-07-24 DIAGNOSIS — M5136 Other intervertebral disc degeneration, lumbar region: Secondary | ICD-10-CM | POA: Diagnosis not present

## 2021-07-24 DIAGNOSIS — M62830 Muscle spasm of back: Secondary | ICD-10-CM | POA: Diagnosis not present

## 2021-07-24 DIAGNOSIS — L821 Other seborrheic keratosis: Secondary | ICD-10-CM | POA: Diagnosis not present

## 2021-07-24 NOTE — Progress Notes (Signed)
   Follow-Up Visit   Subjective  Dana Salas is a 50 y.o. female who presents for the following: Other (Keratosis? Face and trunk - irritating).  The following portions of the chart were reviewed this encounter and updated as appropriate:   Tobacco  Allergies  Meds  Problems  Med Hx  Surg Hx  Fam Hx     Review of Systems:  No other skin or systemic complaints except as noted in HPI or Assessment and Plan.  Objective  Well appearing patient in no apparent distress; mood and affect are within normal limits.  A focused examination was performed including face, trunk. Relevant physical exam findings are noted in the Assessment and Plan.  Trunk, right temple - Total 20 (20) Erythematous keratotic or waxy stuck-on papule or plaque.   Right Upper Back Blanched macule   Assessment & Plan   Seborrheic Keratoses - Stuck-on, waxy, tan-brown papules and/or plaques  - Benign-appearing - Discussed benign etiology and prognosis. - Observe - Call for any changes  Inflamed seborrheic keratosis Trunk, right temple - Total 20  Destruction of lesion - Trunk, right temple - Total 20 Complexity: simple   Destruction method: cryotherapy   Informed consent: discussed and consent obtained   Timeout:  patient name, date of birth, surgical site, and procedure verified Lesion destroyed using liquid nitrogen: Yes   Region frozen until ice ball extended beyond lesion: Yes   Outcome: patient tolerated procedure well with no complications   Post-procedure details: wound care instructions given    Nevus anemicus Right Upper Back Benign appearing. Observe  Actinic Damage - chronic, secondary to cumulative UV radiation exposure/sun exposure over time - diffuse scaly erythematous macules with underlying dyspigmentation - Recommend daily broad spectrum sunscreen SPF 30+ to sun-exposed areas, reapply every 2 hours as needed.  - Recommend staying in the shade or wearing long sleeves, sun  glasses (UVA+UVB protection) and wide brim hats (4-inch brim around the entire circumference of the hat). - Call for new or changing lesions.  Return in about 6 weeks (around 09/04/2021) for ISK follow up.  I, Ashok Cordia, CMA, am acting as scribe for Sarina Ser, MD .  Documentation: I have reviewed the above documentation for accuracy and completeness, and I agree with the above.  Sarina Ser, MD

## 2021-07-24 NOTE — Patient Instructions (Signed)

## 2021-07-29 DIAGNOSIS — M62830 Muscle spasm of back: Secondary | ICD-10-CM | POA: Diagnosis not present

## 2021-07-29 DIAGNOSIS — M9903 Segmental and somatic dysfunction of lumbar region: Secondary | ICD-10-CM | POA: Diagnosis not present

## 2021-07-29 DIAGNOSIS — M5416 Radiculopathy, lumbar region: Secondary | ICD-10-CM | POA: Diagnosis not present

## 2021-07-29 DIAGNOSIS — M5136 Other intervertebral disc degeneration, lumbar region: Secondary | ICD-10-CM | POA: Diagnosis not present

## 2021-07-31 ENCOUNTER — Encounter: Payer: Self-pay | Admitting: Dermatology

## 2021-07-31 DIAGNOSIS — M62830 Muscle spasm of back: Secondary | ICD-10-CM | POA: Diagnosis not present

## 2021-07-31 DIAGNOSIS — M5136 Other intervertebral disc degeneration, lumbar region: Secondary | ICD-10-CM | POA: Diagnosis not present

## 2021-07-31 DIAGNOSIS — M5416 Radiculopathy, lumbar region: Secondary | ICD-10-CM | POA: Diagnosis not present

## 2021-07-31 DIAGNOSIS — M9903 Segmental and somatic dysfunction of lumbar region: Secondary | ICD-10-CM | POA: Diagnosis not present

## 2021-08-01 DIAGNOSIS — M62830 Muscle spasm of back: Secondary | ICD-10-CM | POA: Diagnosis not present

## 2021-08-01 DIAGNOSIS — M5416 Radiculopathy, lumbar region: Secondary | ICD-10-CM | POA: Diagnosis not present

## 2021-08-01 DIAGNOSIS — M9903 Segmental and somatic dysfunction of lumbar region: Secondary | ICD-10-CM | POA: Diagnosis not present

## 2021-08-01 DIAGNOSIS — M5136 Other intervertebral disc degeneration, lumbar region: Secondary | ICD-10-CM | POA: Diagnosis not present

## 2021-08-03 ENCOUNTER — Encounter: Payer: Self-pay | Admitting: Family Medicine

## 2021-08-04 DIAGNOSIS — M62830 Muscle spasm of back: Secondary | ICD-10-CM | POA: Diagnosis not present

## 2021-08-04 DIAGNOSIS — M5416 Radiculopathy, lumbar region: Secondary | ICD-10-CM | POA: Diagnosis not present

## 2021-08-04 DIAGNOSIS — M5136 Other intervertebral disc degeneration, lumbar region: Secondary | ICD-10-CM | POA: Diagnosis not present

## 2021-08-04 DIAGNOSIS — M9903 Segmental and somatic dysfunction of lumbar region: Secondary | ICD-10-CM | POA: Diagnosis not present

## 2021-08-05 DIAGNOSIS — M5416 Radiculopathy, lumbar region: Secondary | ICD-10-CM | POA: Diagnosis not present

## 2021-08-05 DIAGNOSIS — M5136 Other intervertebral disc degeneration, lumbar region: Secondary | ICD-10-CM | POA: Diagnosis not present

## 2021-08-05 DIAGNOSIS — M62830 Muscle spasm of back: Secondary | ICD-10-CM | POA: Diagnosis not present

## 2021-08-05 DIAGNOSIS — M9903 Segmental and somatic dysfunction of lumbar region: Secondary | ICD-10-CM | POA: Diagnosis not present

## 2021-08-06 ENCOUNTER — Telehealth: Payer: Self-pay | Admitting: Obstetrics and Gynecology

## 2021-08-06 ENCOUNTER — Encounter: Payer: Self-pay | Admitting: Obstetrics and Gynecology

## 2021-08-06 ENCOUNTER — Ambulatory Visit (INDEPENDENT_AMBULATORY_CARE_PROVIDER_SITE_OTHER): Payer: BC Managed Care – PPO | Admitting: Obstetrics and Gynecology

## 2021-08-06 ENCOUNTER — Other Ambulatory Visit: Payer: Self-pay

## 2021-08-06 VITALS — BP 108/74 | HR 78 | Ht 64.0 in | Wt 155.0 lb

## 2021-08-06 DIAGNOSIS — M62830 Muscle spasm of back: Secondary | ICD-10-CM | POA: Diagnosis not present

## 2021-08-06 DIAGNOSIS — N951 Menopausal and female climacteric states: Secondary | ICD-10-CM

## 2021-08-06 DIAGNOSIS — M9903 Segmental and somatic dysfunction of lumbar region: Secondary | ICD-10-CM | POA: Diagnosis not present

## 2021-08-06 DIAGNOSIS — Z01419 Encounter for gynecological examination (general) (routine) without abnormal findings: Secondary | ICD-10-CM

## 2021-08-06 DIAGNOSIS — M5136 Other intervertebral disc degeneration, lumbar region: Secondary | ICD-10-CM | POA: Diagnosis not present

## 2021-08-06 DIAGNOSIS — N926 Irregular menstruation, unspecified: Secondary | ICD-10-CM | POA: Diagnosis not present

## 2021-08-06 DIAGNOSIS — Z1231 Encounter for screening mammogram for malignant neoplasm of breast: Secondary | ICD-10-CM

## 2021-08-06 DIAGNOSIS — M5416 Radiculopathy, lumbar region: Secondary | ICD-10-CM | POA: Diagnosis not present

## 2021-08-06 DIAGNOSIS — N6011 Diffuse cystic mastopathy of right breast: Secondary | ICD-10-CM

## 2021-08-06 DIAGNOSIS — N6012 Diffuse cystic mastopathy of left breast: Secondary | ICD-10-CM

## 2021-08-06 NOTE — Telephone Encounter (Signed)
Dana Salas states she forget to mention to Dr. Marcelline Mates she needs a mammogram.  I gave her a [facility] card but she would like the order placed please.

## 2021-08-06 NOTE — Addendum Note (Signed)
Addended by: Edwyna Shell on: 08/06/2021 04:56 PM   Modules accepted: Orders

## 2021-08-06 NOTE — Progress Notes (Signed)
GYNECOLOGY ANNUAL PHYSICAL EXAM PROGRESS NOTE  Subjective:    Dana Salas is a 50 y.o. G25P3003 female who presents for an annual exam.  The patient is sexually active. The patient wears seatbelts: yes. The patient participates in regular exercise: no. Has the patient ever been transfused or tattooed?: no. The patient reports that there is not domestic violence in her life.      The patient has the following complaints today:  1. Last year patient report experiencing mild vasomotor and mood symptoms. Patient however notes they are manageable. 2. Reports that she established with a PCP last year. Pt had a colonoscopy last year performed which detected a precancerous polyp which was removed.     Menstrual History: Menarche age: 73 Patient's last menstrual period was 06/04/2021 (exact date). Period Duration (Days): 4 Period Pattern: (!) Irregular Menstrual Flow: Heavy, Moderate, Light Menstrual Control: Maxi pad Menstrual Control Change Freq (Hours): 8 Dysmenorrhea: (!) Moderate Dysmenorrhea Symptoms: Cramping, Headache   Gynecologic History:  Last pap: 10/11/2020 and was normal. Denies h/o abnormal pap smears or STIs.  Contraception: female vasectomy Last mammogram: 11/11/2020.  Results were normal.  Last colonoscopy: 04/15/2020.  Results were: precancerous polyp noted. Due for repeat in 4 years.    OB History  Gravida Para Term Preterm AB Living  '3 3 3     3  '$ SAB IAB Ectopic Multiple Live Births          3    # Outcome Date GA Lbr Len/2nd Weight Sex Delivery Anes PTL Lv  3 Term 2002   6 lb 10 oz (3.005 kg) M Vag-Spont   LIV  2 Term 2000   6 lb 13 oz (3.09 kg) M CS-Vac   LIV  1 Term 1998   7 lb 11 oz (3.487 kg) M Vag-Spont   LIV    Past Medical History:  Diagnosis Date   Boil, labium 33/2016   Other abnormal blood chemistry    Iron,serum, elevated   Pneumonia 11/03   outpatient    Past Surgical History:  Procedure Laterality Date   COLONOSCOPY WITH PROPOFOL  N/A 04/15/2020   TA, rpt 5 yrs (Tahiliani, Lennette Bihari, MD)    Family History  Problem Relation Age of Onset   Hypertension Father    COPD Father        smoker   Heart attack Father    High Cholesterol Father    Stroke Father    Thyroid disease Father    Hypertension Sister    High Cholesterol Sister    AVM Sister        with bleed (at [facility])   Cancer Maternal Grandmother        unsure type   Breast cancer Paternal Grandmother    Dementia Mother    High Cholesterol Mother    Hypertension Brother    High Cholesterol Brother    Early death Paternal Grandfather 41   Heart attack Paternal Grandfather     Social History   Socioeconomic History   Marital status: Married    Spouse name: Not on file   Number of children: 3   Years of education: Not on file   Highest education level: Not on file  Occupational History   Occupation: Programme researcher, broadcasting/film/video at [employer]  Tobacco Use   Smoking status: Never   Smokeless tobacco: Never  Vaping Use   Vaping Use: Never used  Substance and Sexual Activity   Alcohol use: Not  Currently    Alcohol/week: 1.0 standard drink    Types: 1 Glasses of wine per week    Comment: occasionally   Drug use: No   Sexual activity: Yes    Birth control/protection: None, Surgical    Comment: vastcotomy  Other Topics Concern   Not on file  Social History Narrative   Not on file   Social Determinants of Health   Financial Resource Strain: Not on file  Food Insecurity: Not on file  Transportation Needs: Not on file  Physical Activity: Not on file  Stress: Not on file  Social Connections: Not on file  Intimate Partner Violence: Not on file    Current Outpatient Medications on File Prior to Visit  Medication Sig Dispense Refill   cetirizine (ZYRTEC) 10 MG tablet Take 10 mg by mouth daily as needed for allergies.     fluticasone (FLONASE) 50 MCG/ACT nasal spray Place 1 spray into both nostrils daily.      ibuprofen (ADVIL) 200 MG tablet Take  3 tablets (600 mg total) by mouth 2 (two) times daily as needed for headache.     meloxicam (MOBIC) 15 MG tablet Take 1 tablet (15 mg total) by mouth daily. (Patient taking differently: Take 15 mg by mouth as needed.) 30 tablet 3   Multiple Vitamins-Minerals (EMERGEN-C IMMUNE) PACK Take 1 Package by mouth daily.     No current facility-administered medications on file prior to visit.    No Known Allergies   Review of Systems Constitutional: negative for chills, fatigue, fevers,, weight gain or weight loss. Positive for hot flushes and night sweats (mild, controllable).  Eyes: negative for irritation, redness and visual disturbance Ears, nose, mouth, throat, and face: negative for hearing loss, nasal congestion, snoring and tinnitus Respiratory: negative for asthma, cough, sputum Cardiovascular: negative for chest pain, dyspnea, exertional chest pressure/discomfort, irregular heart beat, palpitations and syncope Gastrointestinal: negative for abdominal pain, change in bowel habits, nausea and vomiting Genitourinary: positive for irregular menstrual periods (skipping 2-3 months at a time).   Negative for genital lesions, sexual problems and vaginal discharge, dysuria and urinary incontinence Integument/breast: negative for breast lump, breast tenderness and nipple discharge Hematologic/lymphatic: negative for bleeding and easy bruising Musculoskeletal:negative for back pain and muscle weakness Neurological: negative for dizziness, headaches, vertigo and weakness Endocrine: negative for diabetic symptoms including polydipsia, polyuria and skin dryness Allergic/Immunologic: negative for hay fever and urticaria    Objective:  Blood pressure 108/74, pulse 78, height '5\' 4"'$  (1.626 m), weight 155 lb (70.3 kg), last menstrual period 06/04/2021.  Body mass index is 26.61 kg/m.   General Appearance:    Alert, cooperative, no distress, appears stated age, overweight  Head:    Normocephalic, without  obvious abnormality, atraumatic  Eyes:    PERRL, conjunctiva/corneas clear, EOM's intact, both eyes  Ears:    Normal external ear canals, both ears  Nose:   Nares normal, septum midline, mucosa normal, no drainage or sinus tenderness  Throat:   Lips, mucosa, and tongue normal; teeth and gums normal  Neck:   Supple, symmetrical, trachea midline, no adenopathy; thyroid: no enlargement/tenderness/nodules; no carotid bruit or JVD  Back:     Symmetric, no curvature, ROM normal, no CVA tenderness  Lungs:     Clear to auscultation bilaterally, respirations unlabored  Chest Wall:    No tenderness or deformity   Heart:    Regular rate and rhythm, S1 and S2 normal, no murmur, rub or gallop  Breast Exam:    No  tenderness, masses, or nipple abnormality. Fibrocystic changes noted in breasts bilaterally, L>R  Abdomen:     Soft, non-tender, bowel sounds active all four quadrants, no masses, no organomegaly.    Genitalia:    Pelvic:external genitalia normal, vagina with lesions, discharge, or tenderness, rectovaginal septum  normal. Cervix normal in appearance, no cervical motion tenderness.  Uterus normal size, shape, consistency, mobile.  No adnexal masses or tenderness.    Rectal:    Normal external sphincter.  No hemorrhoids appreciated. Internal exam not done.   Extremities:   Extremities normal, atraumatic, no cyanosis or edema  Pulses:   2+ and symmetric all extremities  Skin:   Skin color, texture, turgor normal, no rashes or lesions  Lymph nodes:   Cervical, supraclavicular, and axillary nodes normal  Neurologic:   CNII-XII intact, normal strength, sensation and reflexes throughout     Labs:  Lab Results  Component Value Date   WBC 6.6 06/27/2020   HGB 13.1 06/27/2020   HCT 37.7 06/27/2020   MCV 93.5 06/27/2020   PLT 219 06/27/2020     Lab Results  Component Value Date   CREATININE 0.78 06/27/2020   BUN 17 06/27/2020   NA 138 06/27/2020   K 3.6 06/27/2020   CL 104 06/27/2020   CO2 25  06/27/2020    Lab Results  Component Value Date   ALT 17 10/11/2020   AST 18 10/11/2020   ALKPHOS 52 10/11/2020   BILITOT 0.6 10/11/2020    Lab Results  Component Value Date   CHOL 191 10/11/2020   HDL 75 10/11/2020   LDLCALC 103 (H) 10/11/2020   TRIG 73 10/11/2020   CHOLHDL 2.5 10/11/2020    Lab Results  Component Value Date   TSH 1.360 09/08/2019      Assessment:   Healthy female exam.  Breast cancer screening Fibrocystic changes of breast Perimenopausal Irregular menses  Plan:  Blood tests: Will have completed with PCP. Breast self exam technique reviewed and patient encouraged to perform self-exam monthly. Discussed healthy lifestyle management.  Encouraged exercise and maintaining healthy balanced diet.  Pap smear up to date. .   Patient with intermittent cycles, likely perimenopausal.  Also with mild vasomotor symptoms, manageable. Symptoms do not affect quality of life. Patient to f/u if treatment desired. Contraception: vasectomy. Mammogram completed 11/11/20;. Order placed. Continue routine screenings, with fibrocystic breast changes.  Declines flu vaccine.  COVID vaccine: declines.  Follow up in 1 year.   Edwyna Shell, LPN [facility]

## 2021-08-06 NOTE — Patient Instructions (Signed)
Breast Self-Awareness Breast self-awareness is knowing how your breasts look and feel. Doing breast self-awareness is important. It allows you to catch a breast problem early while it is still small and can be treated. All women should do breast self-awareness, including women who have had breast implants. Tell your doctor if you notice a change in your breasts. What you need: A mirror. A well-lit room. How to do a breast self-exam A breast self-exam is one way to learn what is normal for your breasts and to check for changes. To do a breast self-exam: Look for changes  Take off all the clothes above your waist. Stand in front of a mirror in a room with good lighting. Put your hands on your hips. Push your hands down. Look at your breasts and nipples in the mirror to see if one breast or nipple looks different from the other. Check to see if: The shape of one breast is different. The size of one breast is different. There are wrinkles, dips, and bumps in one breast and not the other. Look at each breast for changes in the skin, such as: Redness. Scaly areas. Look for changes in your nipples, such as: Liquid around the nipples. Bleeding. Dimpling. Redness. A change in where the nipples are. Feel for changes  Lie on your back on the floor. Feel each breast. To do this, follow these steps: Pick a breast to feel. Put the arm closest to that breast above your head. Use your other arm to feel the nipple area of your breast. Feel the area with the pads of your three middle fingers by making small circles with your fingers. For the first circle, press lightly. For the second circle, press harder. For the third circle, press even harder. Keep making circles with your fingers at the different pressures as you move down your breast. Stop when you feel your ribs. Move your fingers a little toward the center of your body. Start making circles with your fingers again, this time going up until  you reach your collarbone. Keep making up-and-down circles until you reach your armpit. Remember to keep using the three pressures. Feel the other breast in the same way. Sit or stand in the tub or shower. With soapy water on your skin, feel each breast the same way you did in step 2 when you were lying on the floor. Write down what you find Writing down what you find can help you remember what to tell your doctor. Write down: What is normal for each breast. Any changes you find in each breast, including: The kind of changes you find. Whether you have pain. Size and location of any lumps. When you last had your menstrual period. General tips Check your breasts every month. If you are breastfeeding, the best time to check your breasts is after you feed your baby or after you use a breast pump. If you get menstrual periods, the best time to check your breasts is 5-7 days after your menstrual period is over. With time, you will become comfortable with the self-exam, and you will begin to know if there are changes in your breasts. Contact a doctor if you: See a change in the shape or size of your breasts or nipples. See a change in the skin of your breast or nipples, such as red or scaly skin. Have fluid coming from your nipples that is not normal. Find a lump or thick area that was not there before. Have pain in   your breasts. Have any concerns about your breast health. Summary Breast self-awareness includes looking for changes in your breasts, as well as feeling for changes within your breasts. Breast self-awareness should be done in front of a mirror in a well-lit room. You should check your breasts every month. If you get menstrual periods, the best time to check your breasts is 5-7 days after your menstrual period is over. Let your doctor know of any changes you see in your breasts, including changes in size, changes on the skin, pain or tenderness, or fluid from your nipples that is not  normal. This information is not intended to replace advice given to you by your health care provider. Make sure you discuss any questions you have with your health care provider. Document Revised: 06/28/2018 Document Reviewed: 06/28/2018 Elsevier Patient Education  2022 [employer] Inc[employer]    Preventive Care 40-64 Years Old, Female Preventive care refers to lifestyle choices and visits with your health care provider that can promote health and wellness. This includes: A yearly physical exam. This is also called an annual wellness visit. Regular dental and eye exams. Immunizations. Screening for certain conditions. Healthy lifestyle choices, such as: Eating a healthy diet. Getting regular exercise. Not using drugs or products that contain nicotine and tobacco. Limiting alcohol use. What can I expect for my preventive care visit? Physical exam Your health care provider will check your: Height and weight. These may be used to calculate your BMI (body mass index). BMI is a measurement that tells if you are at a healthy weight. Heart rate and blood pressure. Body temperature. Skin for abnormal spots. Counseling Your health care provider may ask you questions about your: Past medical problems. Family's medical history. Alcohol, tobacco, and drug use. Emotional well-being. Home life and relationship well-being. Sexual activity. Diet, exercise, and sleep habits. Work and work environment. Access to firearms. Method of birth control. Menstrual cycle. Pregnancy history. What immunizations do I need? Vaccines are usually given at various ages, according to a schedule. Your health care provider will recommend vaccines for you based on your age, medical history, and lifestyle or other factors, such as travel or where you work. What tests do I need? Blood tests Lipid and cholesterol levels. These may be checked every 5 years, or more often if you are over 50 years old. Hepatitis C  test. Hepatitis B test. Screening Lung cancer screening. You may have this screening every year starting at age 55 if you have a 30-pack-year history of smoking and currently smoke or have quit within the past 15 years. Colorectal cancer screening. All adults should have this screening starting at age 50 and continuing until age 75. Your health care provider may recommend screening at age 45 if you are at increased risk. You will have tests every 1-10 years, depending on your results and the type of screening test. Diabetes screening. This is done by checking your blood sugar (glucose) after you have not eaten for a while (fasting). You may have this done every 1-3 years. Mammogram. This may be done every 1-2 years. Talk with your health care provider about when you should start having regular mammograms. This may depend on whether you have a family history of breast cancer. BRCA-related cancer screening. This may be done if you have a family history of breast, ovarian, tubal, or peritoneal cancers. Pelvic exam and Pap test. This may be done every 3 years starting at age 21. Starting at age 30, this may be done every   every 5 years if you have a Pap test in combination with an HPV test. Other tests STD (sexually transmitted disease) testing, if you are at risk. Bone density scan. This is done to screen for osteoporosis. You may have this scan if you are at high risk for osteoporosis. Talk with your health care provider about your test results, treatment options, and if necessary, the need for more tests. Follow these instructions at home: Eating and drinking  Eat a diet that includes fresh fruits and vegetables, whole grains, lean protein, and low-fat dairy products. Take vitamin and mineral supplements as recommended by your health care provider. Do not drink alcohol if: Your health care provider tells you not to drink. You are pregnant, may be pregnant, or are planning to become pregnant. If  you drink alcohol: Limit how much you have to 0-1 drink a day. Be aware of how much alcohol is in your drink. In the [place] one drink equals one 12 oz bottle of beer (355 mL), one 5 oz glass of wine (148 mL), or one 1 oz glass of hard liquor (44 mL). Lifestyle Take daily care of your teeth and gums. Brush your teeth every morning and night with fluoride toothpaste. Floss one time each day. Stay active. Exercise for at least 30 minutes 5 or more days each week. Do not use any products that contain nicotine or tobacco, such as cigarettes, e-cigarettes, and chewing tobacco. If you need help quitting, ask your health care provider. Do not use drugs. If you are sexually active, practice safe sex. Use a condom or other form of protection to prevent STIs (sexually transmitted infections). If you do not wish to become pregnant, use a form of birth control. If you plan to become pregnant, see your health care provider for a prepregnancy visit. If told by your health care provider, take low-dose aspirin daily starting at age 54. Find healthy ways to cope with stress, such as: Meditation, yoga, or listening to music. Journaling. Talking to a trusted person. Spending time with friends and family. Safety Always wear your seat belt while driving or riding in a vehicle. Do not drive: If you have been drinking alcohol. Do not ride with someone who has been drinking. When you are tired or distracted. While texting. Wear a helmet and other protective equipment during sports activities. If you have firearms in your house, make sure you follow all gun safety procedures. What's next? Visit your health care provider once a year for an annual wellness visit. Ask your health care provider how often you should have your eyes and teeth checked. Stay up to date on all vaccines. This information is not intended to replace advice given to you by your health care provider. Make sure you discuss any questions you have  with your health care provider. Document Revised: 01/17/2021 Document Reviewed: 07/21/2018 Elsevier Patient Education  2022 [employer].

## 2021-08-06 NOTE — Telephone Encounter (Signed)
Pt is aware that her mammogram order was placed during her office visit today.

## 2021-08-11 DIAGNOSIS — M9903 Segmental and somatic dysfunction of lumbar region: Secondary | ICD-10-CM | POA: Diagnosis not present

## 2021-08-11 DIAGNOSIS — M5416 Radiculopathy, lumbar region: Secondary | ICD-10-CM | POA: Diagnosis not present

## 2021-08-11 DIAGNOSIS — M5136 Other intervertebral disc degeneration, lumbar region: Secondary | ICD-10-CM | POA: Diagnosis not present

## 2021-08-11 DIAGNOSIS — M62830 Muscle spasm of back: Secondary | ICD-10-CM | POA: Diagnosis not present

## 2021-08-12 DIAGNOSIS — M9903 Segmental and somatic dysfunction of lumbar region: Secondary | ICD-10-CM | POA: Diagnosis not present

## 2021-08-12 DIAGNOSIS — M5416 Radiculopathy, lumbar region: Secondary | ICD-10-CM | POA: Diagnosis not present

## 2021-08-12 DIAGNOSIS — M5136 Other intervertebral disc degeneration, lumbar region: Secondary | ICD-10-CM | POA: Diagnosis not present

## 2021-08-12 DIAGNOSIS — M62830 Muscle spasm of back: Secondary | ICD-10-CM | POA: Diagnosis not present

## 2021-08-13 DIAGNOSIS — M5136 Other intervertebral disc degeneration, lumbar region: Secondary | ICD-10-CM | POA: Diagnosis not present

## 2021-08-13 DIAGNOSIS — M9903 Segmental and somatic dysfunction of lumbar region: Secondary | ICD-10-CM | POA: Diagnosis not present

## 2021-08-13 DIAGNOSIS — M62830 Muscle spasm of back: Secondary | ICD-10-CM | POA: Diagnosis not present

## 2021-08-13 DIAGNOSIS — M5416 Radiculopathy, lumbar region: Secondary | ICD-10-CM | POA: Diagnosis not present

## 2021-08-18 DIAGNOSIS — M62830 Muscle spasm of back: Secondary | ICD-10-CM | POA: Diagnosis not present

## 2021-08-18 DIAGNOSIS — M5416 Radiculopathy, lumbar region: Secondary | ICD-10-CM | POA: Diagnosis not present

## 2021-08-18 DIAGNOSIS — M5136 Other intervertebral disc degeneration, lumbar region: Secondary | ICD-10-CM | POA: Diagnosis not present

## 2021-08-18 DIAGNOSIS — M9903 Segmental and somatic dysfunction of lumbar region: Secondary | ICD-10-CM | POA: Diagnosis not present

## 2021-08-21 ENCOUNTER — Other Ambulatory Visit: Payer: BC Managed Care – PPO

## 2021-08-21 DIAGNOSIS — M62830 Muscle spasm of back: Secondary | ICD-10-CM | POA: Diagnosis not present

## 2021-08-21 DIAGNOSIS — M9903 Segmental and somatic dysfunction of lumbar region: Secondary | ICD-10-CM | POA: Diagnosis not present

## 2021-08-21 DIAGNOSIS — M5136 Other intervertebral disc degeneration, lumbar region: Secondary | ICD-10-CM | POA: Diagnosis not present

## 2021-08-21 DIAGNOSIS — M5416 Radiculopathy, lumbar region: Secondary | ICD-10-CM | POA: Diagnosis not present

## 2021-08-25 DIAGNOSIS — M5136 Other intervertebral disc degeneration, lumbar region: Secondary | ICD-10-CM | POA: Diagnosis not present

## 2021-08-25 DIAGNOSIS — M5416 Radiculopathy, lumbar region: Secondary | ICD-10-CM | POA: Diagnosis not present

## 2021-08-25 DIAGNOSIS — M9903 Segmental and somatic dysfunction of lumbar region: Secondary | ICD-10-CM | POA: Diagnosis not present

## 2021-08-25 DIAGNOSIS — M62830 Muscle spasm of back: Secondary | ICD-10-CM | POA: Diagnosis not present

## 2021-08-26 ENCOUNTER — Ambulatory Visit (INDEPENDENT_AMBULATORY_CARE_PROVIDER_SITE_OTHER): Payer: BC Managed Care – PPO | Admitting: Family Medicine

## 2021-08-26 ENCOUNTER — Other Ambulatory Visit: Payer: Self-pay | Admitting: Family Medicine

## 2021-08-26 ENCOUNTER — Encounter: Payer: Self-pay | Admitting: Family Medicine

## 2021-08-26 ENCOUNTER — Telehealth: Payer: Self-pay | Admitting: Family Medicine

## 2021-08-26 ENCOUNTER — Other Ambulatory Visit: Payer: Self-pay

## 2021-08-26 VITALS — BP 120/80 | HR 87 | Temp 98.1°F | Ht 63.0 in | Wt 153.4 lb

## 2021-08-26 DIAGNOSIS — E785 Hyperlipidemia, unspecified: Secondary | ICD-10-CM

## 2021-08-26 DIAGNOSIS — N951 Menopausal and female climacteric states: Secondary | ICD-10-CM

## 2021-08-26 DIAGNOSIS — Z780 Asymptomatic menopausal state: Secondary | ICD-10-CM | POA: Insufficient documentation

## 2021-08-26 DIAGNOSIS — Z0000 Encounter for general adult medical examination without abnormal findings: Secondary | ICD-10-CM

## 2021-08-26 DIAGNOSIS — R635 Abnormal weight gain: Secondary | ICD-10-CM | POA: Diagnosis not present

## 2021-08-26 LAB — IBC PANEL
Iron: 214 ug/dL — ABNORMAL HIGH (ref 42–145)
Saturation Ratios: 84.5 % — ABNORMAL HIGH (ref 20.0–50.0)
TIBC: 253.4 ug/dL (ref 250.0–450.0)
Transferrin: 181 mg/dL — ABNORMAL LOW (ref 212.0–360.0)

## 2021-08-26 LAB — COMPREHENSIVE METABOLIC PANEL
ALT: 13 U/L (ref 0–35)
AST: 16 U/L (ref 0–37)
Albumin: 4.3 g/dL (ref 3.5–5.2)
Alkaline Phosphatase: 53 U/L (ref 39–117)
BUN: 12 mg/dL (ref 6–23)
CO2: 29 mEq/L (ref 19–32)
Calcium: 9.3 mg/dL (ref 8.4–10.5)
Chloride: 103 mEq/L (ref 96–112)
Creatinine, Ser: 0.71 mg/dL (ref 0.40–1.20)
GFR: 100.7 mL/min (ref >60.00)
Glucose, Bld: 85 mg/dL (ref 70–99)
Potassium: 4.3 mEq/L (ref 3.5–5.1)
Sodium: 138 mEq/L (ref 135–145)
Total Bilirubin: 0.6 mg/dL (ref 0.2–1.2)
Total Protein: 6.1 g/dL (ref 6.0–8.3)

## 2021-08-26 LAB — LIPID PANEL
Cholesterol: 191 mg/dL (ref 0–200)
HDL: 74.1 mg/dL (ref >39.00)
LDL Cholesterol: 98 mg/dL (ref 0–99)
NonHDL: 117.01
Total CHOL/HDL Ratio: 3
Triglycerides: 93 mg/dL (ref 0.0–149.0)
VLDL: 18.6 mg/dL (ref 0.0–40.0)

## 2021-08-26 LAB — CBC WITH DIFFERENTIAL/PLATELET
Basophils Absolute: 0 10*3/uL (ref 0.0–0.1)
Basophils Relative: 0.8 % (ref 0.0–3.0)
Eosinophils Absolute: 0.1 10*3/uL (ref 0.0–0.7)
Eosinophils Relative: 2.8 % (ref 0.0–5.0)
HCT: 40.6 % (ref 36.0–46.0)
Hemoglobin: 13.7 g/dL (ref 12.0–15.0)
Lymphocytes Relative: 24.4 % (ref 12.0–46.0)
Lymphs Abs: 1.1 10*3/uL (ref 0.7–4.0)
MCHC: 33.7 g/dL (ref 30.0–36.0)
MCV: 94.4 fl (ref 78.0–100.0)
Monocytes Absolute: 0.4 10*3/uL (ref 0.1–1.0)
Monocytes Relative: 9 % (ref 3.0–12.0)
Neutro Abs: 2.8 10*3/uL (ref 1.4–7.7)
Neutrophils Relative %: 63 % (ref 43.0–77.0)
Platelets: 206 10*3/uL (ref 150.0–400.0)
RBC: 4.3 Mil/uL (ref 3.87–5.11)
RDW: 12.5 % (ref 11.5–15.5)
WBC: 4.5 10*3/uL (ref 4.0–10.5)

## 2021-08-26 LAB — TSH: TSH: 2.23 u[IU]/mL (ref 0.35–5.50)

## 2021-08-26 LAB — FOLLICLE STIMULATING HORMONE: FSH: 17.1 m[IU]/mL

## 2021-08-26 LAB — FERRITIN: Ferritin: 65.2 ng/mL (ref 10.0–291.0)

## 2021-08-26 NOTE — Assessment & Plan Note (Signed)
Preventative protocols reviewed and updated unless pt declined. Discussed healthy diet and lifestyle.  

## 2021-08-26 NOTE — Progress Notes (Signed)
Patient ID: Dana Salas, female    DOB: [birth year], 50 y.o.   MRN: [id number]  This visit was conducted in person.  BP 120/80   Pulse 87   Temp 98.1 F (36.7 C) (Temporal)   Ht 5\' 3"  (1.6 m)   Wt 153 lb 6 oz (69.6 kg)   LMP 06/04/2021   SpO2 98%   BMI 27.17 kg/m    CC: CPE Subjective:   HPI: Dana Salas is a 50 y.o. female presenting on 08/26/2021 for Annual Exam   ?perimenopausal - cycles irregular with increasing cramping and headaches and weight gain. Saw GYN - reassuring pelvic ultrasound. Last cycle was 80d ago.   Frontal HA associated with cycles along with some lightheadedness managed with ibuprofen 600mg  bid. No h/o migraines. Sister with h/o AVM. No cerebral aneurysms in the family.   She has started seeing chiropractor with benefit.   Preventative: COLONOSCOPY WITH PROPOFOL 04/15/2020 - TA, rpt 5 yrs (Tahiliani, Lennette Bihari, MD) Well woman with OBGYN Rubie Maid) last seen 07/2021. Last pap 09/2020.  Mammo - 10/2020 yearly, Bir[facility] @ [place] - h/o fibrocystic breast disease Flu - declines COVID vaccine - has decided to defer for now  Td 2005, 2011 - will defer for now  Seat belt use discussed  Sunscreen use discussed. No changing moles on skin. Sees derm.  Sleep - averages 8 hours/night Non smoker  Alcohol - occasional  Dentist - q6 mo Eye exam yearly   Lives with husband and 3 children (1 in college) Edu: bachelor's Occ: Ecologist  Activity: some online exercise programs, no regular exercise Diet: good water, fruits/vegetables daily     Relevant past medical, surgical, family and social history reviewed and updated as indicated. Interim medical history since our last visit reviewed. Allergies and medications reviewed and updated. Outpatient Medications Prior to Visit  Medication Sig Dispense Refill   cetirizine (ZYRTEC) 10 MG tablet Take 10 mg by mouth daily as needed for allergies.     fluticasone (FLONASE) 50  MCG/ACT nasal spray Place 1 spray into both nostrils daily.      ibuprofen (ADVIL) 200 MG tablet Take 3 tablets (600 mg total) by mouth 2 (two) times daily as needed for headache.     meloxicam (MOBIC) 15 MG tablet Take 1 tablet (15 mg total) by mouth daily. (Patient taking differently: Take 15 mg by mouth as needed.) 30 tablet 3   Multiple Vitamins-Minerals (EMERGEN-C IMMUNE) PACK Take 1 Package by mouth daily.     No facility-administered medications prior to visit.     Per HPI unless specifically indicated in ROS section below Review of Systems  Constitutional:  Negative for activity change, appetite change, chills, fatigue, fever and unexpected weight change.  HENT:  Negative for hearing loss.   Eyes:  Negative for visual disturbance.  Respiratory:  Negative for cough, chest tightness, shortness of breath and wheezing.   Cardiovascular:  Negative for chest pain, palpitations and leg swelling.  Gastrointestinal:  Negative for abdominal distention, abdominal pain, blood in stool, constipation, diarrhea, nausea and vomiting.  Genitourinary:  Negative for difficulty urinating and hematuria.  Musculoskeletal:  Negative for arthralgias, myalgias and neck pain.  Skin:  Negative for rash.  Neurological:  Negative for dizziness, seizures, syncope and headaches.  Hematological:  Negative for adenopathy. Does not bruise/bleed easily.  Psychiatric/Behavioral:  Negative for dysphoric mood. The patient is not nervous/anxious.    Objective:  BP 120/80   Pulse  87   Temp 98.1 F (36.7 C) (Temporal)   Ht 5\' 3"  (1.6 m)   Wt 153 lb 6 oz (69.6 kg)   LMP 06/04/2021   SpO2 98%   BMI 27.17 kg/m   Wt Readings from Last 3 Encounters:  08/26/21 153 lb 6 oz (69.6 kg)  08/06/21 155 lb (70.3 kg)  10/11/20 148 lb 6.4 oz (67.3 kg)      Physical Exam Vitals and nursing note reviewed.  Constitutional:      Appearance: Normal appearance. She is not ill-appearing.  HENT:     Head: Normocephalic and  atraumatic.     Right Ear: Tympanic membrane, ear canal and external ear normal. There is no impacted cerumen.     Left Ear: Tympanic membrane, ear canal and external ear normal. There is no impacted cerumen.  Eyes:     General:        Right eye: No discharge.        Left eye: No discharge.     Extraocular Movements: Extraocular movements intact.     Conjunctiva/sclera: Conjunctivae normal.     Pupils: Pupils are equal, round, and reactive to light.  Neck:     Thyroid: No thyroid mass or thyromegaly.  Cardiovascular:     Rate and Rhythm: Normal rate and regular rhythm.     Pulses: Normal pulses.     Heart sounds: Normal heart sounds. No murmur heard. Pulmonary:     Effort: Pulmonary effort is normal. No respiratory distress.     Breath sounds: Normal breath sounds. No wheezing, rhonchi or rales.  Abdominal:     General: Bowel sounds are normal. There is no distension.     Palpations: Abdomen is soft. There is no mass.     Tenderness: There is no abdominal tenderness. There is no guarding or rebound.     Hernia: No hernia is present.  Musculoskeletal:     Cervical back: Normal range of motion and neck supple. No rigidity.     Right lower leg: No edema.     Left lower leg: No edema.  Lymphadenopathy:     Cervical: No cervical adenopathy.  Skin:    General: Skin is warm and dry.     Findings: No rash.  Neurological:     General: No focal deficit present.     Mental Status: She is alert. Mental status is at baseline.  Psychiatric:        Mood and Affect: Mood normal.        Behavior: Behavior normal.      Results for orders placed or performed in visit on 10/11/20  Hepatic function panel  Result Value Ref Range   Total Protein 6.5 6.0 - 8.5 g/dL   Albumin 4.5 3.8 - 4.8 g/dL   Bilirubin Total 0.6 0.0 - 1.2 mg/dL   Bilirubin, Direct 0.16 0.00 - 0.40 mg/dL   Alkaline Phosphatase 52 44 - 121 IU/L   AST 18 0 - 40 IU/L   ALT 17 0 - 32 IU/L  Lipid panel  Result Value Ref  Range   Cholesterol, Total 191 100 - 199 mg/dL   Triglycerides 73 0 - 149 mg/dL   HDL 75 >39 mg/dL   VLDL Cholesterol Cal 13 5 - 40 mg/dL   LDL Chol Calc (NIH) 103 (H) 0 - 99 mg/dL   Chol/HDL Ratio 2.5 0.0 - 4.4 ratio  Cytology - PAP  Result Value Ref Range   High risk HPV Negative  Adequacy      Satisfactory for evaluation; transformation zone component PRESENT.   Diagnosis      - Negative for intraepithelial lesion or malignancy (NILM)   Comment Normal Reference Range HPV - Negative     Assessment & Plan:  This visit occurred during the SARS-CoV-2 public health emergency.  Safety protocols were in place, including screening questions prior to the visit, additional usage of staff PPE, and extensive cleaning of exam room while observing appropriate contact time as indicated for disinfecting solutions.   Problem List Items Addressed This Visit     Health maintenance examination - Primary (Chronic)    Preventative protocols reviewed and updated unless pt declined. Discussed healthy diet and lifestyle.       Iron excess    Update iron levels in h/o high iron.       Relevant Orders   CBC with Differential/Platelet   Hyperlipidemia    Mild, update FLP.       Relevant Orders   Lipid panel   Comprehensive metabolic panel   Perimenopause    Check FSH per pt request.  Noticing increasing perimenopausal symptoms.       Relevant Orders   CBC with Differential/Platelet   Other Visit Diagnoses     Weight gain            No orders of the defined types were placed in this encounter.  Orders Placed This Encounter  Procedures   Lipid panel   Comprehensive metabolic panel   CBC with Differential/Platelet     Patient instructions: You are doing well today Labs today  Good to see you today. Return as needed or in 1 year for next physical.   Follow up plan: Return in about 1 year (around 08/26/2022) for annual exam, prior fasting for blood work.  Ria Bush, MD

## 2021-08-26 NOTE — Telephone Encounter (Signed)
I don't see that anyone from our office tried to contact pt by phone.  However, Dr. Darnell Level sent pt lab results via [facility].

## 2021-08-26 NOTE — Assessment & Plan Note (Signed)
Check FSH per pt request.  Noticing increasing perimenopausal symptoms.

## 2021-08-26 NOTE — Assessment & Plan Note (Signed)
Update iron levels in h/o high iron.

## 2021-08-26 NOTE — Assessment & Plan Note (Signed)
Mild, update FLP.

## 2021-08-26 NOTE — Patient Instructions (Addendum)
You are doing well today Labs today  Good to see you today. Return as needed or in 1 year for next physical.   Health Maintenance, Female Adopting a healthy lifestyle and getting preventive care are important in promoting health and wellness. Ask your health care provider about: The right schedule for you to have regular tests and exams. Things you can do on your own to prevent diseases and keep yourself healthy. What should I know about diet, weight, and exercise? Eat a healthy diet  Eat a diet that includes plenty of vegetables, fruits, low-fat dairy products, and lean protein. Do not eat a lot of foods that are high in solid fats, added sugars, or sodium. Maintain a healthy weight Body mass index (BMI) is used to identify weight problems. It estimates body fat based on height and weight. Your health care provider can help determine your BMI and help you achieve or maintain a healthy weight. Get regular exercise Get regular exercise. This is one of the most important things you can do for your health. Most adults should: Exercise for at least 150 minutes each week. The exercise should increase your heart rate and make you sweat (moderate-intensity exercise). Do strengthening exercises at least twice a week. This is in addition to the moderate-intensity exercise. Spend less time sitting. Even light physical activity can be beneficial. Watch cholesterol and blood lipids Have your blood tested for lipids and cholesterol at 48 years of age, then have this test every 5 years. Have your cholesterol levels checked more often if: Your lipid or cholesterol levels are high. You are older than 48 years of age. You are at high risk for heart disease. What should I know about cancer screening? Depending on your health history and family history, you may need to have cancer screening at various ages. This may include screening for: Breast cancer. Cervical cancer. Colorectal cancer. Skin  cancer. Lung cancer. What should I know about heart disease, diabetes, and high blood pressure? Blood pressure and heart disease High blood pressure causes heart disease and increases the risk of stroke. This is more likely to develop in people who have high blood pressure readings, are of African descent, or are overweight. Have your blood pressure checked: Every 3-5 years if you are 68-9 years of age. Every year if you are 3 years old or older. Diabetes Have regular diabetes screenings. This checks your fasting blood sugar level. Have the screening done: Once every three years after age 28 if you are at a normal weight and have a low risk for diabetes. More often and at a younger age if you are overweight or have a high risk for diabetes. What should I know about preventing infection? Hepatitis B If you have a higher risk for hepatitis B, you should be screened for this virus. Talk with your health care provider to find out if you are at risk for hepatitis B infection. Hepatitis C Testing is recommended for: Everyone born from [birth year] through 1963. Anyone with known risk factors for hepatitis C. Sexually transmitted infections (STIs) Get screened for STIs, including gonorrhea and chlamydia, if: You are sexually active and are younger than 48 years of age. You are older than 48 years of age and your health care provider tells you that you are at risk for this type of infection. Your sexual activity has changed since you were last screened, and you are at increased risk for chlamydia or gonorrhea. Ask your health care provider if you are  at risk. Ask your health care provider about whether you are at high risk for HIV. Your health care provider may recommend a prescription medicine to help prevent HIV infection. If you choose to take medicine to prevent HIV, you should first get tested for HIV. You should then be tested every 3 months for as long as you are taking the medicine. Pregnancy If  you are about to stop having your period (premenopausal) and you may become pregnant, seek counseling before you get pregnant. Take 400 to 800 micrograms (mcg) of folic acid every day if you become pregnant. Ask for birth control (contraception) if you want to prevent pregnancy. Osteoporosis and menopause Osteoporosis is a disease in which the bones lose minerals and strength with aging. This can result in bone fractures. If you are 6 years old or older, or if you are at risk for osteoporosis and fractures, ask your health care provider if you should: Be screened for bone loss. Take a calcium or vitamin D supplement to lower your risk of fractures. Be given hormone replacement therapy (HRT) to treat symptoms of menopause. Follow these instructions at home: Lifestyle Do not use any products that contain nicotine or tobacco, such as cigarettes, e-cigarettes, and chewing tobacco. If you need help quitting, ask your health care provider. Do not use street drugs. Do not share needles. Ask your health care provider for help if you need support or information about quitting drugs. Alcohol use Do not drink alcohol if: Your health care provider tells you not to drink. You are pregnant, may be pregnant, or are planning to become pregnant. If you drink alcohol: Limit how much you use to 0-1 drink a day. Limit intake if you are breastfeeding. Be aware of how much alcohol is in your drink. In the [place] one drink equals one 12 oz bottle of beer (355 mL), one 5 oz glass of wine (148 mL), or one 1 oz glass of hard liquor (44 mL). General instructions Schedule regular health, dental, and eye exams. Stay current with your vaccines. Tell your health care provider if: You often feel depressed. You have ever been abused or do not feel safe at home. Summary Adopting a healthy lifestyle and getting preventive care are important in promoting health and wellness. Follow your health care provider's  instructions about healthy diet, exercising, and getting tested or screened for diseases. Follow your health care provider's instructions on monitoring your cholesterol and blood pressure. This information is not intended to replace advice given to you by your health care provider. Make sure you discuss any questions you have with your health care provider. Document Revised: 01/17/2021 Document Reviewed: 11/02/2018 Elsevier Patient Education  2022 [employer].

## 2021-08-26 NOTE — Telephone Encounter (Signed)
Pt is calling saying she got a call and is wanting a cb. Please advise at [phone].

## 2021-08-28 DIAGNOSIS — M9903 Segmental and somatic dysfunction of lumbar region: Secondary | ICD-10-CM | POA: Diagnosis not present

## 2021-08-28 DIAGNOSIS — M5136 Other intervertebral disc degeneration, lumbar region: Secondary | ICD-10-CM | POA: Diagnosis not present

## 2021-08-28 DIAGNOSIS — M5416 Radiculopathy, lumbar region: Secondary | ICD-10-CM | POA: Diagnosis not present

## 2021-08-28 DIAGNOSIS — M62830 Muscle spasm of back: Secondary | ICD-10-CM | POA: Diagnosis not present

## 2021-09-02 ENCOUNTER — Other Ambulatory Visit: Payer: Self-pay | Admitting: Family Medicine

## 2021-09-02 ENCOUNTER — Encounter: Payer: Self-pay | Admitting: Family Medicine

## 2021-09-02 HISTORY — DX: Hereditary hemochromatosis: E83.110

## 2021-09-02 LAB — HEMOCHROMATOSIS DNA-PCR(C282Y,H63D)

## 2021-09-02 NOTE — Telephone Encounter (Signed)
Replied via test result which returned this afternoon.

## 2021-09-03 DIAGNOSIS — M9903 Segmental and somatic dysfunction of lumbar region: Secondary | ICD-10-CM | POA: Diagnosis not present

## 2021-09-03 DIAGNOSIS — M5136 Other intervertebral disc degeneration, lumbar region: Secondary | ICD-10-CM | POA: Diagnosis not present

## 2021-09-03 DIAGNOSIS — M5416 Radiculopathy, lumbar region: Secondary | ICD-10-CM | POA: Diagnosis not present

## 2021-09-03 DIAGNOSIS — M62830 Muscle spasm of back: Secondary | ICD-10-CM | POA: Diagnosis not present

## 2021-09-04 ENCOUNTER — Ambulatory Visit (INDEPENDENT_AMBULATORY_CARE_PROVIDER_SITE_OTHER): Payer: BC Managed Care – PPO | Admitting: Dermatology

## 2021-09-04 ENCOUNTER — Other Ambulatory Visit: Payer: Self-pay

## 2021-09-04 ENCOUNTER — Encounter: Payer: Self-pay | Admitting: Family Medicine

## 2021-09-04 DIAGNOSIS — L821 Other seborrheic keratosis: Secondary | ICD-10-CM

## 2021-09-04 DIAGNOSIS — L820 Inflamed seborrheic keratosis: Secondary | ICD-10-CM | POA: Diagnosis not present

## 2021-09-04 NOTE — Progress Notes (Signed)
   Follow-Up Visit   Subjective  Dana Salas is a 50 y.o. female who presents for the following: Follow-up (ISK follow up of face, trunk treated with LN2).  The following portions of the chart were reviewed this encounter and updated as appropriate:   Tobacco  Allergies  Meds  Problems  Med Hx  Surg Hx  Fam Hx     Review of Systems:  No other skin or systemic complaints except as noted in HPI or Assessment and Plan.  Objective  Well appearing patient in no apparent distress; mood and affect are within normal limits.  A focused examination was performed including face, neck, chest and back. Relevant physical exam findings are noted in the Assessment and Plan.  Right temple x 1, back x 20, chest x ~20 (41) Erythematous keratotic or waxy stuck-on papule or plaque.    Assessment & Plan   Seborrheic Keratoses - Stuck-on, waxy, tan-brown papules and/or plaques  - Benign-appearing - Discussed benign etiology and prognosis. - Observe - Call for any changes  Inflamed seborrheic keratosis Right temple x 1, back x 20, chest x ~20  Destruction of lesion - Right temple x 1, back x 20, chest x ~20 Complexity: simple   Destruction method: cryotherapy   Informed consent: discussed and consent obtained   Timeout:  patient name, date of birth, surgical site, and procedure verified Lesion destroyed using liquid nitrogen: Yes   Region frozen until ice ball extended beyond lesion: Yes   Outcome: patient tolerated procedure well with no complications   Post-procedure details: wound care instructions given    Return in about 2 months (around 11/04/2021) for ISK follow up.  I, Ashok Cordia, CMA, am acting as scribe for Sarina Ser, MD .  Documentation: I have reviewed the above documentation for accuracy and completeness, and I agree with the above.  Sarina Ser, MD

## 2021-09-04 NOTE — Patient Instructions (Signed)

## 2021-09-08 ENCOUNTER — Encounter: Payer: Self-pay | Admitting: Dermatology

## 2021-09-11 ENCOUNTER — Inpatient Hospital Stay: Payer: BC Managed Care – PPO | Attending: Oncology | Admitting: Oncology

## 2021-09-11 ENCOUNTER — Inpatient Hospital Stay: Payer: BC Managed Care – PPO

## 2021-09-11 ENCOUNTER — Encounter: Payer: Self-pay | Admitting: Oncology

## 2021-09-11 ENCOUNTER — Other Ambulatory Visit: Payer: Self-pay

## 2021-09-11 DIAGNOSIS — R790 Abnormal level of blood mineral: Secondary | ICD-10-CM

## 2021-09-11 DIAGNOSIS — R7989 Other specified abnormal findings of blood chemistry: Secondary | ICD-10-CM | POA: Diagnosis not present

## 2021-09-11 DIAGNOSIS — Z79899 Other long term (current) drug therapy: Secondary | ICD-10-CM | POA: Insufficient documentation

## 2021-09-11 DIAGNOSIS — Z823 Family history of stroke: Secondary | ICD-10-CM | POA: Diagnosis not present

## 2021-09-11 DIAGNOSIS — Z8249 Family history of ischemic heart disease and other diseases of the circulatory system: Secondary | ICD-10-CM | POA: Diagnosis not present

## 2021-09-11 DIAGNOSIS — Z818 Family history of other mental and behavioral disorders: Secondary | ICD-10-CM | POA: Diagnosis not present

## 2021-09-11 DIAGNOSIS — Z803 Family history of malignant neoplasm of breast: Secondary | ICD-10-CM | POA: Diagnosis not present

## 2021-09-11 DIAGNOSIS — Z809 Family history of malignant neoplasm, unspecified: Secondary | ICD-10-CM | POA: Insufficient documentation

## 2021-09-11 DIAGNOSIS — R5383 Other fatigue: Secondary | ICD-10-CM | POA: Diagnosis not present

## 2021-09-11 DIAGNOSIS — M62830 Muscle spasm of back: Secondary | ICD-10-CM | POA: Diagnosis not present

## 2021-09-11 DIAGNOSIS — M2550 Pain in unspecified joint: Secondary | ICD-10-CM | POA: Insufficient documentation

## 2021-09-11 DIAGNOSIS — Z836 Family history of other diseases of the respiratory system: Secondary | ICD-10-CM | POA: Diagnosis not present

## 2021-09-11 DIAGNOSIS — M5416 Radiculopathy, lumbar region: Secondary | ICD-10-CM | POA: Diagnosis not present

## 2021-09-11 DIAGNOSIS — M5136 Other intervertebral disc degeneration, lumbar region: Secondary | ICD-10-CM | POA: Diagnosis not present

## 2021-09-11 DIAGNOSIS — M9903 Segmental and somatic dysfunction of lumbar region: Secondary | ICD-10-CM | POA: Diagnosis not present

## 2021-09-11 NOTE — Progress Notes (Signed)
Pt referred for hemochromatosis. Husband is present with pt today.

## 2021-09-11 NOTE — Progress Notes (Signed)
Hematology/Oncology Consult note [facility] Telephone:([phone] [phone]   Patient Care Team: Ria Bush, MD as PCP - General  REFERRING PROVIDER: Ria Bush, MD  CHIEF COMPLAINTS/REASON FOR VISIT:  Evaluation of hereditary hemochromatosis  HISTORY OF PRESENTING ILLNESS:  Dana Salas is a 50 y.o. female who was seen in consultation at the request of Ria Bush, MD for evaluation of  hereditary hemochromatosis .  Patient reports feeling fatigued recently and had blood work done by primary care provider . 09-24-2021, TIBC 253, iron saturation 84.5, ferritin 65.2.  Transferrin 181. CBC showed hemoglobin 13.7, normal WBC and platelet count. Hemochromatosis screening PCR showed C282Y/C282Y homozygous mutation. Patient also reports family history of sister was found to have elevated ferritin 600.  Sister and brothers hemochromatosis work-up is pending.  Both of her parents deceased. 09/24/2021, CMP showed normal AST, ALT, bilirubin. She drinks alcohol socially. Positive for arthralgia.  No unintentional weight loss, night sweats, fever.  Patient was accompanied by her husband.    Review of Systems  Constitutional:  Positive for fatigue. Negative for appetite change, chills and fever.  HENT:   Negative for hearing loss and voice change.   Eyes:  Negative for eye problems.  Respiratory:  Negative for chest tightness and cough.   Cardiovascular:  Negative for chest pain.  Gastrointestinal:  Negative for abdominal distention, abdominal pain and blood in stool.  Endocrine: Negative for hot flashes.  Genitourinary:  Negative for difficulty urinating and frequency.   Musculoskeletal:  Positive for arthralgias.  Skin:  Negative for itching and rash.  Neurological:  Negative for extremity weakness.  Hematological:  Negative for adenopathy.  Psychiatric/Behavioral:  Negative for confusion.    MEDICAL HISTORY:  Past Medical  History:  Diagnosis Date   Boil, labium 33/2016   Other abnormal blood chemistry    Iron,serum, elevated   Pneumonia 11/03   outpatient    SURGICAL HISTORY: Past Surgical History:  Procedure Laterality Date   COLONOSCOPY WITH PROPOFOL N/A 04/15/2020   TA, rpt 5 yrs (Tahiliani, Lennette Bihari, MD)    SOCIAL HISTORY: Social History   Socioeconomic History   Marital status: Married    Spouse name: Not on file   Number of children: 3   Years of education: Not on file   Highest education level: Not on file  Occupational History   Occupation: Programme researcher, broadcasting/film/video at [employer] Use   Smoking status: Never   Smokeless tobacco: Never  Vaping Use   Vaping Use: Never used  Substance and Sexual Activity   Alcohol use: Not Currently    Alcohol/week: 1.0 standard drink    Types: 1 Glasses of wine per week    Comment: occasionally   Drug use: No   Sexual activity: Yes    Birth control/protection: None, Surgical    Comment: vastcotomy  Other Topics Concern   Not on file  Social History Narrative   Not on file   Social Determinants of Health   Financial Resource Strain: Not on file  Food Insecurity: Not on file  Transportation Needs: Not on file  Physical Activity: Not on file  Stress: Not on file  Social Connections: Not on file  Intimate Partner Violence: Not on file    FAMILY HISTORY: Family History  Problem Relation Age of Onset   Hypertension Father    COPD Father        smoker   Heart attack Father    High Cholesterol Father  Stroke Father    Thyroid disease Father    Hypertension Sister    High Cholesterol Sister    AVM Sister        with bleed (at [facility])   Cancer Maternal Grandmother        unsure type   Breast cancer Paternal Grandmother    Dementia Mother    High Cholesterol Mother    Hypertension Brother    High Cholesterol Brother    Early death Paternal Grandfather 64   Heart attack Paternal Grandfather     ALLERGIES:  has No Known  Allergies.  MEDICATIONS:  Current Outpatient Medications  Medication Sig Dispense Refill   cetirizine (ZYRTEC) 10 MG tablet Take 10 mg by mouth daily as needed for allergies.     fluticasone (FLONASE) 50 MCG/ACT nasal spray Place 1 spray into both nostrils daily.      ibuprofen (ADVIL) 200 MG tablet Take 3 tablets (600 mg total) by mouth 2 (two) times daily as needed for headache.     meloxicam (MOBIC) 15 MG tablet Take 1 tablet (15 mg total) by mouth daily. (Patient not taking: Reported on 09/11/2021) 30 tablet 3   No current facility-administered medications for this visit.     PHYSICAL EXAMINATION: ECOG PERFORMANCE STATUS: 0 - Asymptomatic Vitals:   09/11/21 1012  BP: 123/85  Pulse: 78  Resp: 16  Temp: 97.8 F (36.6 C)  SpO2: 100%   Filed Weights   09/11/21 1012  Weight: 158 lb (71.7 kg)    Physical Exam Constitutional:      General: She is not in acute distress. HENT:     Head: Normocephalic and atraumatic.  Eyes:     General: No scleral icterus. Cardiovascular:     Rate and Rhythm: Normal rate and regular rhythm.     Heart sounds: Normal heart sounds.  Pulmonary:     Effort: Pulmonary effort is normal. No respiratory distress.     Breath sounds: No wheezing.  Abdominal:     General: Bowel sounds are normal. There is no distension.     Palpations: Abdomen is soft.  Musculoskeletal:        General: No deformity. Normal range of motion.     Cervical back: Normal range of motion and neck supple.  Skin:    General: Skin is warm and dry.     Findings: No erythema or rash.  Neurological:     Mental Status: She is alert and oriented to person, place, and time. Mental status is at baseline.     Cranial Nerves: No cranial nerve deficit.     Coordination: Coordination normal.  Psychiatric:        Mood and Affect: Mood normal.     LABORATORY DATA:  I have reviewed the data as listed Lab Results  Component Value Date   WBC 4.5 08/26/2021   HGB 13.7 08/26/2021    HCT 40.6 08/26/2021   MCV 94.4 08/26/2021   PLT 206.0 08/26/2021   Recent Labs    10/11/20 1105 08/26/21 0902  NA  --  138  K  --  4.3  CL  --  103  CO2  --  29  GLUCOSE  --  85  BUN  --  12  CREATININE  --  0.71  CALCIUM  --  9.3  PROT 6.5 6.1  ALBUMIN 4.5 4.3  AST 18 16  ALT 17 13  ALKPHOS 52 53  BILITOT 0.6 0.6  BILIDIR 0.16  --  Iron/TIBC/Ferritin/ %Sat    Component Value Date/Time   IRON 214 (H) 08/26/2021 0902   TIBC 253.4 08/26/2021 0902   FERRITIN 65.2 08/26/2021 0902   IRONPCTSAT 84.5 (H) 08/26/2021 0902      RADIOGRAPHIC STUDIES: I have personally reviewed the radiological images as listed and agreed with the findings in the report.  No results found.   ASSESSMENT & PLAN:  1. Hereditary hemochromatosis ([place])   2. Abnormal iron saturation    #Hereditary hemochromatosis,C282Y/C282Y homozygous. Iron labs reviewed and discussed with patient She has isolated increased iron saturation.  Ferritin is 65. Recommend weekly phlebotomy 500 cc x 2.  We will repeat iron panel in 6 weeks to see response. Patient's liver functions normal. Recommend patient to avoid alcohol, iron supplementation, vitamin C supplementation. Advised patient to have first-degree relative screening for hemochromatosis.  Patient voices understanding.   Obtain ultrasound right upper quadrant for evaluation of liver.   Orders Placed This Encounter  Procedures   US Abdomen Limited RUQ (LIVER/GB)    Standing Status:   Future    Standing Expiration Date:   09/11/2022    Order Specific Question:   Reason for Exam (SYMPTOM  OR DIAGNOSIS REQUIRED)    Answer:   Hemachromatosis    Order Specific Question:   Preferred imaging location?    Answer:   [facility]   CBC with Differential/Platelet    Standing Status:   Future    Standing Expiration Date:   09/11/2022   Comprehensive metabolic panel    Standing Status:   Future    Standing Expiration Date:   09/11/2022   Iron and  TIBC    Standing Status:   Future    Standing Expiration Date:   09/11/2022   Ferritin    Standing Status:   Future    Standing Expiration Date:   09/11/2022    All questions were answered. The patient knows to call the clinic with any problems questions or concerns.  Cc Ria Bush, MD  Return of visit: 6 weeks Thank you for this kind referral and the opportunity to participate in the care of this patient. A copy of today's note is routed to referring provider    Earlie Server, MD, PhD  09/11/2021

## 2021-09-17 ENCOUNTER — Other Ambulatory Visit: Payer: Self-pay

## 2021-09-17 ENCOUNTER — Ambulatory Visit
Admission: RE | Admit: 2021-09-17 | Discharge: 2021-09-17 | Disposition: A | Discharge status: Home / Self Care | Payer: BC Managed Care – PPO | Source: Ambulatory Visit | Attending: Oncology | Admitting: Oncology

## 2021-09-17 DIAGNOSIS — Z0389 Encounter for observation for other suspected diseases and conditions ruled out: Secondary | ICD-10-CM | POA: Diagnosis not present

## 2021-09-19 ENCOUNTER — Other Ambulatory Visit: Payer: Self-pay

## 2021-09-19 ENCOUNTER — Inpatient Hospital Stay: Payer: BC Managed Care – PPO

## 2021-09-19 DIAGNOSIS — Z803 Family history of malignant neoplasm of breast: Secondary | ICD-10-CM | POA: Diagnosis not present

## 2021-09-19 DIAGNOSIS — R5383 Other fatigue: Secondary | ICD-10-CM | POA: Diagnosis not present

## 2021-09-19 DIAGNOSIS — Z809 Family history of malignant neoplasm, unspecified: Secondary | ICD-10-CM | POA: Diagnosis not present

## 2021-09-19 DIAGNOSIS — Z836 Family history of other diseases of the respiratory system: Secondary | ICD-10-CM | POA: Diagnosis not present

## 2021-09-19 DIAGNOSIS — R7989 Other specified abnormal findings of blood chemistry: Secondary | ICD-10-CM | POA: Diagnosis not present

## 2021-09-19 DIAGNOSIS — Z8249 Family history of ischemic heart disease and other diseases of the circulatory system: Secondary | ICD-10-CM | POA: Diagnosis not present

## 2021-09-19 DIAGNOSIS — M2550 Pain in unspecified joint: Secondary | ICD-10-CM | POA: Diagnosis not present

## 2021-09-19 DIAGNOSIS — Z823 Family history of stroke: Secondary | ICD-10-CM | POA: Diagnosis not present

## 2021-09-19 DIAGNOSIS — Z79899 Other long term (current) drug therapy: Secondary | ICD-10-CM | POA: Diagnosis not present

## 2021-09-19 DIAGNOSIS — Z818 Family history of other mental and behavioral disorders: Secondary | ICD-10-CM | POA: Diagnosis not present

## 2021-09-19 MED ORDER — SODIUM CHLORIDE 0.9 % IV SOLN
Freq: Once | INTRAVENOUS | Status: AC
  Filled 2021-09-19: qty 250

## 2021-09-19 NOTE — Progress Notes (Signed)
Patient tolerated first phlebotomy well today. 500 cc removed as ordered due to elevated iron. 500cc IV NS replaced. Patient monitored 30 min post-phlebotomy.   No concerns voiced. Patient educated on phlebotomy aftercare. Discharged with husband, stable.

## 2021-09-19 NOTE — Patient Instructions (Signed)

## 2021-09-25 DIAGNOSIS — M62830 Muscle spasm of back: Secondary | ICD-10-CM | POA: Diagnosis not present

## 2021-09-25 DIAGNOSIS — M5136 Other intervertebral disc degeneration, lumbar region: Secondary | ICD-10-CM | POA: Diagnosis not present

## 2021-09-25 DIAGNOSIS — M5416 Radiculopathy, lumbar region: Secondary | ICD-10-CM | POA: Diagnosis not present

## 2021-09-25 DIAGNOSIS — M9903 Segmental and somatic dysfunction of lumbar region: Secondary | ICD-10-CM | POA: Diagnosis not present

## 2021-09-26 ENCOUNTER — Inpatient Hospital Stay: Payer: BC Managed Care – PPO | Attending: Oncology

## 2021-09-26 ENCOUNTER — Other Ambulatory Visit: Payer: Self-pay

## 2021-09-26 NOTE — Progress Notes (Signed)
Removed 500 ml of blood per MD parameters for Therapeutic Phlebotomy. Patient tolerated well. Accepted a beverage. VSS. Denies any discomfort. Discharged to home. Accompanied by her husband.

## 2021-09-26 NOTE — Patient Instructions (Signed)

## 2021-10-21 ENCOUNTER — Inpatient Hospital Stay: Payer: BC Managed Care – PPO

## 2021-10-21 ENCOUNTER — Other Ambulatory Visit: Payer: Self-pay

## 2021-10-21 LAB — COMPREHENSIVE METABOLIC PANEL
ALT: 17 U/L (ref 0–44)
AST: 23 U/L (ref 15–41)
Albumin: 5 g/dL (ref 3.5–5.0)
Alkaline Phosphatase: 58 U/L (ref 38–126)
Anion gap: 7 (ref 5–15)
BUN: 15 mg/dL (ref 6–20)
CO2: 24 mmol/L (ref 22–32)
Calcium: 8.8 mg/dL — ABNORMAL LOW (ref 8.9–10.3)
Chloride: 105 mmol/L (ref 98–111)
Creatinine, Ser: 0.82 mg/dL (ref 0.44–1.00)
GFR, Estimated: >60 mL/min (ref >60)
Glucose, Bld: 90 mg/dL (ref 70–99)
Potassium: 4.1 mmol/L (ref 3.5–5.1)
Sodium: 136 mmol/L (ref 135–145)
Total Bilirubin: 0.6 mg/dL (ref 0.3–1.2)
Total Protein: 7 g/dL (ref 6.5–8.1)

## 2021-10-21 LAB — CBC WITH DIFFERENTIAL/PLATELET
Abs Immature Granulocytes: 0.01 10*3/uL (ref 0.00–0.07)
Basophils Absolute: 0 10*3/uL (ref 0.0–0.1)
Basophils Relative: 1 %
Eosinophils Absolute: 0.1 10*3/uL (ref 0.0–0.5)
Eosinophils Relative: 4 %
HCT: 39.6 % (ref 36.0–46.0)
Hemoglobin: 13.3 g/dL (ref 12.0–15.0)
Immature Granulocytes: 0 %
Lymphocytes Relative: 28 %
Lymphs Abs: 1.1 10*3/uL (ref 0.7–4.0)
MCH: 32.8 pg (ref 26.0–34.0)
MCHC: 33.6 g/dL (ref 30.0–36.0)
MCV: 97.8 fL (ref 80.0–100.0)
Monocytes Absolute: 0.4 10*3/uL (ref 0.1–1.0)
Monocytes Relative: 10 %
Neutro Abs: 2.2 10*3/uL (ref 1.7–7.7)
Neutrophils Relative %: 57 %
Platelets: 212 10*3/uL (ref 150–400)
RBC: 4.05 MIL/uL (ref 3.87–5.11)
RDW: 12.3 % (ref 11.5–15.5)
WBC: 3.9 10*3/uL — ABNORMAL LOW (ref 4.0–10.5)
nRBC: 0 % (ref 0.0–0.2)

## 2021-10-21 LAB — IRON AND TIBC
Iron: 112 ug/dL (ref 28–170)
Saturation Ratios: 37 % — ABNORMAL HIGH (ref 10.4–31.8)
TIBC: 305 ug/dL (ref 250–450)
UIBC: 193 ug/dL

## 2021-10-21 LAB — FERRITIN: Ferritin: 18 ng/mL (ref 11–307)

## 2021-10-23 ENCOUNTER — Inpatient Hospital Stay: Payer: BC Managed Care – PPO | Attending: Oncology | Admitting: Oncology

## 2021-10-23 ENCOUNTER — Encounter: Payer: Self-pay | Admitting: Oncology

## 2021-10-23 ENCOUNTER — Other Ambulatory Visit: Payer: Self-pay

## 2021-10-23 ENCOUNTER — Inpatient Hospital Stay: Payer: BC Managed Care – PPO | Attending: Oncology

## 2021-10-23 ENCOUNTER — Inpatient Hospital Stay: Payer: BC Managed Care – PPO

## 2021-10-23 DIAGNOSIS — Z79899 Other long term (current) drug therapy: Secondary | ICD-10-CM | POA: Diagnosis not present

## 2021-10-23 DIAGNOSIS — Z818 Family history of other mental and behavioral disorders: Secondary | ICD-10-CM | POA: Diagnosis not present

## 2021-10-23 DIAGNOSIS — R7989 Other specified abnormal findings of blood chemistry: Secondary | ICD-10-CM | POA: Diagnosis not present

## 2021-10-23 DIAGNOSIS — Z803 Family history of malignant neoplasm of breast: Secondary | ICD-10-CM | POA: Insufficient documentation

## 2021-10-23 DIAGNOSIS — M62830 Muscle spasm of back: Secondary | ICD-10-CM | POA: Diagnosis not present

## 2021-10-23 DIAGNOSIS — M9903 Segmental and somatic dysfunction of lumbar region: Secondary | ICD-10-CM | POA: Diagnosis not present

## 2021-10-23 DIAGNOSIS — Z8349 Family history of other endocrine, nutritional and metabolic diseases: Secondary | ICD-10-CM | POA: Diagnosis not present

## 2021-10-23 DIAGNOSIS — M2550 Pain in unspecified joint: Secondary | ICD-10-CM | POA: Insufficient documentation

## 2021-10-23 DIAGNOSIS — Z8249 Family history of ischemic heart disease and other diseases of the circulatory system: Secondary | ICD-10-CM | POA: Diagnosis not present

## 2021-10-23 DIAGNOSIS — R5383 Other fatigue: Secondary | ICD-10-CM | POA: Insufficient documentation

## 2021-10-23 DIAGNOSIS — Z809 Family history of malignant neoplasm, unspecified: Secondary | ICD-10-CM | POA: Diagnosis not present

## 2021-10-23 DIAGNOSIS — Z836 Family history of other diseases of the respiratory system: Secondary | ICD-10-CM | POA: Insufficient documentation

## 2021-10-23 DIAGNOSIS — M5416 Radiculopathy, lumbar region: Secondary | ICD-10-CM | POA: Diagnosis not present

## 2021-10-23 DIAGNOSIS — M5136 Other intervertebral disc degeneration, lumbar region: Secondary | ICD-10-CM | POA: Diagnosis not present

## 2021-10-23 NOTE — Progress Notes (Signed)
Per Dr. Tasia Catchings, no phlebotomy needed today.

## 2021-10-23 NOTE — Progress Notes (Signed)
Hematology/Oncology progress note    Patient Care Team: Ria Bush, MD as PCP - General  REFERRING PROVIDER: Ria Bush, MD  CHIEF COMPLAINTS/REASON FOR VISIT:  hereditary hemochromatosis  HISTORY OF PRESENTING ILLNESS:  Dana Salas is a 50 y.o. female who was seen in consultation at the request of Ria Bush, MD for evaluation of  hereditary hemochromatosis .  Patient reports feeling fatigued recently and had blood work done by primary care provider . 09/06/2021, TIBC 253, iron saturation 84.5, ferritin 65.2.  Transferrin 181. CBC showed hemoglobin 13.7, normal WBC and platelet count. Hemochromatosis screening PCR showed C282Y/C282Y homozygous mutation. Patient also reports family history of sister was found to have elevated ferritin 600.  Sister and brothers hemochromatosis work-up is pending.  Both of her parents deceased. 2021/09/06, CMP showed normal AST, ALT, bilirubin. She drinks alcohol socially. Positive for arthralgia.  No unintentional weight loss, night sweats, fever.  Patient was accompanied by her husband.  INTERVAL HISTORY Dana Salas is a 50 y.o. female who has above history reviewed by me today presents for follow up visit for homozygous C282Y hemochromatosis Patient has had phlebotomy.  She reports feeling well.  No new complaints.  Review of Systems  Constitutional:  Positive for fatigue. Negative for appetite change, chills and fever.  HENT:   Negative for hearing loss and voice change.   Eyes:  Negative for eye problems.  Respiratory:  Negative for chest tightness and cough.   Cardiovascular:  Negative for chest pain.  Gastrointestinal:  Negative for abdominal distention, abdominal pain and blood in stool.  Endocrine: Negative for hot flashes.  Genitourinary:  Negative for difficulty urinating and frequency.   Musculoskeletal:  Positive for arthralgias.  Skin:  Negative for itching and rash.  Neurological:  Negative for  extremity weakness.  Hematological:  Negative for adenopathy.  Psychiatric/Behavioral:  Negative for confusion.    MEDICAL HISTORY:  Past Medical History:  Diagnosis Date   Boil, labium 33/2016   Other abnormal blood chemistry    Iron,serum, elevated   Pneumonia 11/03   outpatient    SURGICAL HISTORY: Past Surgical History:  Procedure Laterality Date   COLONOSCOPY WITH PROPOFOL N/A 04/15/2020   TA, rpt 5 yrs (Tahiliani, Lennette Bihari, MD)    SOCIAL HISTORY: Social History   Socioeconomic History   Marital status: Married    Spouse name: Not on file   Number of children: 3   Years of education: Not on file   Highest education level: Not on file  Occupational History   Occupation: Programme researcher, 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   Smoking status: Never   Smokeless tobacco: Never  Vaping Use   Vaping Use: Never used  Substance and Sexual Activity   Alcohol use: Not Currently    Alcohol/week: 1.0 standard drink    Types: 1 Glasses of wine per week    Comment: occasionally   Drug use: No   Sexual activity: Yes    Birth control/protection: None, Surgical    Comment: vastcotomy  Other Topics Concern   Not on file  Social History Narrative   Not on file   Social Determinants of Health   Financial Resource Strain: Not on file  Food Insecurity: Not on file  Transportation Needs: Not on file  Physical Activity: Not on file  Stress: Not on file  Social Connections: Not on file  Intimate Partner Violence: Not on file    FAMILY HISTORY: Family History  Problem Relation Age of Onset  Hypertension Father    COPD Father        smoker   Heart attack Father    High Cholesterol Father    Stroke Father    Thyroid disease Father    Hypertension Sister    High Cholesterol Sister    AVM Sister        with bleed (at [facility])   Cancer Maternal Grandmother        unsure type   Breast cancer Paternal Grandmother    Dementia Mother    High Cholesterol Mother     Hypertension Brother    High Cholesterol Brother    Early death Paternal Grandfather 57   Heart attack Paternal Grandfather     ALLERGIES:  has No Known Allergies.  MEDICATIONS:  Current Outpatient Medications  Medication Sig Dispense Refill   cetirizine (ZYRTEC) 10 MG tablet Take 10 mg by mouth daily as needed for allergies.     fluticasone (FLONASE) 50 MCG/ACT nasal spray Place 1 spray into both nostrils daily.      ibuprofen (ADVIL) 200 MG tablet Take 3 tablets (600 mg total) by mouth 2 (two) times daily as needed for headache.     meloxicam (MOBIC) 15 MG tablet Take 1 tablet (15 mg total) by mouth daily. (Patient not taking: Reported on 09/11/2021) 30 tablet 3   No current facility-administered medications for this visit.     PHYSICAL EXAMINATION: ECOG PERFORMANCE STATUS: 0 - Asymptomatic Vitals:   10/23/21 1418  BP: 135/85  Pulse: 76  Temp: (!) 97.5 F (36.4 C)   Filed Weights   10/23/21 1418  Weight: 155 lb (70.3 kg)    Physical Exam Constitutional:      General: She is not in acute distress. HENT:     Head: Normocephalic and atraumatic.  Eyes:     General: No scleral icterus. Cardiovascular:     Rate and Rhythm: Normal rate and regular rhythm.     Heart sounds: Normal heart sounds.  Pulmonary:     Effort: Pulmonary effort is normal. No respiratory distress.     Breath sounds: No wheezing.  Abdominal:     General: Bowel sounds are normal. There is no distension.     Palpations: Abdomen is soft.  Musculoskeletal:        General: No deformity. Normal range of motion.     Cervical back: Normal range of motion and neck supple.  Skin:    General: Skin is warm and dry.     Findings: No erythema or rash.  Neurological:     Mental Status: She is alert and oriented to person, place, and time. Mental status is at baseline.     Cranial Nerves: No cranial nerve deficit.     Coordination: Coordination normal.  Psychiatric:        Mood and Affect: Mood normal.      LABORATORY DATA:  I have reviewed the data as listed Lab Results  Component Value Date   WBC 3.9 (L) 10/21/2021   HGB 13.3 10/21/2021   HCT 39.6 10/21/2021   MCV 97.8 10/21/2021   PLT 212 10/21/2021   Recent Labs    08/26/21 0902 10/21/21 0802  NA 138 136  K 4.3 4.1  CL 103 105  CO2 29 24  GLUCOSE 85 90  BUN 12 15  CREATININE 0.71 0.82  CALCIUM 9.3 8.8*  GFRNONAA  --  >60  PROT 6.1 7.0  ALBUMIN 4.3 5.0  AST 16 23  ALT  13 17  ALKPHOS 53 58  BILITOT 0.6 0.6    Iron/TIBC/Ferritin/ %Sat    Component Value Date/Time   IRON 112 10/21/2021 0802   TIBC 305 10/21/2021 0802   FERRITIN 18 10/21/2021 0802   IRONPCTSAT 37 (H) 10/21/2021 0802       RADIOGRAPHIC STUDIES: I have personally reviewed the radiological images as listed and agreed with the findings in the report.  No results found.   ASSESSMENT & PLAN:  1. Hemochromatosis, hereditary ([place])    #Hereditary hemochromatosis,C282Y/C282Y homozygous. Labs reviewed and discussed with patient. Iron saturation has decreased to 37.  Ferritin 18. Hold phlebotomy. Ferritin is at goal.  Less than 50.  Discussed with patient about intermittent phlebotomy to keep ferritin at goal.  Blood donation is an alternative. She may consider blood donation in 3 months.  Follow-up in 6 months for repeat labs and a follow-up.  Orders Placed This Encounter  Procedures   CBC with Differential/Platelet    Standing Status:   Future    Standing Expiration Date:   10/23/2022   Comprehensive metabolic panel    Standing Status:   Future    Standing Expiration Date:   10/23/2022   Iron and TIBC    Standing Status:   Future    Standing Expiration Date:   10/23/2022   Vitamin B12    Standing Status:   Future    Standing Expiration Date:   10/23/2022    All questions were answered. The patient knows to call the clinic with any problems questions or concerns.  Cc Ria Bush, MD   Earlie Server, MD, PhD  10/23/2021

## 2021-11-06 ENCOUNTER — Other Ambulatory Visit: Payer: Self-pay

## 2021-11-06 ENCOUNTER — Ambulatory Visit (INDEPENDENT_AMBULATORY_CARE_PROVIDER_SITE_OTHER): Payer: BC Managed Care – PPO | Admitting: Dermatology

## 2021-11-06 ENCOUNTER — Encounter: Payer: Self-pay | Admitting: Dermatology

## 2021-11-06 DIAGNOSIS — L820 Inflamed seborrheic keratosis: Secondary | ICD-10-CM | POA: Diagnosis not present

## 2021-11-06 DIAGNOSIS — L814 Other melanin hyperpigmentation: Secondary | ICD-10-CM

## 2021-11-06 DIAGNOSIS — M62830 Muscle spasm of back: Secondary | ICD-10-CM | POA: Diagnosis not present

## 2021-11-06 DIAGNOSIS — M9903 Segmental and somatic dysfunction of lumbar region: Secondary | ICD-10-CM | POA: Diagnosis not present

## 2021-11-06 DIAGNOSIS — L821 Other seborrheic keratosis: Secondary | ICD-10-CM | POA: Diagnosis not present

## 2021-11-06 DIAGNOSIS — M5136 Other intervertebral disc degeneration, lumbar region: Secondary | ICD-10-CM | POA: Diagnosis not present

## 2021-11-06 DIAGNOSIS — M5416 Radiculopathy, lumbar region: Secondary | ICD-10-CM | POA: Diagnosis not present

## 2021-11-06 NOTE — Progress Notes (Signed)
° °  Follow-Up Visit   Subjective  Dana Salas is a 50 y.o. female who presents for the following: ISK's  (Chest x 20, back x 20, R temple x 1 - patient has noticed more irritated skin lesions on the chest in particular.).   The following portions of the chart were reviewed this encounter and updated as appropriate:   Tobacco   Allergies   Meds   Problems   Med Hx   Surg Hx   Fam Hx       Review of Systems:  No other skin or systemic complaints except as noted in HPI or Assessment and Plan.  Objective  Well appearing patient in no apparent distress; mood and affect are within normal limits.  A focused examination was performed including the back, chest, face, and abdomen. Relevant physical exam findings are noted in the Assessment and Plan.  L inframammary x 6, mid chest x 15, R inframammary x 5, R upper abdomen x 1, neck x 20, L shoulder x 1 (50) Subcutaneous nodule.    Assessment & Plan  Inflamed seborrheic keratosis (50) L inframammary x 6, mid chest x 15, R inframammary x 5, R upper abdomen x 1, neck x 20, L shoulder x 1  Itchy and symptomatic, some rubbing at bra Recommend [place] anti itch cream PRN itch.   Destruction of lesion - L inframammary x 6, mid chest x 15, R inframammary x 5, R upper abdomen x 1, neck x 20, L shoulder x 1 Complexity: simple   Destruction method: cryotherapy   Informed consent: discussed and consent obtained   Timeout:  patient name, date of birth, surgical site, and procedure verified Lesion destroyed using liquid nitrogen: Yes   Region frozen until ice ball extended beyond lesion: Yes   Outcome: patient tolerated procedure well with no complications   Post-procedure details: wound care instructions given    Seborrheic Keratoses - Stuck-on, waxy, tan-brown papules and/or plaques  - Benign-appearing - Discussed benign etiology and prognosis. - Observe - Call for any changes  Lentigines - Scattered tan macules - Due to sun  exposure - Benign-appering, observe - Recommend daily broad spectrum sunscreen SPF 30+ to sun-exposed areas, reapply every 2 hours as needed. - Call for any changes  Return in about 3 months (around 02/04/2022), or if symptoms worsen or fail to improve.  Luther Redo, CMA, am acting as scribe for Forest Gleason, MD .  Documentation: I have reviewed the above documentation for accuracy and completeness, and I agree with the above.  Forest Gleason, MD

## 2021-11-06 NOTE — Patient Instructions (Signed)

## 2021-11-12 ENCOUNTER — Ambulatory Visit
Admission: RE | Admit: 2021-11-12 | Discharge: 2021-11-12 | Disposition: A | Discharge status: Home / Self Care | Payer: BC Managed Care – PPO | Source: Ambulatory Visit | Attending: Obstetrics and Gynecology | Admitting: Obstetrics and Gynecology

## 2021-11-12 ENCOUNTER — Other Ambulatory Visit: Payer: Self-pay

## 2021-11-12 DIAGNOSIS — Z1231 Encounter for screening mammogram for malignant neoplasm of breast: Secondary | ICD-10-CM | POA: Diagnosis not present

## 2021-11-27 DIAGNOSIS — M9903 Segmental and somatic dysfunction of lumbar region: Secondary | ICD-10-CM | POA: Diagnosis not present

## 2021-11-27 DIAGNOSIS — M62830 Muscle spasm of back: Secondary | ICD-10-CM | POA: Diagnosis not present

## 2021-11-27 DIAGNOSIS — M5136 Other intervertebral disc degeneration, lumbar region: Secondary | ICD-10-CM | POA: Diagnosis not present

## 2021-11-27 DIAGNOSIS — M5416 Radiculopathy, lumbar region: Secondary | ICD-10-CM | POA: Diagnosis not present

## 2021-12-30 DIAGNOSIS — M5416 Radiculopathy, lumbar region: Secondary | ICD-10-CM | POA: Diagnosis not present

## 2021-12-30 DIAGNOSIS — M9903 Segmental and somatic dysfunction of lumbar region: Secondary | ICD-10-CM | POA: Diagnosis not present

## 2021-12-30 DIAGNOSIS — M62830 Muscle spasm of back: Secondary | ICD-10-CM | POA: Diagnosis not present

## 2021-12-30 DIAGNOSIS — M5136 Other intervertebral disc degeneration, lumbar region: Secondary | ICD-10-CM | POA: Diagnosis not present

## 2022-01-27 DIAGNOSIS — M5416 Radiculopathy, lumbar region: Secondary | ICD-10-CM | POA: Diagnosis not present

## 2022-01-27 DIAGNOSIS — M5136 Other intervertebral disc degeneration, lumbar region: Secondary | ICD-10-CM | POA: Diagnosis not present

## 2022-01-27 DIAGNOSIS — M9903 Segmental and somatic dysfunction of lumbar region: Secondary | ICD-10-CM | POA: Diagnosis not present

## 2022-01-27 DIAGNOSIS — M62830 Muscle spasm of back: Secondary | ICD-10-CM | POA: Diagnosis not present

## 2022-02-26 DIAGNOSIS — M62830 Muscle spasm of back: Secondary | ICD-10-CM | POA: Diagnosis not present

## 2022-02-26 DIAGNOSIS — M5136 Other intervertebral disc degeneration, lumbar region: Secondary | ICD-10-CM | POA: Diagnosis not present

## 2022-02-26 DIAGNOSIS — M9903 Segmental and somatic dysfunction of lumbar region: Secondary | ICD-10-CM | POA: Diagnosis not present

## 2022-02-26 DIAGNOSIS — M5416 Radiculopathy, lumbar region: Secondary | ICD-10-CM | POA: Diagnosis not present

## 2022-03-26 DIAGNOSIS — M5416 Radiculopathy, lumbar region: Secondary | ICD-10-CM | POA: Diagnosis not present

## 2022-03-26 DIAGNOSIS — M62830 Muscle spasm of back: Secondary | ICD-10-CM | POA: Diagnosis not present

## 2022-03-26 DIAGNOSIS — M9903 Segmental and somatic dysfunction of lumbar region: Secondary | ICD-10-CM | POA: Diagnosis not present

## 2022-03-26 DIAGNOSIS — M5136 Other intervertebral disc degeneration, lumbar region: Secondary | ICD-10-CM | POA: Diagnosis not present

## 2022-04-22 ENCOUNTER — Inpatient Hospital Stay: Payer: BC Managed Care – PPO | Attending: Oncology

## 2022-04-22 LAB — CBC WITH DIFFERENTIAL/PLATELET
Abs Immature Granulocytes: 0.02 10*3/uL (ref 0.00–0.07)
Basophils Absolute: 0 10*3/uL (ref 0.0–0.1)
Basophils Relative: 1 %
Eosinophils Absolute: 0.1 10*3/uL (ref 0.0–0.5)
Eosinophils Relative: 2 %
HCT: 40.8 % (ref 36.0–46.0)
Hemoglobin: 13.9 g/dL (ref 12.0–15.0)
Immature Granulocytes: 0 %
Lymphocytes Relative: 20 %
Lymphs Abs: 1.2 10*3/uL (ref 0.7–4.0)
MCH: 32.3 pg (ref 26.0–34.0)
MCHC: 34.1 g/dL (ref 30.0–36.0)
MCV: 94.7 fL (ref 80.0–100.0)
Monocytes Absolute: 0.4 10*3/uL (ref 0.1–1.0)
Monocytes Relative: 7 %
Neutro Abs: 4.2 10*3/uL (ref 1.7–7.7)
Neutrophils Relative %: 70 %
Platelets: 220 10*3/uL (ref 150–400)
RBC: 4.31 MIL/uL (ref 3.87–5.11)
RDW: 11.7 % (ref 11.5–15.5)
WBC: 5.9 10*3/uL (ref 4.0–10.5)
nRBC: 0 % (ref 0.0–0.2)

## 2022-04-22 LAB — COMPREHENSIVE METABOLIC PANEL
ALT: 14 U/L (ref 0–44)
AST: 19 U/L (ref 15–41)
Albumin: 4.3 g/dL (ref 3.5–5.0)
Alkaline Phosphatase: 57 U/L (ref 38–126)
Anion gap: 7 (ref 5–15)
BUN: 12 mg/dL (ref 6–20)
CO2: 27 mmol/L (ref 22–32)
Calcium: 9.1 mg/dL (ref 8.9–10.3)
Chloride: 102 mmol/L (ref 98–111)
Creatinine, Ser: 0.79 mg/dL (ref 0.44–1.00)
GFR, Estimated: >60 mL/min (ref >60)
Glucose, Bld: 94 mg/dL (ref 70–99)
Potassium: 4.1 mmol/L (ref 3.5–5.1)
Sodium: 136 mmol/L (ref 135–145)
Total Bilirubin: 0.3 mg/dL (ref 0.3–1.2)
Total Protein: 7 g/dL (ref 6.5–8.1)

## 2022-04-22 LAB — IRON AND TIBC
Iron: 168 ug/dL (ref 28–170)
Saturation Ratios: 57 % — ABNORMAL HIGH (ref 10.4–31.8)
TIBC: 295 ug/dL (ref 250–450)
UIBC: 127 ug/dL

## 2022-04-22 LAB — FERRITIN: Ferritin: 18 ng/mL (ref 11–307)

## 2022-04-22 LAB — VITAMIN B12: Vitamin B-12: 274 pg/mL (ref 180–914)

## 2022-04-23 ENCOUNTER — Inpatient Hospital Stay: Payer: BC Managed Care – PPO | Attending: Oncology | Admitting: Oncology

## 2022-04-23 ENCOUNTER — Encounter: Payer: Self-pay | Admitting: Oncology

## 2022-04-23 DIAGNOSIS — Z8349 Family history of other endocrine, nutritional and metabolic diseases: Secondary | ICD-10-CM | POA: Diagnosis not present

## 2022-04-23 DIAGNOSIS — Z8249 Family history of ischemic heart disease and other diseases of the circulatory system: Secondary | ICD-10-CM | POA: Diagnosis not present

## 2022-04-23 DIAGNOSIS — Z823 Family history of stroke: Secondary | ICD-10-CM | POA: Insufficient documentation

## 2022-04-23 DIAGNOSIS — Z818 Family history of other mental and behavioral disorders: Secondary | ICD-10-CM | POA: Diagnosis not present

## 2022-04-23 DIAGNOSIS — Z79899 Other long term (current) drug therapy: Secondary | ICD-10-CM | POA: Diagnosis not present

## 2022-04-23 DIAGNOSIS — R5383 Other fatigue: Secondary | ICD-10-CM | POA: Insufficient documentation

## 2022-04-23 DIAGNOSIS — Z836 Family history of other diseases of the respiratory system: Secondary | ICD-10-CM | POA: Insufficient documentation

## 2022-04-23 DIAGNOSIS — M2550 Pain in unspecified joint: Secondary | ICD-10-CM | POA: Diagnosis not present

## 2022-04-23 DIAGNOSIS — R7989 Other specified abnormal findings of blood chemistry: Secondary | ICD-10-CM | POA: Insufficient documentation

## 2022-04-23 DIAGNOSIS — Z803 Family history of malignant neoplasm of breast: Secondary | ICD-10-CM | POA: Insufficient documentation

## 2022-04-23 DIAGNOSIS — Z809 Family history of malignant neoplasm, unspecified: Secondary | ICD-10-CM | POA: Insufficient documentation

## 2022-04-23 DIAGNOSIS — M9903 Segmental and somatic dysfunction of lumbar region: Secondary | ICD-10-CM | POA: Diagnosis not present

## 2022-04-23 DIAGNOSIS — M5416 Radiculopathy, lumbar region: Secondary | ICD-10-CM | POA: Diagnosis not present

## 2022-04-23 DIAGNOSIS — M5136 Other intervertebral disc degeneration, lumbar region: Secondary | ICD-10-CM | POA: Diagnosis not present

## 2022-04-23 DIAGNOSIS — R790 Abnormal level of blood mineral: Secondary | ICD-10-CM | POA: Diagnosis not present

## 2022-04-23 DIAGNOSIS — M62830 Muscle spasm of back: Secondary | ICD-10-CM | POA: Diagnosis not present

## 2022-04-23 NOTE — Progress Notes (Signed)
Pt here for follow up. No new concerns voiced.   

## 2022-04-23 NOTE — Progress Notes (Signed)
Hematology/Oncology progress note    Patient Care Team: Ria Bush, MD as PCP - General  REFERRING PROVIDER: Ria Bush, MD  CHIEF COMPLAINTS/REASON FOR VISIT:  hereditary hemochromatosis  HISTORY OF PRESENTING ILLNESS:  Dana Salas is a 51 y.o. female who was seen in consultation at the request of Ria Bush, MD for evaluation of  hereditary hemochromatosis .  Patient reports feeling fatigued recently and had blood work done by primary care provider . 09-03-2021, TIBC 253, iron saturation 84.5, ferritin 65.2.  Transferrin 181. CBC showed hemoglobin 13.7, normal WBC and platelet count. Hemochromatosis screening PCR showed C282Y/C282Y homozygous mutation. Patient also reports family history of sister was found to have elevated ferritin 600.  Sister and brothers hemochromatosis work-up is pending.  Both of her parents deceased. 2021-09-03, CMP showed normal AST, ALT, bilirubin. She drinks alcohol socially. Positive for arthralgia.  No unintentional weight loss, night sweats, fever.  Patient was accompanied by her husband.  INTERVAL HISTORY Dana Salas is a 51 y.o. female who has above history reviewed by me today presents for follow up visit for homozygous C282Y hemochromatosis Patient reports feeling well.  Chronic fatigue, unchanged. She has occasionally had 1-2 drinks since last visit.  Review of Systems  Constitutional:  Positive for fatigue. Negative for appetite change, chills and fever.  HENT:   Negative for hearing loss and voice change.   Eyes:  Negative for eye problems.  Respiratory:  Negative for chest tightness and cough.   Cardiovascular:  Negative for chest pain.  Gastrointestinal:  Negative for abdominal distention, abdominal pain and blood in stool.  Endocrine: Negative for hot flashes.  Genitourinary:  Negative for difficulty urinating and frequency.   Musculoskeletal:  Positive for arthralgias.  Skin:  Negative for itching and  rash.  Neurological:  Negative for extremity weakness.  Hematological:  Negative for adenopathy.  Psychiatric/Behavioral:  Negative for confusion.    MEDICAL HISTORY:  Past Medical History:  Diagnosis Date   Boil, labium 33/2016   Other abnormal blood chemistry    Iron,serum, elevated   Pneumonia 11/03   outpatient    SURGICAL HISTORY: Past Surgical History:  Procedure Laterality Date   COLONOSCOPY WITH PROPOFOL N/A 04/15/2020   TA, rpt 5 yrs (Tahiliani, Lennette Bihari, MD)    SOCIAL HISTORY: Social History   Socioeconomic History   Marital status: Married    Spouse name: Not on file   Number of children: 3   Years of education: Not on file   Highest education level: Not on file  Occupational History   Occupation: Programme researcher, broadcasting/film/video at [employer]   Smoking status: Never   Smokeless tobacco: Never  Vaping Use   Vaping Use: Never used  Substance and Sexual Activity   Alcohol use: Not Currently    Alcohol/week: 1.0 standard drink    Types: 1 Glasses of wine per week    Comment: occasionally   Drug use: No   Sexual activity: Yes    Birth control/protection: None, Surgical    Comment: vastcotomy  Other Topics Concern   Not on file  Social History Narrative   Not on file   Social Determinants of Health   Financial Resource Strain: Not on file  Food Insecurity: Not on file  Transportation Needs: Not on file  Physical Activity: Not on file  Stress: Not on file  Social Connections: Not on file  Intimate Partner Violence: Not on file    FAMILY HISTORY: Family History  Problem Relation  Age of Onset   Hypertension Father    COPD Father        smoker   Heart attack Father    High Cholesterol Father    Stroke Father    Thyroid disease Father    Hypertension Sister    High Cholesterol Sister    AVM Sister        with bleed (at [facility])   Cancer Maternal Grandmother        unsure type   Breast cancer Paternal Grandmother    Dementia Mother     High Cholesterol Mother    Hypertension Brother    High Cholesterol Brother    Early death Paternal Grandfather 29   Heart attack Paternal Grandfather     ALLERGIES:  has No Known Allergies.  MEDICATIONS:  Current Outpatient Medications  Medication Sig Dispense Refill   cetirizine (ZYRTEC) 10 MG tablet Take 10 mg by mouth daily as needed for allergies. Zyrtec D     fluticasone (FLONASE) 50 MCG/ACT nasal spray Place 1 spray into both nostrils daily.      ibuprofen (ADVIL) 200 MG tablet Take 3 tablets (600 mg total) by mouth 2 (two) times daily as needed for headache.     Melatonin 5 MG CHEW Chew by mouth.     meloxicam (MOBIC) 15 MG tablet Take 1 tablet (15 mg total) by mouth daily. (Patient not taking: Reported on 09/11/2021) 30 tablet 3   No current facility-administered medications for this visit.     PHYSICAL EXAMINATION: ECOG PERFORMANCE STATUS: 0 - Asymptomatic Vitals:   04/23/22 1402  BP: 135/83  Pulse: 79  Resp: 18  Temp: (!) 97.3 F (36.3 C)   Filed Weights   04/23/22 1402  Weight: 161 lb (73 kg)    Physical Exam Constitutional:      General: She is not in acute distress. HENT:     Head: Normocephalic and atraumatic.  Eyes:     General: No scleral icterus. Cardiovascular:     Rate and Rhythm: Normal rate and regular rhythm.     Heart sounds: Normal heart sounds.  Pulmonary:     Effort: Pulmonary effort is normal. No respiratory distress.     Breath sounds: No wheezing.  Abdominal:     General: Bowel sounds are normal. There is no distension.     Palpations: Abdomen is soft.  Musculoskeletal:        General: No deformity. Normal range of motion.     Cervical back: Normal range of motion and neck supple.  Skin:    General: Skin is warm and dry.     Findings: No erythema or rash.  Neurological:     Mental Status: She is alert and oriented to person, place, and time. Mental status is at baseline.     Cranial Nerves: No cranial nerve deficit.      Coordination: Coordination normal.  Psychiatric:        Mood and Affect: Mood normal.     LABORATORY DATA:  I have reviewed the data as listed Lab Results  Component Value Date   WBC 5.9 04/22/2022   HGB 13.9 04/22/2022   HCT 40.8 04/22/2022   MCV 94.7 04/22/2022   PLT 220 04/22/2022   Recent Labs    08/26/21 0902 10/21/21 0802 04/22/22 1352  NA 138 136 136  K 4.3 4.1 4.1  CL 103 105 102  CO2 '29 24 27  '$ GLUCOSE 85 90 94  BUN 12 15 12  CREATININE 0.71 0.82 0.79  CALCIUM 9.3 8.8* 9.1  GFRNONAA  --  >60 >60  PROT 6.1 7.0 7.0  ALBUMIN 4.3 5.0 4.3  AST '16 23 19  '$ ALT '13 17 14  '$ ALKPHOS 53 58 57  BILITOT 0.6 0.6 0.3    Iron/TIBC/Ferritin/ %Sat    Component Value Date/Time   IRON 168 04/22/2022 1352   TIBC 295 04/22/2022 1352   FERRITIN 18 04/22/2022 1352   IRONPCTSAT 57 (H) 04/22/2022 1352       RADIOGRAPHIC STUDIES: I have personally reviewed the radiological images as listed and agreed with the findings in the report.  No results found.   ASSESSMENT & PLAN:  1. Hemochromatosis, hereditary ([place])   2. Abnormal iron saturation    #Hereditary hemochromatosis,C282Y/C282Y homozygous. Labs are reviewed and discussed with Ferritin is at the goal, less than 50. Isolated elevated iron saturation, recommend observation. Avoid alcohol and iron supplementation  Vitamin B12 is less than 400, recommend patient to take over-the-counter B12 supplementation. Follow-up in 6 months.  Orders Placed This Encounter  Procedures   CBC with Differential/Platelet    Standing Status:   Future    Standing Expiration Date:   04/24/2023   Comprehensive metabolic panel    Standing Status:   Future    Standing Expiration Date:   04/23/2023   Iron and TIBC    Standing Status:   Future    Standing Expiration Date:   04/24/2023   Ferritin    Standing Status:   Future    Standing Expiration Date:   04/24/2023   Vitamin B12    Standing Status:   Future    Standing Expiration Date:    04/24/2023    All questions were answered. The patient knows to call the clinic with any problems questions or concerns.  Cc Ria Bush, MD   Earlie Server, MD, PhD  04/23/2022

## 2022-05-06 ENCOUNTER — Ambulatory Visit
Admission: EM | Admit: 2022-05-06 | Discharge: 2022-05-06 | Disposition: A | Discharge status: Home / Self Care | Payer: BC Managed Care – PPO | Attending: Emergency Medicine | Admitting: Emergency Medicine

## 2022-05-06 ENCOUNTER — Encounter: Payer: Self-pay | Admitting: Emergency Medicine

## 2022-05-06 ENCOUNTER — Encounter: Payer: Self-pay | Admitting: Family Medicine

## 2022-05-06 DIAGNOSIS — W57XXXA Bitten or stung by nonvenomous insect and other nonvenomous arthropods, initial encounter: Secondary | ICD-10-CM

## 2022-05-06 DIAGNOSIS — S90861A Insect bite (nonvenomous), right foot, initial encounter: Secondary | ICD-10-CM | POA: Diagnosis not present

## 2022-05-06 MED ORDER — DOXYCYCLINE HYCLATE 100 MG PO TABS: 100.0000 mg | ORAL_TABLET | Freq: Two times a day (BID) | ORAL | 0 refills | Status: AC

## 2022-05-06 MED ORDER — FLUCONAZOLE 150 MG PO TABS: 150.0000 mg | ORAL_TABLET | Freq: Every day | ORAL | 0 refills | Status: DC

## 2022-05-06 NOTE — ED Provider Notes (Signed)
[facility] Provider Note  Patient Contact: 1:23 PM (approximate)   History   Tick Removal   HPI  Dana Salas is a 51 y.o. female who presents to the urgent care for complaint of tick bite to the right foot.  Patient states that 2 days ago she felt like she had a bite to her foot, she was on the way to work and glanced down and did not see anything.  Patient states that she had some increasing pain and swelling to the foot throughout the day.  When she got home she found a tick embedded between the great and second toe.  Patient was able to successfully remove all the time.  Since then she has had some pain, swelling and redness to her foot.  Her sister has a diagnosis of Lyme's and she is concerned that given the edema, pain and unknown length of tick bite that she could be at risk for tickborne illness.  No other symptoms such as fever, body aches, URI symptoms.     Physical Exam   Triage Vital Signs: ED Triage Vitals [05/06/22 1320]  Enc Vitals Group     BP 138/90     Pulse Rate 91     Resp 18     Temp 99 F (37.2 C)     Temp Source Oral     SpO2 95 %     Weight      Height      Head Circumference      Peak Flow      Pain Score 3     Pain Loc      Pain Edu?      Excl. in [place]?     Most recent vital signs: Vitals:   05/06/22 1320  BP: 138/90  Pulse: 91  Resp: 18  Temp: 99 F (37.2 C)  SpO2: 95%     General: Alert and in no acute distress.  Cardiovascular:  Good peripheral perfusion Respiratory: Normal respiratory effort without tachypnea or retractions. Lungs CTAB.  Musculoskeletal: Full range of motion to all extremities.  Slight erythema between the great toe and second toe of the right foot.  There is no embedded tick.  No residual tick is identified between the toes.  She does have some erythema to the area with some tenderness to palpation.  No true bull's-eye or other concerning rash at this time. Neurologic:  No gross focal neurologic  deficits are appreciated.  Skin:   No rash noted Other:   ED Results / Procedures / Treatments   Labs (all labs ordered are listed, but only abnormal results are displayed) Labs Reviewed - No data to display   EKG     RADIOLOGY    No results found.  PROCEDURES:  Critical Care performed: No  Procedures   MEDICATIONS ORDERED IN ED: Medications - No data to display   IMPRESSION / MDM / [place] / ED COURSE  I reviewed the triage vital signs and the nursing notes.                              Differential diagnosis includes, but is not limited to, tick bite, Lyme's, [facility] spotted fever, localized skin reaction  Patient's presentation is most consistent with acute illness / injury with system symptoms.   Patient's diagnosis is consistent with tick bite.  Patient presents to the urgent care complaining of a  tick bite to the right foot.  Patient has had some pain, swelling, redness following removal of a tick between her digits.  She is unsure how long the tick may have been embedded.  Removal was 2 days ago.  At this time patient does not have true targetoid rash or maculopapular rash concerning for [facility] spotted fever or Lyme's.  I will not draw titers given the close onset of symptoms to her tick bite.  That this is likely a localized reaction from the tick bite versus a true tickborne illness.  However since she is symptomatic, we have discussed prophylaxis versus treatment.  At this time even though she removed the tick less than 72 hours ago, unsure how long the tick had been embedded.  [place] barefoot or with foot pumps on.  As such I feel that with symptoms patient should be treated with a full course of doxycycline.  She will have 10 days of doxycycline.  Side effects of medication are discussed with the patient.  Concerning signs and symptoms are discussed with the patient.  Follow-up primary  care as needed..  Patient is given ED precautions to return to the ED for any worsening or new symptoms.        FINAL CLINICAL IMPRESSION(S) / ED DIAGNOSES   Final diagnoses:  Tick bite of right foot, initial encounter     Rx / DC Orders   ED Discharge Orders          Ordered    doxycycline (VIBRA-TABS) 100 MG tablet  2 times daily        05/06/22 1343             Note:  This document was prepared using Dragon voice recognition software and may include unintentional dictation errors.   Darletta Moll, PA-C 05/06/22 1344

## 2022-05-06 NOTE — Telephone Encounter (Addendum)
Pt seen at [facility] today.

## 2022-05-06 NOTE — Telephone Encounter (Signed)
No appts available today.  Spoke with pt scheduling OV tomorrow at 12:00 with Matt.  Fyi to Dr. Darnell Level.

## 2022-05-06 NOTE — ED Triage Notes (Signed)
Pt removed a tick from her right foot 3 days ago. She has some redness and pain on her foot.

## 2022-05-07 ENCOUNTER — Ambulatory Visit: Payer: BC Managed Care – PPO | Admitting: Nurse Practitioner

## 2022-05-21 DIAGNOSIS — M5136 Other intervertebral disc degeneration, lumbar region: Secondary | ICD-10-CM | POA: Diagnosis not present

## 2022-05-21 DIAGNOSIS — M5416 Radiculopathy, lumbar region: Secondary | ICD-10-CM | POA: Diagnosis not present

## 2022-05-21 DIAGNOSIS — M9903 Segmental and somatic dysfunction of lumbar region: Secondary | ICD-10-CM | POA: Diagnosis not present

## 2022-05-21 DIAGNOSIS — M62830 Muscle spasm of back: Secondary | ICD-10-CM | POA: Diagnosis not present

## 2022-06-01 DIAGNOSIS — M5136 Other intervertebral disc degeneration, lumbar region: Secondary | ICD-10-CM | POA: Diagnosis not present

## 2022-06-01 DIAGNOSIS — M62830 Muscle spasm of back: Secondary | ICD-10-CM | POA: Diagnosis not present

## 2022-06-01 DIAGNOSIS — M9903 Segmental and somatic dysfunction of lumbar region: Secondary | ICD-10-CM | POA: Diagnosis not present

## 2022-06-01 DIAGNOSIS — M5416 Radiculopathy, lumbar region: Secondary | ICD-10-CM | POA: Diagnosis not present

## 2022-06-05 DIAGNOSIS — M9903 Segmental and somatic dysfunction of lumbar region: Secondary | ICD-10-CM | POA: Diagnosis not present

## 2022-06-05 DIAGNOSIS — M62830 Muscle spasm of back: Secondary | ICD-10-CM | POA: Diagnosis not present

## 2022-06-05 DIAGNOSIS — M5136 Other intervertebral disc degeneration, lumbar region: Secondary | ICD-10-CM | POA: Diagnosis not present

## 2022-06-05 DIAGNOSIS — M5416 Radiculopathy, lumbar region: Secondary | ICD-10-CM | POA: Diagnosis not present

## 2022-06-10 ENCOUNTER — Encounter: Payer: Self-pay | Admitting: Family Medicine

## 2022-06-12 ENCOUNTER — Encounter: Payer: Self-pay | Admitting: Family

## 2022-06-12 ENCOUNTER — Ambulatory Visit (INDEPENDENT_AMBULATORY_CARE_PROVIDER_SITE_OTHER): Payer: BC Managed Care – PPO | Admitting: Family

## 2022-06-12 ENCOUNTER — Ambulatory Visit: Payer: BC Managed Care – PPO

## 2022-06-12 VITALS — BP 130/76 | HR 75 | Temp 98.6°F | Resp 16 | Ht 63.0 in | Wt 161.1 lb

## 2022-06-12 DIAGNOSIS — S90861A Insect bite (nonvenomous), right foot, initial encounter: Secondary | ICD-10-CM | POA: Diagnosis not present

## 2022-06-12 DIAGNOSIS — L509 Urticaria, unspecified: Secondary | ICD-10-CM | POA: Insufficient documentation

## 2022-06-12 DIAGNOSIS — R12 Heartburn: Secondary | ICD-10-CM | POA: Diagnosis not present

## 2022-06-12 DIAGNOSIS — Z91018 Allergy to other foods: Secondary | ICD-10-CM | POA: Insufficient documentation

## 2022-06-12 DIAGNOSIS — W57XXXA Bitten or stung by nonvenomous insect and other nonvenomous arthropods, initial encounter: Secondary | ICD-10-CM | POA: Insufficient documentation

## 2022-06-12 LAB — CBC WITH DIFFERENTIAL/PLATELET
Basophils Absolute: 0 10*3/uL (ref 0.0–0.1)
Basophils Relative: 0.6 % (ref 0.0–3.0)
Eosinophils Absolute: 0.1 10*3/uL (ref 0.0–0.7)
Eosinophils Relative: 3 % (ref 0.0–5.0)
HCT: 39.8 % (ref 36.0–46.0)
Hemoglobin: 13.7 g/dL (ref 12.0–15.0)
Lymphocytes Relative: 23.8 % (ref 12.0–46.0)
Lymphs Abs: 1.1 10*3/uL (ref 0.7–4.0)
MCHC: 34.3 g/dL (ref 30.0–36.0)
MCV: 93.7 fl (ref 78.0–100.0)
Monocytes Absolute: 0.4 10*3/uL (ref 0.1–1.0)
Monocytes Relative: 8.7 % (ref 3.0–12.0)
Neutro Abs: 3 10*3/uL (ref 1.4–7.7)
Neutrophils Relative %: 63.9 % (ref 43.0–77.0)
Platelets: 218 10*3/uL (ref 150.0–400.0)
RBC: 4.25 Mil/uL (ref 3.87–5.11)
RDW: 12.1 % (ref 11.5–15.5)
WBC: 4.7 10*3/uL (ref 4.0–10.5)

## 2022-06-12 MED ORDER — OMEPRAZOLE 20 MG PO CPDR: 20.0000 mg | DELAYED_RELEASE_CAPSULE | Freq: Every day | ORAL | 0 refills | Status: DC

## 2022-06-12 MED ORDER — EPINEPHRINE 0.3 MG/0.3ML IJ SOAJ: 0.3000 mg | INTRAMUSCULAR | 0 refills | Status: DC | PRN

## 2022-06-12 NOTE — Progress Notes (Signed)
Established Patient Office Visit  Subjective:  Patient ID: Dana Salas, female    DOB: 01/16/1971  Age: 51 y.o. MRN: [id number]  CC:  Chief Complaint  Patient presents with   Insect Bite    Got bitten in June. Been having hives. Only thing that she knows is Art therapist.    HPI Dana Salas is here today with concerns.   Was bitten by a tick back in June. She found it between her toes. Two days after this, she noticed swelling and a rash on her lower toes, and she felt a knot on the ball of her foot while walking. Went to urgent care and was prescribed doxycycline 100 mg bid x 10 days.   She is not sure if this is related, however she has had hives. The only difference she can think of is that she had hamburger, taco one of the nights, spaghetti another night.   She does report some extra heartburn, has right now.  No regular spicy fried fatty foods.  She did have a flank steak wrap last night. Did take a benadryl last night because she as worried about hives.   Jeris Penta the hives occur they are all over her body. The last episode started on the back of her left knee and progressed to bil hands and upper arms with welts. She took benadryl each occurance and went away about a day or so later.  No new toiletries, body wash, detergent, or shampoo. No new medications or change in supplements.   No active rash today.  Past Medical History:  Diagnosis Date   Boil, labium 33/2016   Other abnormal blood chemistry    Iron,serum, elevated   Pneumonia 11/03   outpatient    Past Surgical History:  Procedure Laterality Date   COLONOSCOPY WITH PROPOFOL N/A 04/15/2020   TA, rpt 5 yrs (Tahiliani, Lennette Bihari, MD)    Family History  Problem Relation Age of Onset   Hypertension Father    COPD Father        smoker   Heart attack Father    High Cholesterol Father    Stroke Father    Thyroid disease Father    Hypertension Sister    High Cholesterol Sister    AVM Sister         with bleed (at [facility])   Cancer Maternal Grandmother        unsure type   Breast cancer Paternal Grandmother    Dementia Mother    High Cholesterol Mother    Hypertension Brother    High Cholesterol Brother    Early death Paternal Grandfather 18   Heart attack Paternal Grandfather     Social History   Socioeconomic History   Marital status: Married    Spouse name: Not on file   Number of children: 3   Years of education: Not on file   Highest education level: Not on file  Occupational History   Occupation: Programme researcher, broadcasting/film/video at [employer]  Tobacco Use   Smoking status: Never   Smokeless tobacco: Never  Vaping Use   Vaping Use: Never used  Substance and Sexual Activity   Alcohol use: Not Currently    Alcohol/week: 1.0 standard drink of alcohol    Types: 1 Glasses of wine per week    Comment: occasionally   Drug use: No   Sexual activity: Yes    Birth control/protection: None, Surgical    Comment: vastcotomy  Other Topics Concern  Not on file  Social History Narrative   Not on file   Social Determinants of Health   Financial Resource Strain: Not on file  Food Insecurity: Not on file  Transportation Needs: Not on file  Physical Activity: Not on file  Stress: Not on file  Social Connections: Not on file  Intimate Partner Violence: Not on file    Outpatient Medications Prior to Visit  Medication Sig Dispense Refill   cetirizine (ZYRTEC) 10 MG tablet Take 10 mg by mouth daily as needed for allergies. Zyrtec D     fluticasone (FLONASE) 50 MCG/ACT nasal spray Place 1 spray into both nostrils daily.      ibuprofen (ADVIL) 200 MG tablet Take 3 tablets (600 mg total) by mouth 2 (two) times daily as needed for headache.     Melatonin 5 MG CHEW Chew by mouth.     fluconazole (DIFLUCAN) 150 MG tablet Take 1 tablet (150 mg total) by mouth daily. Take after finishing antibiotic if you have any yeast symptoms (Patient not taking: Reported on 06/12/2022) 1 tablet 0    meloxicam (MOBIC) 15 MG tablet Take 1 tablet (15 mg total) by mouth daily. (Patient not taking: Reported on 09/11/2021) 30 tablet 3   No facility-administered medications prior to visit.    No Known Allergies      Objective:    Physical Exam Constitutional:      General: She is not in acute distress.    Appearance: Normal appearance. She is normal weight. She is not ill-appearing, toxic-appearing or diaphoretic.  HENT:     Right Ear: Tympanic membrane normal.     Left Ear: Tympanic membrane normal.     Nose: Nose normal. No congestion or rhinorrhea.     Right Turbinates: Not enlarged or swollen.     Left Turbinates: Not enlarged or swollen.     Right Sinus: No maxillary sinus tenderness or frontal sinus tenderness.     Left Sinus: No maxillary sinus tenderness or frontal sinus tenderness.     Mouth/Throat:     Mouth: Mucous membranes are moist.     Pharynx: No pharyngeal swelling, oropharyngeal exudate or posterior oropharyngeal erythema.     Tonsils: No tonsillar exudate.  Eyes:     Extraocular Movements: Extraocular movements intact.     Conjunctiva/sclera: Conjunctivae normal.     Pupils: Pupils are equal, round, and reactive to light.  Neck:     Thyroid: No thyroid mass.  Cardiovascular:     Rate and Rhythm: Normal rate and regular rhythm.  Pulmonary:     Effort: Pulmonary effort is normal.     Breath sounds: Normal breath sounds.  Abdominal:     General: Abdomen is flat.     Palpations: Abdomen is soft.     Tenderness: There is abdominal tenderness (mild epigastric tenderness). There is no guarding.  Lymphadenopathy:     Cervical:     Right cervical: No superficial cervical adenopathy.    Left cervical: No superficial cervical adenopathy.  Neurological:     Mental Status: She is alert.     BP 130/76   Pulse 75   Temp 98.6 F (37 C)   Resp 16   Ht '5\' 3"'$  (1.6 m)   Wt 161 lb 2 oz (73.1 kg)   SpO2 94%   BMI 28.54 kg/m  Wt Readings from Last 3 Encounters:   06/12/22 161 lb 2 oz (73.1 kg)  04/23/22 161 lb (73 kg)  10/23/21 155 lb (70.3  kg)     Health Maintenance Due  Topic Date Due   COVID-19 Vaccine (1) Never done   HIV Screening  Never done   Hepatitis C Screening  Never done   TETANUS/TDAP  01/08/2020    There are no preventive care reminders to display for this patient.  Lab Results  Component Value Date   TSH 2.23 08/26/2021   Lab Results  Component Value Date   WBC 5.9 04/22/2022   HGB 13.9 04/22/2022   HCT 40.8 04/22/2022   MCV 94.7 04/22/2022   PLT 220 04/22/2022   Lab Results  Component Value Date   NA 136 04/22/2022   K 4.1 04/22/2022   CO2 27 04/22/2022   GLUCOSE 94 04/22/2022   BUN 12 04/22/2022   CREATININE 0.79 04/22/2022   BILITOT 0.3 04/22/2022   ALKPHOS 57 04/22/2022   AST 19 04/22/2022   ALT 14 04/22/2022   PROT 7.0 04/22/2022   ALBUMIN 4.3 04/22/2022   CALCIUM 9.1 04/22/2022   ANIONGAP 7 04/22/2022   GFR 100.70 08/26/2021   No results found for: "HGBA1C"    Assessment & Plan:   Problem List Items Addressed This Visit       Musculoskeletal and Integument   Hives - Primary    Not at current Sending in epi pen in case reaction worsens Ordering food allergy panel, alpha gal and cbc pending results  Benadryl prn       Relevant Medications   omeprazole (PRILOSEC) 20 MG capsule   EPINEPHrine 0.3 mg/0.3 mL IJ SOAJ injection   Other Relevant Orders   Alpha-Gal Panel   Food Allergy Profile w/ Reflexes   CBC with Differential   B. burgdorfi antibodies by WB   Tick bite of right foot    Completed doxycycline therapy  Ordering lyme disease testing pending results       Relevant Orders   B. burgdorfi antibodies by WB     Other   Heart burn    Try to decrease and or avoid spicy foods, fried fatty foods, and also caffeine and chocolate as these can increase heartburn symptoms.  Trial ppi, omeprazole 20 mg once daily sent in to pharmacy      Food allergy    For now pending testing  avoid mammalian meats May consider allergen referral if nothing obvious  Ordering food allergy reflexes pendings results      Relevant Orders   Alpha-Gal Panel   Food Allergy Profile w/ Reflexes   CBC with Differential   B. burgdorfi antibodies by WB    Meds ordered this encounter  Medications   omeprazole (PRILOSEC) 20 MG capsule    Sig: Take 1 capsule (20 mg total) by mouth daily.    Dispense:  30 capsule    Refill:  0    Order Specific Question:   Supervising Provider    Answer:   BEDSOLE, AMY E [[id number]]   EPINEPHrine 0.3 mg/0.3 mL IJ SOAJ injection    Sig: Inject 0.3 mg into the muscle as needed for anaphylaxis.    Dispense:  1 each    Refill:  0    Order Specific Question:   Supervising Provider    Answer:   BEDSOLE, AMY E [[id number]    Follow-up: No follow-ups on file.    Eugenia Pancoast, FNP

## 2022-06-12 NOTE — Assessment & Plan Note (Addendum)
For now pending testing avoid mammalian meats May consider allergen referral if nothing obvious  Ordering food allergy reflexes pendings results

## 2022-06-12 NOTE — Assessment & Plan Note (Signed)
Not at current Sending in epi pen in case reaction worsens Ordering food allergy panel, alpha gal and cbc pending results  Benadryl prn

## 2022-06-12 NOTE — Assessment & Plan Note (Signed)
Completed doxycycline therapy  Ordering lyme disease testing pending results

## 2022-06-12 NOTE — Telephone Encounter (Signed)
Patient is calling in regards to this message, patient is scheduled next week. Dana Salas wanted to know if she can get blood work done prior to [employer] on Wednesday.

## 2022-06-12 NOTE — Assessment & Plan Note (Signed)
Try to decrease and or avoid spicy foods, fried fatty foods, and also caffeine and chocolate as these can increase heartburn symptoms.  Trial ppi, omeprazole 20 mg once daily sent in to pharmacy

## 2022-06-12 NOTE — Telephone Encounter (Signed)
Spoke with pt scheduling OV today with Tabitha at 9:20.

## 2022-06-12 NOTE — Patient Instructions (Signed)
Stop by the lab prior to leaving today. I will notify you of your results once received.   Due to recent changes in healthcare laws, you may see results of your imaging and/or laboratory studies on MyChart before I have had a chance to review them.  I understand that in some cases there may be results that are confusing or concerning to you. Please understand that not all results are received at the same time and often I may need to interpret multiple results in order to provide you with the best plan of care or course of treatment. Therefore, I ask that you please give me 2 business days to thoroughly review all your results before contacting my office for clarification. Should we see a critical lab result, you will be contacted sooner.   It was a pleasure seeing you today! Please do not hesitate to reach out with any questions and or concerns.  Regards,   Ottis Vacha FNP-C  

## 2022-06-15 NOTE — Progress Notes (Signed)
A lot of food allergies, please review with pt. Happy to send allergist referral if pt interested.

## 2022-06-16 LAB — PEANUT COMPONENT PANEL REFLEX
Ara h 1 (f422): <0.1 kU/L (ref <0.10)
Ara h 2 (f423): <0.1 kU/L (ref <0.10)
Ara h 3 (f424): <0.1 kU/L (ref <0.10)
Ara h 8 (f352): <0.1 kU/L (ref <0.10)
Ara h 9 (f427: <0.1 kU/L (ref <0.10)
F447-IgE Ara h 6: <0.1 kU/L (ref <0.10)

## 2022-06-16 LAB — FOOD ALLERGY PROFILE W/ REFLEXES
Allergen, Salmon, f41: 0.12 kU/L — ABNORMAL HIGH
Almonds: 0.1 kU/L — ABNORMAL HIGH
CLASS: 0
CLASS: 0
CLASS: 0
CLASS: 0
Cashew IgE: <0.1 kU/L
Class: 2
Egg White IgE: 0.13 kU/L — ABNORMAL HIGH
Fish Cod: <0.1 kU/L
Hazelnut: 0.11 kU/L — ABNORMAL HIGH
Milk IgE: 2.78 kU/L — ABNORMAL HIGH
Peanut IgE: 0.23 kU/L — ABNORMAL HIGH
Scallop IgE: 0.12 kU/L — ABNORMAL HIGH
Sesame Seed f10: 0.22 kU/L — ABNORMAL HIGH
Shrimp IgE: 0.33 kU/L — ABNORMAL HIGH
Soybean IgE: 0.13 kU/L — ABNORMAL HIGH
Tuna IgE: <0.1 kU/L
Walnut: <0.1 kU/L
Wheat IgE: 0.18 kU/L — ABNORMAL HIGH

## 2022-06-16 LAB — B. BURGDORFI ANTIBODIES BY WB
B burgdorferi IgG Abs (IB): NEGATIVE
B burgdorferi IgM Abs (IB): NEGATIVE
Lyme Disease 18 kD IgG: NONREACTIVE
Lyme Disease 23 kD IgG: NONREACTIVE
Lyme Disease 23 kD IgM: NONREACTIVE
Lyme Disease 28 kD IgG: NONREACTIVE
Lyme Disease 30 kD IgG: NONREACTIVE
Lyme Disease 39 kD IgG: NONREACTIVE
Lyme Disease 39 kD IgM: NONREACTIVE
Lyme Disease 41 kD IgG: NONREACTIVE
Lyme Disease 41 kD IgM: REACTIVE — AB
Lyme Disease 45 kD IgG: NONREACTIVE
Lyme Disease 58 kD IgG: REACTIVE — AB
Lyme Disease 66 kD IgG: NONREACTIVE
Lyme Disease 93 kD IgG: NONREACTIVE

## 2022-06-16 LAB — MILK COMPONENT PANEL RFLX
Allergen, Alpha-lactalb,f76: <0.1 kU/L
Allergen, Beta-lactoglob,f77: <0.1 kU/L
Allergen, Casein, f78: 0.12 kU/L — ABNORMAL HIGH
CLASS: 0
Class: 0

## 2022-06-16 LAB — ALPHA-GAL PANEL
Allergen, Mutton, f88: 12 kU/L — ABNORMAL HIGH
Allergen, Pork, f26: 12.9 kU/L — ABNORMAL HIGH
Beef: 30.4 kU/L — ABNORMAL HIGH
CLASS: 3
CLASS: 4
Class: 3
GALACTOSE-ALPHA-1,3-GALACTOSE IGE*: >100 kU/L — ABNORMAL HIGH (ref <0.10)

## 2022-06-16 LAB — EGG COMPONENT PANEL REFLEX
Allergen, Ovalbumin, f232: <0.1 kU/L
Allergen, Ovomucoid, f233: <0.1 kU/L
CLASS: 0
CLASS: 0

## 2022-06-16 LAB — INTERPRETATION:

## 2022-06-17 ENCOUNTER — Other Ambulatory Visit: Payer: Self-pay | Admitting: Family

## 2022-06-17 ENCOUNTER — Ambulatory Visit: Payer: BC Managed Care – PPO | Admitting: Family Medicine

## 2022-06-17 DIAGNOSIS — E8809 Other disorders of plasma-protein metabolism, not elsewhere classified: Secondary | ICD-10-CM

## 2022-06-17 DIAGNOSIS — Z91018 Allergy to other foods: Secondary | ICD-10-CM

## 2022-06-18 DIAGNOSIS — M62830 Muscle spasm of back: Secondary | ICD-10-CM | POA: Diagnosis not present

## 2022-06-18 DIAGNOSIS — M5136 Other intervertebral disc degeneration, lumbar region: Secondary | ICD-10-CM | POA: Diagnosis not present

## 2022-06-18 DIAGNOSIS — M9903 Segmental and somatic dysfunction of lumbar region: Secondary | ICD-10-CM | POA: Diagnosis not present

## 2022-06-18 DIAGNOSIS — M5416 Radiculopathy, lumbar region: Secondary | ICD-10-CM | POA: Diagnosis not present

## 2022-07-10 ENCOUNTER — Telehealth: Payer: Self-pay | Admitting: Cardiovascular Disease

## 2022-07-10 NOTE — Telephone Encounter (Signed)
Will fwd to Dr. Rockey Situ to ok

## 2022-07-10 NOTE — Telephone Encounter (Signed)
Ham, Pilar A  P Cv Div [facility] Triage Please verify if patient needs Ct Calcium score.        Previous Messages    ----- Message -----  From: Ace Gins  Sent: 07/10/2022   1:15 PM EDT  To: Rica Mast  Subject: FW: CT cardiac scoring                          ----- Message -----  From: Foye Clock  Sent: 07/09/2022  10:54 AM EDT  To: Eli Phillips; Caryl Pina Gerringer  Subject: CT cardiac scoring                               Good Afternoon ,   Robyn Crocket (10/15/1961), called stating that her sister,Patrice Lux (Jul 09, 1971), spoke to Dr. Rockey Situ about being able to schedule a CT. She does not currently have an epic profile. Please advise and her callback number is below.   Thank you      Best call back number : [phone]

## 2022-07-13 NOTE — Telephone Encounter (Signed)
Minna Merritts, MD  Sent: Fri July 10, 2022  5:06 PM  To: Desmond Dike [facility] Triage          Message  We can order calcium score on this patient  They will need to be put into epic  Thx  TG    Will forward to scheduling to please open an account on this patient's sister: Altamese  ([birth year]) Best call back number : [phone]    Once the patient has an account created, you can sent the message back to me and I will place the CT Calcium score order and contact the patient.

## 2022-07-14 ENCOUNTER — Other Ambulatory Visit: Payer: Self-pay | Admitting: Family

## 2022-07-14 DIAGNOSIS — L509 Urticaria, unspecified: Secondary | ICD-10-CM

## 2022-07-14 NOTE — Telephone Encounter (Signed)
Account created for Dana Salas and encounter opened for her regarding this issue.  Will close this encounter.

## 2022-07-16 DIAGNOSIS — M5416 Radiculopathy, lumbar region: Secondary | ICD-10-CM | POA: Diagnosis not present

## 2022-07-16 DIAGNOSIS — M9903 Segmental and somatic dysfunction of lumbar region: Secondary | ICD-10-CM | POA: Diagnosis not present

## 2022-07-16 DIAGNOSIS — M62830 Muscle spasm of back: Secondary | ICD-10-CM | POA: Diagnosis not present

## 2022-07-16 DIAGNOSIS — M5136 Other intervertebral disc degeneration, lumbar region: Secondary | ICD-10-CM | POA: Diagnosis not present

## 2022-08-12 ENCOUNTER — Ambulatory Visit: Payer: BC Managed Care – PPO | Admitting: Allergy

## 2022-08-13 DIAGNOSIS — M5416 Radiculopathy, lumbar region: Secondary | ICD-10-CM | POA: Diagnosis not present

## 2022-08-13 DIAGNOSIS — M62830 Muscle spasm of back: Secondary | ICD-10-CM | POA: Diagnosis not present

## 2022-08-13 DIAGNOSIS — M5136 Other intervertebral disc degeneration, lumbar region: Secondary | ICD-10-CM | POA: Diagnosis not present

## 2022-08-13 DIAGNOSIS — M9903 Segmental and somatic dysfunction of lumbar region: Secondary | ICD-10-CM | POA: Diagnosis not present

## 2022-08-19 ENCOUNTER — Ambulatory Visit: Payer: BC Managed Care – PPO

## 2022-08-19 ENCOUNTER — Encounter: Payer: Self-pay | Admitting: Podiatry

## 2022-08-19 ENCOUNTER — Ambulatory Visit (INDEPENDENT_AMBULATORY_CARE_PROVIDER_SITE_OTHER): Payer: BC Managed Care – PPO | Admitting: Podiatry

## 2022-08-19 DIAGNOSIS — M7752 Other enthesopathy of left foot: Secondary | ICD-10-CM

## 2022-08-19 DIAGNOSIS — M25872 Other specified joint disorders, left ankle and foot: Secondary | ICD-10-CM | POA: Diagnosis not present

## 2022-08-19 NOTE — Progress Notes (Signed)
She presents today for chief complaint of forefoot pain left.  She states that she was standing on a dining room chair reaching into a cabinet and then stepped down.  She states that she landed kind of hard on the forefoot with her toe bent back stating that she had excruciating pain at that time.  It slowly diminished in severity over a few days and has since become more painful and throbby.  Objective: Vital signs are stable she is alert and oriented x3.  Pulses are palpable.  She has severe pain on palpation of the fibular sesamoid and on range of motion of the first metatarsophalangeal joint.  She also has some tenderness on palpation of the tibial sesamoid.  Radiographs taken today demonstrating sesamoid axial AP oblique and lateral do not demonstrate a fracture of the sesamoid though it does demonstrate some osteoarthritic changes of the tibial sesamoid with a cystic lesion.  Assessment: Ligaments a contusion foot particularly the sesamoids with sesamoiditis.  Plan: Discussed etiology pathology conservative versus surgical therapies.  Put her in a Darco shoe started her on mobic and she will take Tylenol if necessary.  She now has a history of Alphagan from a tick bite.

## 2022-08-20 ENCOUNTER — Telehealth: Payer: Self-pay

## 2022-08-20 MED ORDER — MELOXICAM 7.5 MG PO TABS: 7.5000 mg | ORAL_TABLET | Freq: Every day | ORAL | 0 refills | Status: DC

## 2022-08-20 NOTE — Telephone Encounter (Signed)
Mobic sent into pharmacy

## 2022-08-20 NOTE — Addendum Note (Signed)
Addended by: Clovis Riley E on: 08/20/2022 12:13 PM   Modules accepted: Orders

## 2022-08-28 ENCOUNTER — Encounter: Payer: Self-pay | Admitting: Family Medicine

## 2022-08-30 IMAGING — US US ABDOMEN LIMITED
1 series · 14 of 25 positions shown · non-contrast
Comparison: None.

CLINICAL DATA: Hereditary hemochromatosis.

EXAM:
ULTRASOUND ABDOMEN LIMITED RIGHT UPPER QUADRANT

[Series 1: us abdomen limited ruq (liver/gb) · 14 of 74 slices shown]
[im 1/74]
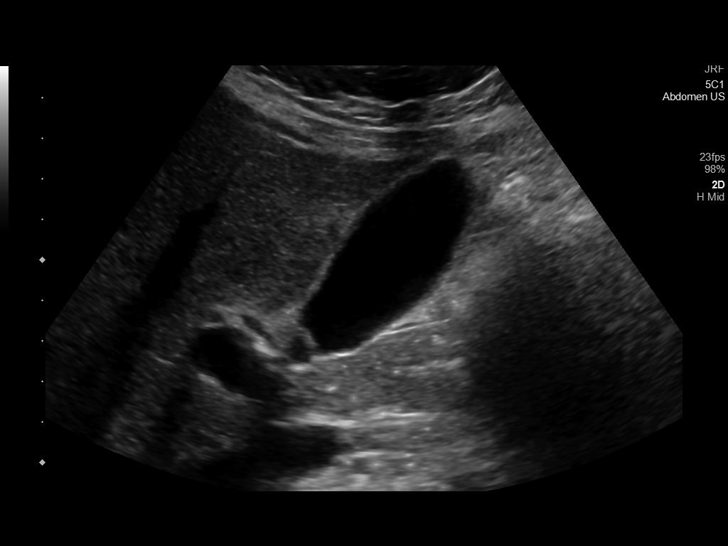
[im 7/74]
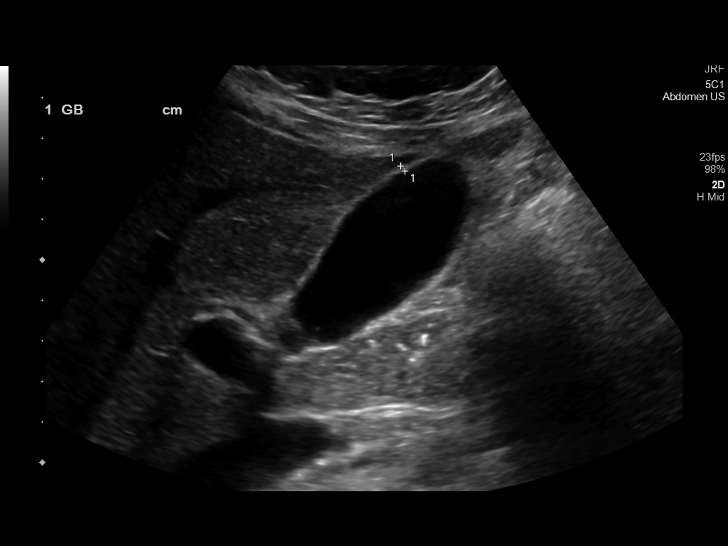
[im 13/74]
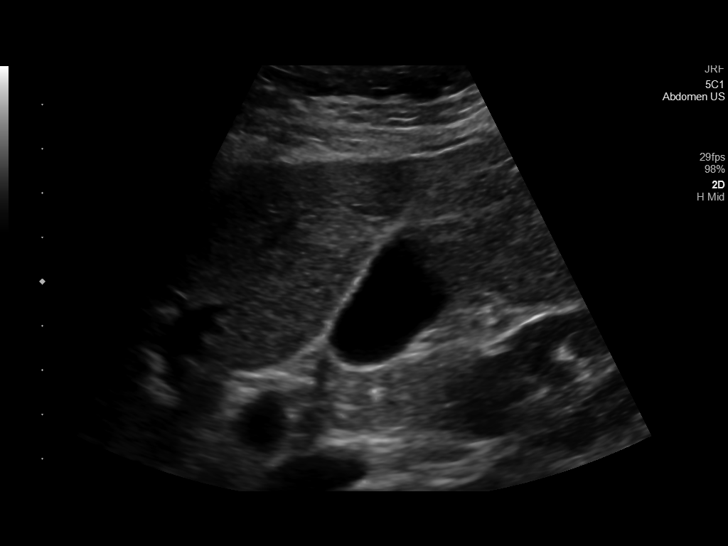
[im 19/74]
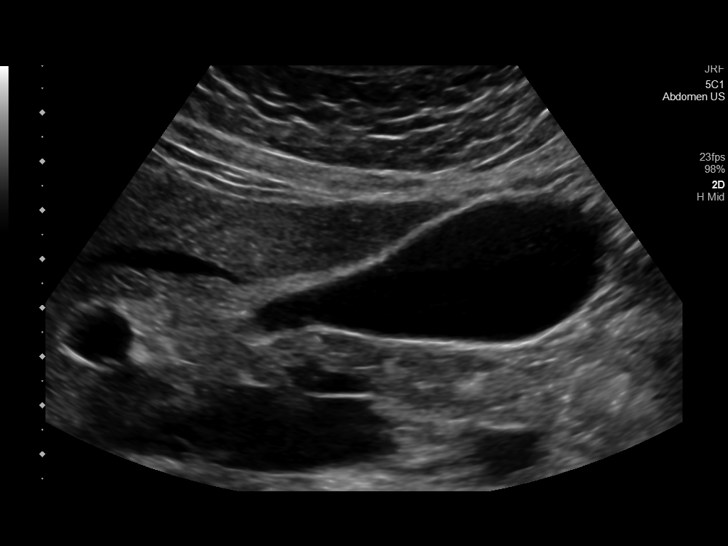
[im 25/74]
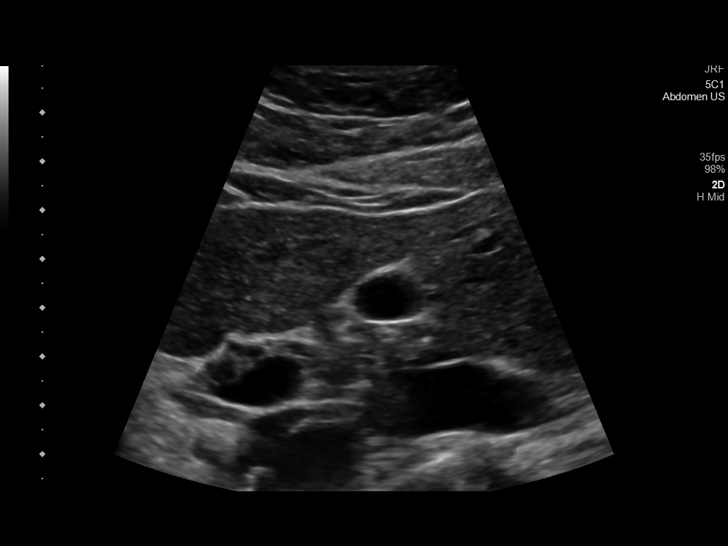
[im 28/74]
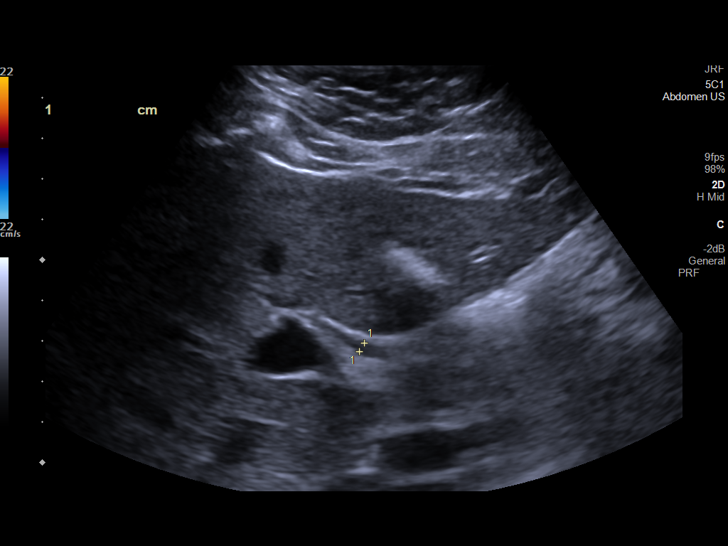
[im 34/74]
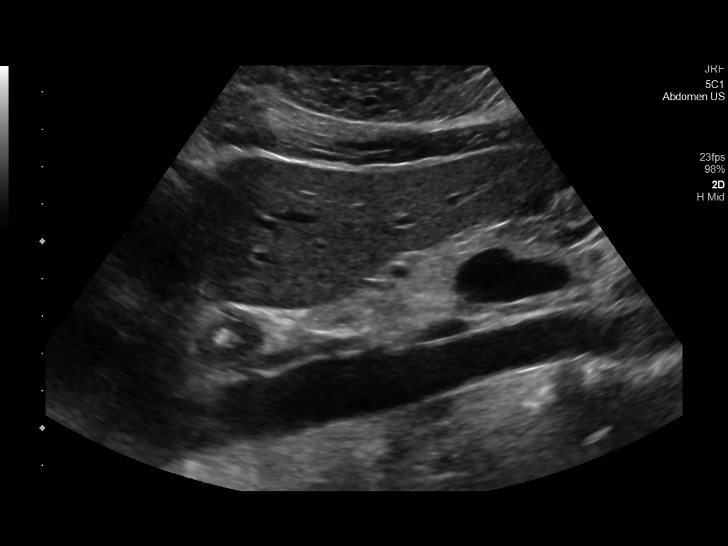
[im 40/74]
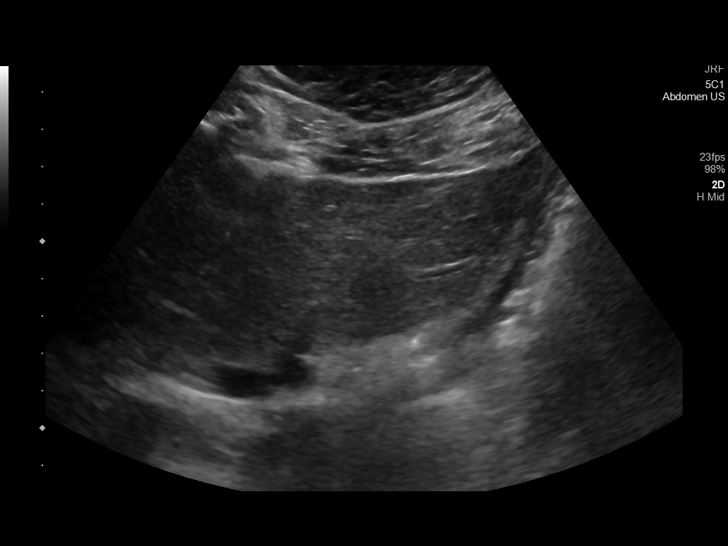
[im 46/74]
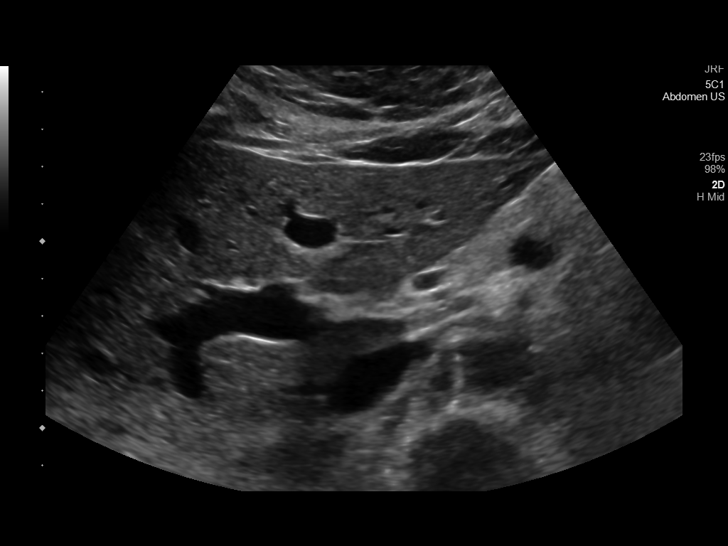
[im 49/74]
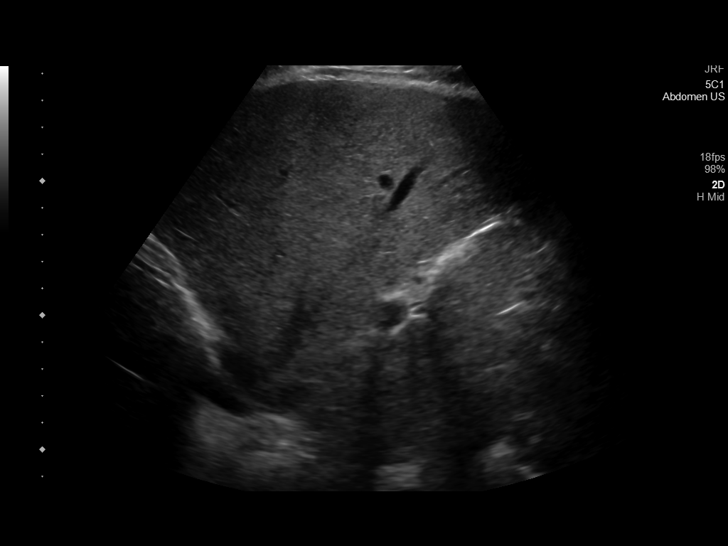
[im 55/74]
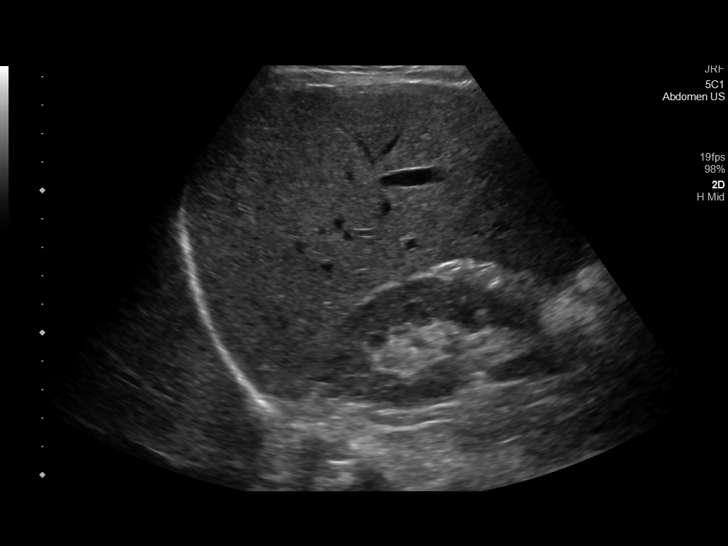
[im 61/74]
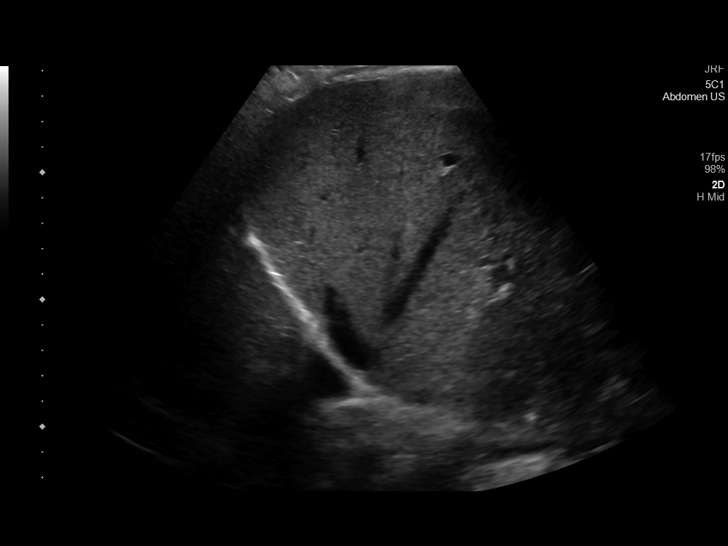
[im 67/74]
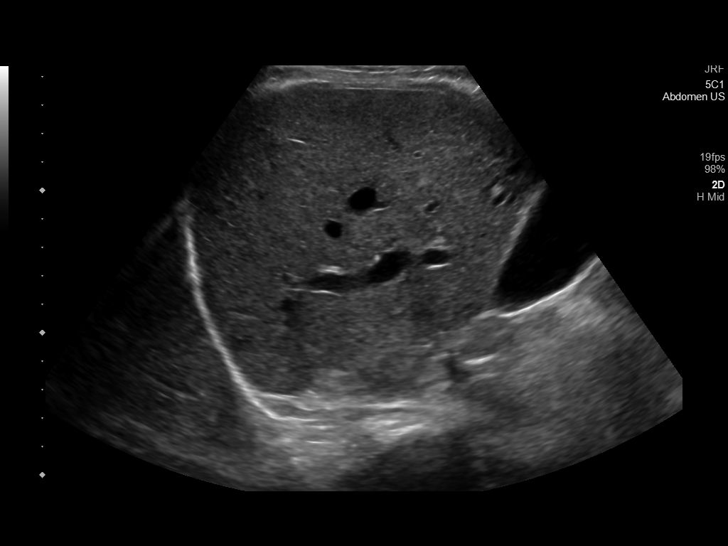
[im 74/74]
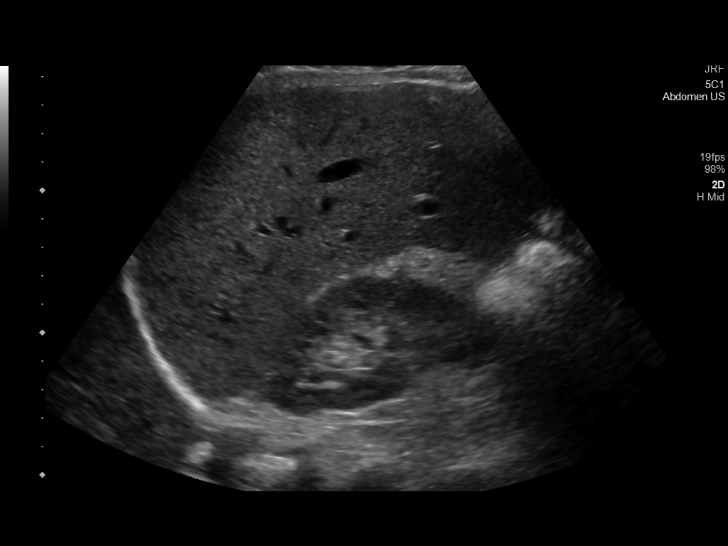

[14 of 25 positions shown; findings below may reference images not displayed]

FINDINGS: Gallbladder:

No gallstones or wall thickening visualized (1.6 mm). No sonographic
Murphy sign noted by sonographer.

Common bile duct:

Diameter: 4.0 mm

Liver:

No focal lesion identified. Within normal limits in parenchymal
echogenicity. Portal vein is patent on color Doppler imaging with
normal direction of blood flow towards the liver.

Other: None.
IMPRESSION: Normal right upper quadrant ultrasound.

## 2022-09-02 ENCOUNTER — Encounter: Payer: Self-pay | Admitting: Internal Medicine

## 2022-09-02 ENCOUNTER — Ambulatory Visit (INDEPENDENT_AMBULATORY_CARE_PROVIDER_SITE_OTHER): Payer: BC Managed Care – PPO | Admitting: Internal Medicine

## 2022-09-02 VITALS — BP 110/64 | HR 78 | Temp 98.2°F | Resp 12 | Ht 63.25 in | Wt 160.0 lb

## 2022-09-02 DIAGNOSIS — T7800XA Anaphylactic reaction due to unspecified food, initial encounter: Secondary | ICD-10-CM | POA: Diagnosis not present

## 2022-09-02 DIAGNOSIS — L501 Idiopathic urticaria: Secondary | ICD-10-CM

## 2022-09-02 DIAGNOSIS — J3089 Other allergic rhinitis: Secondary | ICD-10-CM | POA: Diagnosis not present

## 2022-09-02 DIAGNOSIS — H1013 Acute atopic conjunctivitis, bilateral: Secondary | ICD-10-CM

## 2022-09-02 DIAGNOSIS — H1045 Other chronic allergic conjunctivitis: Secondary | ICD-10-CM

## 2022-09-02 NOTE — Progress Notes (Signed)
New Patient Note  RE: Dana Salas MRN: [id number] DOB: 06/15/1971 Date of Office Visit: 09/02/2022  Consult requested by: Ria Bush, MD Primary care provider: Ria Bush, MD  Chief Complaint: Allergies (Alpha gal positive in July. GI issues with milk products.  Spring worst season. )  History of Present Illness: I had the pleasure of seeing Dana Salas for initial evaluation at the [facility] on 09/02/2022. She is a 51 y.o. female, who is referred here by Ria Bush, MD for the evaluation of alpha gal disease, allergic rhinitis, food allergies.  History obtained from patient, chart review and  husband .  She found a tick on her in June in between her toes for unknown period of time.  She developed foot swelling and presented to UC where she received doxycyline.  Over the next few weeks she had 3 episodes of waking up with urticaria and realized she ate hamburger meat the night prior.  She has had rare causes of episodes of urticaria.  She presented to PCP who tested her for alpha gal and sIgE returned > 100.  She started avoiding mammalian meat, however has since had issues with dairy which will present as diarrhea.  The dairy reactions are inconsistent.  She has had a prior history of bloating with dairy, but never diarrhea.    She has had spontaneous hives while  sleeping   Chronic rhinitis: started as a young child  Symptoms include: nasal congestion, rhinorrhea, post nasal drainage, sneezing, watery eyes, itchy eyes, and itchy nose  Occurs  episodic  Potential triggers: unknown Treatments tried:  benadryl  Previous allergy testing: no History of reflux/heartburn: yes History of chronic sinusitis or sinus surgery: no Nonallergic triggers: denies     Assessment and Plan: Dana Salas is a 51 y.o. female with: Allergy with anaphylaxis due to food - Plan: Allergy Test  Other allergic rhinitis - Plan: Allergy Test  Idiopathic urticaria -  Plan: Allergy Test  Other chronic allergic conjunctivitis of both eyes - Plan: Allergy Test Plan: Patient Instructions  Food allergy Tesing:  - today's skin testing was borderline to milk, negative to all other foods - please strictly avoid mammalian meat, cows milk - okay to resume eating baked milk, all other foods - for SKIN only reaction, okay to take Benadryl 1 capsules every 6 hours - for SKIN + ANY additional symptoms, OR IF concern for LIFE THREATENING reaction = Epipen Autoinjector EpiPen 0.3 mg. - If using Epinephrine autoinjector, call 911 - A food allergy action plan has been provided and discussed. - Medic Alert identification is recommended.  Allergic  Rhinitis moderately well controlled: - allergy testing today was positive grass pollen, weed pollen, tree pollen, molds, dust mite, cat, dog - allergen avoidance as below - consider allergy shots as long term control of your symptoms by teaching your immune system to be more tolerant of your allergy triggers - Continue Nasal Steroid Spray: Options include Flonase (fluticasone), Nasocort (triamcinolone), Nasonex (mometasome) 1- 2 sprays in each nostril daily (can buy over-the-counter if not covered by insurance)  Best results if used daily. - Consider Astelin (Azelastine) 1-2 sprays in each nostril twice a day as needed.  You may use this as needed for nasal congestion/itchy ears/itchy nose if desired - Continue over the counter antihistamine daily or daily as needed.   -Your options include Zyrtec (Cetirizine) '10mg'$ , Claritin (Loratadine) '10mg'$ , Allegra (Fexofenadine) '180mg'$ , or Xyzal (Levocetirinze) '5mg'$   Allergic Conjunctivitis:  - Consider Allergy  Eye drops: great options include Pataday (Olopatadine) or Zaditor (ketotifen) for eye symptoms daily as needed-both sold over the counter if not covered by insurance.   -Avoid eye drops that say red eye relief as they may contain medications that dry out your eyes.  Mammalian Meat  Allergy (galactose-alpha-1, 3-galactose allergy AKA Alpha Gal): - Strictly avoid all mammalian meat (beef, pork, venison, lamb, goat, and bison, etc) - You may continue to eat all poultry and seafood - Some patients may start to develop symptoms due to gelatin which is found in many over the counter medications and vaccines - Some patient may go on to develop symptoms to cows milk  - Please keep symptom diary for any new or unexplained reactions  - Always carry your EpiPen with you at all times to be used in case of severe reaction - consider retesting in a few years  Follow up: 6 months or sooner if problems   Thank you so much for letting me partake in your care today.  Don't hesitate to reach out if you have any additional concerns!  Roney Marion, MD  [facility] [place] [place]  Reducing Pollen Exposure  The American Academy of Allergy, Asthma and Immunology suggests the following steps to reduce your exposure to pollen during allergy seasons.    Do not hang sheets or clothing out to dry; pollen may collect on these items. Do not mow lawns or spend time around freshly cut grass; mowing stirs up pollen. Keep windows closed at night.  Keep car windows closed while driving. Minimize morning activities outdoors, a time when pollen counts are usually at their highest. Stay indoors as much as possible when pollen counts or humidity is high and on windy days when pollen tends to remain in the air longer. Use air conditioning when possible.  Many air conditioners have filters that trap the pollen spores. Use a HEPA room air filter to remove pollen form the indoor air you breathe.  Control of Dog or Cat Allergen  Avoidance is the best way to manage a dog or cat allergy. If you have a dog or cat and are allergic to dog or cats, consider removing the dog or cat from the home. If you have a dog or cat but don't want to find it a new home, or if your family wants a pet even  though someone in the household is allergic, here are some strategies that may help keep symptoms at bay:  Keep the pet out of your bedroom and restrict it to only a few rooms. Be advised that keeping the dog or cat in only one room will not limit the allergens to that room. Don't pet, hug or kiss the dog or cat; if you do, wash your hands with soap and water. High-efficiency particulate air (HEPA) cleaners run continuously in a bedroom or living room can reduce allergen levels over time. Regular use of a high-efficiency vacuum cleaner or a central vacuum can reduce allergen levels. Giving your dog or cat a bath at least once a week can reduce airborne allergen.  Control of Mold Allergen   Mold and fungi can grow on a variety of surfaces provided certain temperature and moisture conditions exist.  Outdoor molds grow on plants, decaying vegetation and soil.  The major outdoor mold, Alternaria and Cladosporium, are found in very high numbers during hot and dry conditions.  Generally, a late Summer - Fall peak is seen for common outdoor fungal spores.  Rain will temporarily lower outdoor mold spore count, but counts rise rapidly when the rainy period ends.  The most important indoor molds are Aspergillus and Penicillium.  Dark, humid and poorly ventilated basements are ideal sites for mold growth.  The next most common sites of mold growth are the bathroom and the kitchen.  Outdoor (Seasonal) Mold Control  Positive outdoor molds via skin testing: Alternaria, Cladosporium, Drechslera (Curvalaria), and Mucor  Use air conditioning and keep windows closed Avoid exposure to decaying vegetation. Avoid leaf raking. Avoid grain handling. Consider wearing a face mask if working in moldy areas.    Indoor (Perennial) Mold Control   Positive indoor molds via skin testing: Aspergillus  Maintain humidity below 50%. Clean washable surfaces with 5% bleach solution. Remove sources e.g. contaminated  carpets.     No orders of the defined types were placed in this encounter.  Lab Orders  No laboratory test(s) ordered today    Other allergy screening: Asthma: no Rhino conjunctivitis: yes Food allergy: yes Medication allergy: no Hymenoptera allergy: no Urticaria: yes Eczema:no History of recurrent infections suggestive of immunodeficency: no  Diagnostics: Skin Testing: Environmental allergy panel and select foods. positive grass pollen, weed pollen, tree pollen, molds, dust mite, cat, dog, borderline to milk  Negative to all other foods  Results interpreted by myself and discussed with patient/family.  Airborne Adult Perc - 09/02/22 1603     Time Antigen Placed 1603    Allergen Manufacturer Lavella Hammock    Location Back    Number of Test 59    Panel 1 Select             Food Perc - 09/02/22 1604       Test Information   Time Antigen Placed 2119    Allergen Manufacturer Lavella Hammock    Location Back    Number of allergen test 10    Food Select      Food   1. Peanut Negative    2. Soybean food Negative    3. Wheat, whole Negative    4. Sesame Negative    5. Milk, cow 2+    6. Egg White, chicken Negative    7. Casein Negative    8. Shellfish mix Negative    9. Fish mix Negative    10. Cashew Negative             Past Medical History: Patient Active Problem List   Diagnosis Date Noted   Hives 06/12/2022   Heart burn 06/12/2022   Tick bite of right foot 06/12/2022   Food allergy 06/12/2022   Hemochromatosis, hereditary ([place]) 09/02/2021   Hyperlipidemia 08/26/2021   Perimenopause 08/26/2021   Polyp of colon    Chronic hand pain, right 03/18/2020   Iron excess 04/30/2009   Past Medical History:  Diagnosis Date   Boil, labium 33/2016   Other abnormal blood chemistry    Iron,serum, elevated   Pneumonia 11/03   outpatient   Past Surgical History: Past Surgical History:  Procedure Laterality Date   COLONOSCOPY WITH PROPOFOL N/A 04/15/2020   TA, rpt  5 yrs (Tahiliani, [place] B, MD)   Medication List:  Current Outpatient Medications  Medication Sig Dispense Refill   cetirizine (ZYRTEC) 10 MG tablet Take 10 mg by mouth daily as needed for allergies. Zyrtec D     EPINEPHrine 0.3 mg/0.3 mL IJ SOAJ injection Inject 0.3 mg into the muscle as needed for anaphylaxis. 1 each 0   fluticasone (FLONASE) 50 MCG/ACT nasal spray  Place 1 spray into both nostrils daily.      ibuprofen (ADVIL) 200 MG tablet Take 3 tablets (600 mg total) by mouth 2 (two) times daily as needed for headache.     meloxicam (MOBIC) 7.5 MG tablet Take 1 tablet (7.5 mg total) by mouth daily. 30 tablet 0   omeprazole (PRILOSEC) 20 MG capsule TAKE 1 CAPSULE BY MOUTH DAILY 30 capsule 0   No current facility-administered medications for this visit.   Allergies: Allergies  Allergen Reactions   Alpha-Gal Diarrhea, Hives and Nausea Only   Social History: Social History   Socioeconomic History   Marital status: Married    Spouse name: Not on file   Number of children: 3   Years of education: Not on file   Highest education level: Not on file  Occupational History   Occupation: Programme researcher, broadcasting/film/video at [employer] Use   Smoking status: Never    Passive exposure: Never   Smokeless tobacco: Never  Vaping Use   Vaping Use: Never used  Substance and Sexual Activity   Alcohol use: Not Currently    Alcohol/week: 1.0 standard drink of alcohol    Types: 1 Glasses of wine per week    Comment: occasionally   Drug use: No   Sexual activity: Yes    Birth control/protection: None, Surgical    Comment: vastcotomy  Other Topics Concern   Not on file  Social History Narrative   Not on file   Social Determinants of Health   Financial Resource Strain: Not on file  Food Insecurity: Not on file  Transportation Needs: Not on file  Physical Activity: Not on file  Stress: Not on file  Social Connections: Not on file   Lives in a single-family home that is 49 years old.   There are no roaches in the house and bed is 2 feet off the floor.  He does not dust mite precautions on bed or pillows.  He states she is not exposed to fumes, chemicals or dust.  There is no HEPA filter in the home and home [place] not near an [place] area . Smoking: No exposure Occupation: Therapist, nutritional HistoryFreight forwarder in the house: no Charity fundraiser in the family room: no Carpet in the bedroom: yes Heating: electric Cooling: central Pet: yes to hypoallergenic dogs with access to bedroom  Family History: Family History  Problem Relation Age of Onset   Dementia Mother    High Cholesterol Mother    Hypertension Father    COPD Father        smoker   Heart attack Father    High Cholesterol Father    Stroke Father    Thyroid disease Father    Hypertension Sister    High Cholesterol Sister    Allergic rhinitis Sister    Asthma Sister    AVM Sister        with bleed (at [facility])   Hypertension Brother    High Cholesterol Brother    Cancer Maternal Grandmother        unsure type   Breast cancer Paternal Grandmother    Early death Paternal Grandfather 65   Heart attack Paternal Grandfather    Eczema Neg Hx    Immunodeficiency Neg Hx    Urticaria Neg Hx      ROS: All others negative except as noted per HPI.   Objective: BP 110/64   Pulse 78   Temp 98.2 F (36.8 C) (Temporal)  Resp 12   Ht 5' 3.25" (1.607 m)   Wt 160 lb (72.6 kg)   SpO2 100%   BMI 28.12 kg/m  Body mass index is 28.12 kg/m.  General Appearance:  Alert, cooperative, no distress, appears stated age  Head:  Normocephalic, without obvious abnormality, atraumatic  Eyes:  Conjunctiva clear, EOM's intact  Nose: Nares normal, normal mucosa, no visible anterior polyps, and septum midline  Throat: Lips, tongue normal; teeth and gums normal, normal posterior oropharynx and no tonsillar exudate  Neck: Supple, symmetrical  Lungs:   clear to auscultation bilaterally,  Respirations unlabored, no coughing  Heart:  regular rate and rhythm and no murmur, Appears well perfused  Extremities: No edema  Skin: Skin color, texture, turgor normal, no rashes or lesions on visualized portions of skin  Neurologic: No gross deficits   The plan was reviewed with the patient/family, and all questions/concerned were addressed.  It was my pleasure to see Dana Salas today and participate in her care. Please feel free to contact me with any questions or concerns.  Sincerely,  Roney Marion, MD Allergy & Immunology  [facility] office: [phone] [facility] office: [phone]

## 2022-09-02 NOTE — Patient Instructions (Signed)
Food allergy Tesing:  - today's skin testing was borderline to milk, negative to all other foods - please strictly avoid mammalian meat, cows milk - okay to resume eating baked milk, all other foods - for SKIN only reaction, okay to take Benadryl 1 capsules every 6 hours - for SKIN + ANY additional symptoms, OR IF concern for LIFE THREATENING reaction = Epipen Autoinjector EpiPen 0.3 mg. - If using Epinephrine autoinjector, call 911 - A food allergy action plan has been provided and discussed. - Medic Alert identification is recommended.  Allergic  Rhinitis moderately well controlled: - allergy testing today was positive grass pollen, weed pollen, tree pollen, molds, dust mite, cat, dog - allergen avoidance as below - consider allergy shots as long term control of your symptoms by teaching your immune system to be more tolerant of your allergy triggers - Continue Nasal Steroid Spray: Options include Flonase (fluticasone), Nasocort (triamcinolone), Nasonex (mometasome) 1- 2 sprays in each nostril daily (can buy over-the-counter if not covered by insurance)  Best results if used daily. - Consider Astelin (Azelastine) 1-2 sprays in each nostril twice a day as needed.  You may use this as needed for nasal congestion/itchy ears/itchy nose if desired - Continue over the counter antihistamine daily or daily as needed.   -Your options include Zyrtec (Cetirizine) '10mg'$ , Claritin (Loratadine) '10mg'$ , Allegra (Fexofenadine) '180mg'$ , or Xyzal (Levocetirinze) '5mg'$   Allergic Conjunctivitis:  - Consider Allergy Eye drops: great options include Pataday (Olopatadine) or Zaditor (ketotifen) for eye symptoms daily as needed-both sold over the counter if not covered by insurance.   -Avoid eye drops that say red eye relief as they may contain medications that dry out your eyes.  Mammalian Meat Allergy (galactose-alpha-1, 3-galactose allergy AKA Alpha Gal): - Strictly avoid all mammalian meat (beef, pork, venison, lamb,  goat, and bison, etc) - You may continue to eat all poultry and seafood - Some patients may start to develop symptoms due to gelatin which is found in many over the counter medications and vaccines - Some patient may go on to develop symptoms to cows milk  - Please keep symptom diary for any new or unexplained reactions  - Always carry your EpiPen with you at all times to be used in case of severe reaction - consider retesting in a few years  Follow up: 6 months or sooner if problems   Thank you so much for letting me partake in your care today.  Don't hesitate to reach out if you have any additional concerns!  Roney Marion, MD  [facility] [place] [place]  Reducing Pollen Exposure  The American Academy of Allergy, Asthma and Immunology suggests the following steps to reduce your exposure to pollen during allergy seasons.    Do not hang sheets or clothing out to dry; pollen may collect on these items. Do not mow lawns or spend time around freshly cut grass; mowing stirs up pollen. Keep windows closed at night.  Keep car windows closed while driving. Minimize morning activities outdoors, a time when pollen counts are usually at their highest. Stay indoors as much as possible when pollen counts or humidity is high and on windy days when pollen tends to remain in the air longer. Use air conditioning when possible.  Many air conditioners have filters that trap the pollen spores. Use a HEPA room air filter to remove pollen form the indoor air you breathe.  Control of Dog or Cat Allergen  Avoidance is the best way to manage  a dog or cat allergy. If you have a dog or cat and are allergic to dog or cats, consider removing the dog or cat from the home. If you have a dog or cat but don't want to find it a new home, or if your family wants a pet even though someone in the household is allergic, here are some strategies that may help keep symptoms at bay:  Keep the pet out of  your bedroom and restrict it to only a few rooms. Be advised that keeping the dog or cat in only one room will not limit the allergens to that room. Don't pet, hug or kiss the dog or cat; if you do, wash your hands with soap and water. High-efficiency particulate air (HEPA) cleaners run continuously in a bedroom or living room can reduce allergen levels over time. Regular use of a high-efficiency vacuum cleaner or a central vacuum can reduce allergen levels. Giving your dog or cat a bath at least once a week can reduce airborne allergen.  Control of Mold Allergen   Mold and fungi can grow on a variety of surfaces provided certain temperature and moisture conditions exist.  Outdoor molds grow on plants, decaying vegetation and soil.  The major outdoor mold, Alternaria and Cladosporium, are found in very high numbers during hot and dry conditions.  Generally, a late Summer - Fall peak is seen for common outdoor fungal spores.  Rain will temporarily lower outdoor mold spore count, but counts rise rapidly when the rainy period ends.  The most important indoor molds are Aspergillus and Penicillium.  Dark, humid and poorly ventilated basements are ideal sites for mold growth.  The next most common sites of mold growth are the bathroom and the kitchen.  Outdoor (Seasonal) Mold Control  Positive outdoor molds via skin testing: Alternaria, Cladosporium, Drechslera (Curvalaria), and Mucor  Use air conditioning and keep windows closed Avoid exposure to decaying vegetation. Avoid leaf raking. Avoid grain handling. Consider wearing a face mask if working in moldy areas.    Indoor (Perennial) Mold Control   Positive indoor molds via skin testing: Aspergillus  Maintain humidity below 50%. Clean washable surfaces with 5% bleach solution. Remove sources e.g. contaminated carpets.

## 2022-09-07 ENCOUNTER — Ambulatory Visit (INDEPENDENT_AMBULATORY_CARE_PROVIDER_SITE_OTHER): Payer: BC Managed Care – PPO | Admitting: Podiatry

## 2022-09-07 ENCOUNTER — Encounter: Payer: Self-pay | Admitting: Podiatry

## 2022-09-07 DIAGNOSIS — M25872 Other specified joint disorders, left ankle and foot: Secondary | ICD-10-CM

## 2022-09-07 NOTE — Progress Notes (Signed)
She presents today for follow-up of her sesamoiditis of her left foot states that is still causing the pain and per my husband squeeze it is still really painful.  Objective: Vital signs stable alert oriented x3.  Pulses are palpable.  There is no erythema edema cellulitis drainage odor pain on palpation to the tibial and fibular sesamoid with plantarflexion of the first metatarsal phalangeal joint there is considerable discomfort.  Still has discomfort through the middle of the joint as well.  Assessment: Capsulitis possibly originating from the sesamoids first metatarsophalangeal joint left foot.  Cannot rule out osteoarthritis or fracture of the sesamoids.  Plan: Discussed etiology pathology conservative therapies at this point were going to request an MRI of the left forefoot to evaluate the tibia and fibular sesamoids.  Discussed differential diagnosis as well as surgical planning.

## 2022-09-08 ENCOUNTER — Ambulatory Visit (INDEPENDENT_AMBULATORY_CARE_PROVIDER_SITE_OTHER): Payer: BC Managed Care – PPO | Admitting: Family Medicine

## 2022-09-08 ENCOUNTER — Encounter: Payer: Self-pay | Admitting: Family Medicine

## 2022-09-08 ENCOUNTER — Encounter: Payer: Self-pay | Admitting: *Deleted

## 2022-09-08 VITALS — BP 124/72 | HR 82 | Temp 97.5°F | Ht 63.25 in | Wt 162.2 lb

## 2022-09-08 DIAGNOSIS — Z91018 Allergy to other foods: Secondary | ICD-10-CM | POA: Diagnosis not present

## 2022-09-08 DIAGNOSIS — H53452 Other localized visual field defect, left eye: Secondary | ICD-10-CM

## 2022-09-08 DIAGNOSIS — H539 Unspecified visual disturbance: Secondary | ICD-10-CM | POA: Insufficient documentation

## 2022-09-08 DIAGNOSIS — R42 Dizziness and giddiness: Secondary | ICD-10-CM

## 2022-09-08 LAB — BASIC METABOLIC PANEL
BUN: 10 mg/dL (ref 6–23)
CO2: 27 mEq/L (ref 19–32)
Calcium: 9.5 mg/dL (ref 8.4–10.5)
Chloride: 103 mEq/L (ref 96–112)
Creatinine, Ser: 0.67 mg/dL (ref 0.40–1.20)
GFR: 102.76 mL/min (ref >60.00)
Glucose, Bld: 84 mg/dL (ref 70–99)
Potassium: 4.2 mEq/L (ref 3.5–5.1)
Sodium: 138 mEq/L (ref 135–145)

## 2022-09-08 LAB — CBC WITH DIFFERENTIAL/PLATELET
Basophils Absolute: 0 10*3/uL (ref 0.0–0.1)
Basophils Relative: 0.8 % (ref 0.0–3.0)
Eosinophils Absolute: 0.1 10*3/uL (ref 0.0–0.7)
Eosinophils Relative: 2.4 % (ref 0.0–5.0)
HCT: 41.8 % (ref 36.0–46.0)
Hemoglobin: 14.1 g/dL (ref 12.0–15.0)
Lymphocytes Relative: 24.4 % (ref 12.0–46.0)
Lymphs Abs: 1.4 10*3/uL (ref 0.7–4.0)
MCHC: 33.7 g/dL (ref 30.0–36.0)
MCV: 94.7 fl (ref 78.0–100.0)
Monocytes Absolute: 0.4 10*3/uL (ref 0.1–1.0)
Monocytes Relative: 6.5 % (ref 3.0–12.0)
Neutro Abs: 3.8 10*3/uL (ref 1.4–7.7)
Neutrophils Relative %: 65.9 % (ref 43.0–77.0)
Platelets: 246 10*3/uL (ref 150.0–400.0)
RBC: 4.41 Mil/uL (ref 3.87–5.11)
RDW: 12.6 % (ref 11.5–15.5)
WBC: 5.8 10*3/uL (ref 4.0–10.5)

## 2022-09-08 LAB — IBC PANEL
Iron: 213 ug/dL — ABNORMAL HIGH (ref 42–145)
Saturation Ratios: 78 % — ABNORMAL HIGH (ref 20.0–50.0)
TIBC: 273 ug/dL (ref 250.0–450.0)
Transferrin: 195 mg/dL — ABNORMAL LOW (ref 212.0–360.0)

## 2022-09-08 LAB — TSH: TSH: 2.14 u[IU]/mL (ref 0.35–5.50)

## 2022-09-08 LAB — FERRITIN: Ferritin: 41.4 ng/mL (ref 10.0–291.0)

## 2022-09-08 NOTE — Patient Instructions (Addendum)
Labs today. I'd like to order head imaging (MRI) - we will be in touch to get this scheduled Schedule eye doctor appointment for recent vision change.  Good to see you today.  Low sugars are anything <70.   Hypoglycemia Hypoglycemia occurs when the level of sugar (glucose) in the blood is too low. Hypoglycemia can happen in people who have or do not have diabetes. It can develop quickly, and it can be a medical emergency. For most people, a blood glucose level below 70 mg/dL (3.9 mmol/L) is considered hypoglycemia. Glucose is a type of sugar that provides the body's main source of energy. Certain hormones (insulin and glucagon) control the level of glucose in the blood. Insulin lowers blood glucose, and glucagon raises blood glucose. Hypoglycemia can result from having too much insulin in the bloodstream, or from not eating enough food that contains glucose. You may also have reactive hypoglycemia, which happens within 4 hours after eating a meal. What are the causes? Hypoglycemia occurs most often in people who have diabetes and may be caused by: Diabetes medicine. Not eating enough, or not eating often enough. Increased physical activity. Drinking alcohol on an empty stomach. If you do not have diabetes, hypoglycemia may be caused by: A tumor in the pancreas. Not eating enough, or not eating for long periods at a time (fasting). A severe infection or illness. Problems after having bariatric surgery. Organ failure, such as kidney or liver failure. Certain medicines. What increases the risk? Hypoglycemia is more likely to develop in people who: Have diabetes and take medicines to lower blood glucose. Abuse alcohol. Have a severe illness. What are the signs or symptoms? Symptoms vary depending on whether the condition is mild, moderate, or severe. Mild hypoglycemia Hunger. Sweating and feeling clammy. Dizziness or feeling light-headed. Sleepiness or restless sleep. Nausea. Increased  heart rate. Headache. Blurry vision. Mood changes, such as irritability or anxiety. Tingling or numbness around the mouth, lips, or tongue. Moderate hypoglycemia Confusion and poor judgment. Behavior changes. Weakness. Irregular heartbeat. A change in coordination. Severe hypoglycemia Severe hypoglycemia is a medical emergency. It can cause: Fainting. Seizures. Loss of consciousness (coma). Death. How is this diagnosed? Hypoglycemia is diagnosed with a blood test to measure your blood glucose level. This blood test is done while you are having symptoms. Your health care provider may also do a physical exam and review your medical history. How is this treated? This condition can be treated by immediately eating or drinking something that contains sugar with 15 grams of fast-acting carbohydrate, such as: 4 oz (120 mL) of fruit juice. 4 oz (120 mL) of regular soda (not diet soda). Several pieces of hard candy. Check food labels to find out how many pieces to eat for 15 grams. 1 Tbsp (15 mL) of sugar or honey. 4 glucose tablets. 1 tube of glucose gel. Treating hypoglycemia if you have diabetes If you are alert and able to swallow safely, follow the 15:15 rule: Take 15 grams of a fast-acting carbohydrate. Talk with your health care provider about how much you should take. Options for getting 15 grams of fast-acting carbohydrate include: Glucose tablets (take 4 tablets). Several pieces of hard candy. Check food labels to find out how many pieces to eat for 15 grams. 4 oz (120 mL) of fruit juice. 4 oz (120 mL) of regular soda (not diet soda). 1 Tbsp (15 mL) of sugar or honey. 1 tube of glucose gel. Check your blood glucose 15 minutes after you take the  carbohydrate. If the repeat blood glucose level is still at or below 70 mg/dL (3.9 mmol/L), take 15 grams of a carbohydrate again. If your blood glucose level does not increase above 70 mg/dL (3.9 mmol/L) after 3 tries, seek emergency  medical care. After your blood glucose level returns to normal, eat a meal or a snack within 1 hour.  Treating severe hypoglycemia Severe hypoglycemia is when your blood glucose level is below 54 mg/dL (3 mmol/L). Severe hypoglycemia is a medical emergency. Get medical help right away. If you have severe hypoglycemia and you cannot eat or drink, you will need to be given glucagon. A family member or close friend should learn how to check your blood glucose and how to give you glucagon. Ask your health care provider if you need to have an emergency glucagon kit available. Severe hypoglycemia may need to be treated in a hospital. The treatment may include getting glucose through an IV. You may also need treatment for the cause of your hypoglycemia. Follow these instructions at home:  General instructions Take over-the-counter and prescription medicines only as told by your health care provider. Monitor your blood glucose as told by your health care provider. If you drink alcohol: Limit how much you have to: 0-1 drink a day for women who are not pregnant. 0-2 drinks a day for men. Know how much alcohol is in your drink. In the [place] one drink equals one 12 oz bottle of beer (355 mL), one 5 oz glass of wine (148 mL), or one 1 oz glass of hard liquor (44 mL). Be sure to eat food along with drinking alcohol. Be aware that alcohol is absorbed quickly and may have lingering effects that may result in hypoglycemia later. Be sure to do ongoing glucose monitoring. Keep all follow-up visits. This is important. If you have diabetes: Always have a fast-acting carbohydrate (15 grams) option with you to treat low blood glucose. Follow your diabetes management plan as directed by your health care provider. Make sure you: Know the symptoms of hypoglycemia. It is important to treat it right away to prevent it from becoming severe. Check your blood glucose as often as told. Always check before and after  exercise. Always check your blood glucose before you drive a motorized vehicle. Take your medicines as told. Follow your meal plan. Eat on time, and do not skip meals. Share your diabetes management plan with people in your workplace, school, and household. Carry a medical alert card or wear medical alert jewelry. Where to find more information American Diabetes Association: www.diabetes.org Contact a health care provider if: You have problems keeping your blood glucose in your target range. You have frequent episodes of hypoglycemia. Get help right away if: You continue to have hypoglycemia symptoms after eating or drinking something that contains 15 grams of fast-acting carbohydrate, and you cannot get your blood glucose above 70 mg/dL (3.9 mmol/L) while following the 15:15 rule. Your blood glucose is below 54 mg/dL (3 mmol/L). You have a seizure. You faint. These symptoms may represent a serious problem that is an emergency. Do not wait to see if the symptoms will go away. Get medical help right away. Call your local emergency services (911 in the [place] Do not drive yourself to the hospital. Summary Hypoglycemia occurs when the level of sugar (glucose) in the blood is too low. Hypoglycemia can happen in people who have or do not have diabetes. It can develop quickly, and it can be a medical emergency. Make  sure you know the symptoms of hypoglycemia and how to treat it. Always have a fast-acting carbohydrate option with you to treat low blood sugar. This information is not intended to replace advice given to you by your health care provider. Make sure you discuss any questions you have with your health care provider. Document Revised: 10/10/2020 Document Reviewed: 10/10/2020 Elsevier Patient Education  [place].

## 2022-09-08 NOTE — Assessment & Plan Note (Signed)
rec check cbg next time she has hypoglycemic symptoms.

## 2022-09-08 NOTE — Assessment & Plan Note (Signed)
High levels on alpha gal test. Saw allergist rec avoid mammalian meats and dairy products at this time.

## 2022-09-08 NOTE — Assessment & Plan Note (Signed)
Update iron panel and CBC, r/o polycythemia

## 2022-09-08 NOTE — Assessment & Plan Note (Signed)
Sudden left sided peripheral vision loss that occurred several weeks ago and lasted 30 min. Symptoms have resolved, no headache associated with this. Possible retinal artery occlusion - she will call and schedule eye exam.  She does have fmhx likely hereditary AVM. Prudent to fully evaluate for stroke, AVM - check MRI brain, MRA head/neck for further evaluation. Check further labwork today.

## 2022-09-08 NOTE — Progress Notes (Signed)
Patient ID: Dana Salas, female    DOB: [birth year], 51 y.o.   MRN: [id number]  This visit was conducted in person.  BP 124/72   Pulse 82   Temp (!) 97.5 F (36.4 C) (Temporal)   Ht 5' 3.25" (1.607 m)   Wt 162 lb 4 oz (73.6 kg)   SpO2 98%   BMI 28.51 kg/m   Orthostatic VS for the past 24 hrs (Last 3 readings):  BP- Lying BP- Standing at 3 minutes  09/08/22 1135 -- 120/80  09/08/22 1132 118/72 --   CC: vision changes, dizziness Subjective:   HPI: Dana Salas is a 51 y.o. female presenting on 09/08/2022 for Dizziness (C/o feeling lightheaded, feeling shaky before eating and some loss of peripheral vision- once.  Sxs started about 4 mos ago. )   Episodes over last few months of lightheadedness. Late September had episode of disorientation/ lightheadedness associated with loss of left inferior peripheral vision (graying out of vision). This happened while sweeping at home and lasted about 30 min. No associated headache, chest pain, palpitations, double vision, nausea, numbness/paresthesias or weakness. Also notes she can get shaky when hungry.   Last eye exam Spring 2023 WNL.  No h/o migraines.   Sister with L cerebral AVM (thought hereditary).  Father with stroke and MI   Tick bite this past summer, complicated by alpha gal allergy. Saw allergist last week, note reviewed. Avoiding all mammalian meats including dairy at this time.   HH - goal ferritin <50. Rec monitoring isolated elevated iron saturation. Last seen 04/2022, seeing Q6 months. Has had 2 phlebotomies in the past year.   Currently perimenopausal with irregular cycles. GYN appt next week.   She's not regularly using ibuprofen or meloxicam.      Relevant past medical, surgical, family and social history reviewed and updated as indicated. Interim medical history since our last visit reviewed. Allergies and medications reviewed and updated. Outpatient Medications Prior to Visit  Medication Sig Dispense  Refill   cetirizine (ZYRTEC) 10 MG tablet Take 10 mg by mouth daily as needed for allergies. Zyrtec D     EPINEPHrine 0.3 mg/0.3 mL IJ SOAJ injection Inject 0.3 mg into the muscle as needed for anaphylaxis. 1 each 0   fluticasone (FLONASE) 50 MCG/ACT nasal spray Place 1 spray into both nostrils daily.      ibuprofen (ADVIL) 200 MG tablet Take 3 tablets (600 mg total) by mouth 2 (two) times daily as needed for headache.     meloxicam (MOBIC) 7.5 MG tablet Take 1 tablet (7.5 mg total) by mouth daily. 30 tablet 0   omeprazole (PRILOSEC) 20 MG capsule TAKE 1 CAPSULE BY MOUTH DAILY 30 capsule 0   No facility-administered medications prior to visit.     Per HPI unless specifically indicated in ROS section below Review of Systems  Objective:  BP 124/72   Pulse 82   Temp (!) 97.5 F (36.4 C) (Temporal)   Ht 5' 3.25" (1.607 m)   Wt 162 lb 4 oz (73.6 kg)   SpO2 98%   BMI 28.51 kg/m   Wt Readings from Last 3 Encounters:  09/08/22 162 lb 4 oz (73.6 kg)  09/02/22 160 lb (72.6 kg)  06/12/22 161 lb 2 oz (73.1 kg)      Physical Exam Vitals and nursing note reviewed.  Constitutional:      Appearance: Normal appearance. She is not ill-appearing.  HENT:     Head: Normocephalic and atraumatic.  Mouth/Throat:     Mouth: Mucous membranes are moist.     Pharynx: Oropharynx is clear. No oropharyngeal exudate or posterior oropharyngeal erythema.  Eyes:     Extraocular Movements: Extraocular movements intact.     Conjunctiva/sclera: Conjunctivae normal.     Pupils: Pupils are equal, round, and reactive to light.  Neck:     Thyroid: No thyroid mass or thyromegaly.     Vascular: No carotid bruit.  Cardiovascular:     Rate and Rhythm: Normal rate and regular rhythm.     Pulses: Normal pulses.     Heart sounds: Normal heart sounds. No murmur heard. Pulmonary:     Effort: Pulmonary effort is normal. No respiratory distress.     Breath sounds: Normal breath sounds. No wheezing, rhonchi or  rales.  Musculoskeletal:     Cervical back: Normal range of motion and neck supple. No rigidity.     Right lower leg: No edema.     Left lower leg: No edema.  Lymphadenopathy:     Cervical: No cervical adenopathy.  Skin:    General: Skin is warm and dry.     Findings: No rash.  Neurological:     General: No focal deficit present.     Mental Status: She is alert.     Cranial Nerves: Cranial nerves 2-12 are intact.     Sensory: Sensation is intact.     Motor: Motor function is intact.     Coordination: Coordination is intact.     Comments:  CN 2-12 intact FTN intact EOMI  Psychiatric:        Mood and Affect: Mood normal.        Behavior: Behavior normal.       Results for orders placed or performed in visit on 06/12/22  Alpha-Gal Panel  Result Value Ref Range   Beef 30.40 (H) kU/L   CLASS 4    Allergen, Mutton, f88 12.00 (H) kU/L   Class 3    Allergen, Pork, f26 12.90 (H) kU/L   CLASS 3    GALACTOSE-ALPHA-1,3-GALACTOSE IGE* >100 (H) <0.10 kU/L  Food Allergy Profile w/ Reflexes  Result Value Ref Range   Egg White IgE 0.13 (H) kU/L   Class 0/1    Peanut IgE 0.23 (H) kU/L   Class 0/1    Wheat IgE 0.18 (H) kU/L   CLASS 0/1    Walnut <0.10 kU/L   CLASS 0    Fish Cod <0.10 kU/L   CLASS 0    Milk IgE 2.78 (H) kU/L   Class 2    Soybean IgE 0.13 (H) kU/L   CLASS 0/1    Shrimp IgE 0.33 (H) kU/L   Class 0/1    Scallop IgE 0.12 (H) kU/L   CLASS 0/1    Sesame Seed f10  0.22 (H) kU/L   CLASS 0/1    Hazelnut 0.11 (H) kU/L   CLASS 0/1    Cashew IgE <0.10 kU/L   CLASS 0    Almonds 0.10 (H) kU/L   CLASS 0/1    Allergen, Salmon, f41 0.12 (H) kU/L   CLASS 0/1    Tuna IgE <0.10 kU/L   CLASS 0   CBC with Differential  Result Value Ref Range   WBC 4.7 4.0 - 10.5 K/uL   RBC 4.25 3.87 - 5.11 Mil/uL   Hemoglobin 13.7 12.0 - 15.0 g/dL   HCT 39.8 36.0 - 46.0 %   MCV 93.7 78.0 - 100.0 fl  MCHC 34.3 30.0 - 36.0 g/dL   RDW 12.1 11.5 - 15.5 %   Platelets 218.0 150.0 -  400.0 K/uL   Neutrophils Relative % 63.9 43.0 - 77.0 %   Lymphocytes Relative 23.8 12.0 - 46.0 %   Monocytes Relative 8.7 3.0 - 12.0 %   Eosinophils Relative 3.0 0.0 - 5.0 %   Basophils Relative 0.6 0.0 - 3.0 %   Neutro Abs 3.0 1.4 - 7.7 K/uL   Lymphs Abs 1.1 0.7 - 4.0 K/uL   Monocytes Absolute 0.4 0.1 - 1.0 K/uL   Eosinophils Absolute 0.1 0.0 - 0.7 K/uL   Basophils Absolute 0.0 0.0 - 0.1 K/uL  B. burgdorfi antibodies by WB  Result Value Ref Range   B burgdorferi IgG Abs (IB) NEGATIVE NEGATIVE   Lyme Disease 18 kD IgG NON-REACTIVE    Lyme Disease 23 kD IgG NON-REACTIVE    Lyme Disease 28 kD IgG NON-REACTIVE    Lyme Disease 30 kD IgG NON-REACTIVE    Lyme Disease 39 kD IgG NON-REACTIVE    Lyme Disease 41 kD IgG NON-REACTIVE    Lyme Disease 45 kD IgG NON-REACTIVE    Lyme Disease 58 kD IgG REACTIVE (A)    Lyme Disease 66 kD IgG NON-REACTIVE    Lyme Disease 93 kD IgG NON-REACTIVE    B burgdorferi IgM Abs (IB) NEGATIVE NEGATIVE   Lyme Disease 23 kD IgM NON-REACTIVE    Lyme Disease 39 kD IgM NON-REACTIVE    Lyme Disease 41 kD IgM REACTIVE (A)   Egg Component Panel Reflex  Result Value Ref Range   Allergen, Ovalbumin, f232 <0.10 kU/L   CLASS 0    Allergen, Ovomucoid, f233 <0.10 kU/L   CLASS 0   Milk Component Panel Reflex  Result Value Ref Range   Allergen, Alpha-lactalb,f76 <0.10 kU/L   CLASS 0    Allergen, Beta-lactoglob,f77 <0.10 kU/L   Class 0    Allergen, Casein, f78 0.12 (H) kU/L   CLASS 0/1   Interpretation:  Result Value Ref Range   Interpretation    Peanut Component Panel Reflex  Result Value Ref Range   Ara h 1 (f422) <0.10 <0.10 kU/L   Ara h 2 (f423) <0.10 <0.10 kU/L   Ara h 3 (f424) <0.10 <0.10 kU/L   F447-IgE Ara h 6 <0.10 <0.10 kU/L   Ara h 8 (f352) <0.10 <0.10 kU/L   Ara h 9 (f427 <0.10 <0.10 kU/L    Assessment & Plan:   Problem List Items Addressed This Visit     Hemochromatosis, hereditary ([place])    Update iron panel and CBC, r/o polycythemia       Relevant Orders   Ferritin   IBC panel   CBC with Differential/Platelet   TSH   Basic metabolic panel   Peripheral vision loss, left - Primary    Sudden left sided peripheral vision loss that occurred several weeks ago and lasted 30 min. Symptoms have resolved, no headache associated with this. Possible retinal artery occlusion - she will call and schedule eye exam.  She does have fmhx likely hereditary AVM. Prudent to fully evaluate for stroke, AVM - check MRI brain, MRA head/neck for further evaluation. Check further labwork today.       Relevant Orders   MR Brain Wo Contrast   MR ANGIO HEAD WO CONTRAST   MR ANGIO NECK WO CONTRAST   Intermittent lightheadedness    rec check cbg next time she has hypoglycemic symptoms.  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 Saw allergist rec avoid mammalian meats and dairy products at this time.         No orders of the defined types were placed in this encounter.  Orders Placed This Encounter  Procedures   MR Brain Wo Contrast    Standing Status:   Future    Standing Expiration Date:   09/09/2023    Order Specific Question:   What is the patient's sedation requirement?    Answer:   No Sedation    Order Specific Question:   Does the patient have a pacemaker or implanted devices?    Answer:   No    Order Specific Question:   Preferred imaging location?    Answer:   [facility] (table limit - 550lbs)   MR ANGIO HEAD WO CONTRAST    Standing Status:   Future    Standing Expiration Date:   09/09/2023    Order Specific Question:   What is the patient's sedation requirement?    Answer:   No Sedation    Order Specific Question:   Does the patient have a pacemaker or implanted devices?    Answer:   No    Order Specific Question:   Preferred imaging location?    Answer:   [facility] (table  limit - 550lbs)   MR ANGIO NECK WO CONTRAST    Standing Status:   Future    Standing Expiration Date:   09/09/2023    Order Specific Question:   GRA to provide read?    Answer:   Yes    Order Specific Question:   What is the patient's sedation requirement?    Answer:   No Sedation    Order Specific Question:   Does the patient have a pacemaker or implanted devices?    Answer:   No    Order Specific Question:   Preferred imaging location?    Answer:   [facility] (table limit - 550lbs)   Ferritin   IBC panel   CBC with Differential/Platelet   TSH   Basic metabolic panel     Patient instructions: Labs today. I'd like to order head imaging (MRI) - we will be in touch to get this scheduled Schedule eye doctor appointment for recent vision change.  Good to see you today.  Low sugars are anything <70.   Follow up plan: No follow-ups on file.  Ria Bush, MD

## 2022-09-14 ENCOUNTER — Encounter: Payer: Self-pay | Admitting: Obstetrics and Gynecology

## 2022-09-14 NOTE — Progress Notes (Signed)
GYNECOLOGY ANNUAL PHYSICAL EXAM PROGRESS NOTE  Subjective:    Dana Salas is a 51 y.o. G18P3003 female who presents for an annual exam. The patient has no GYN complaints today. She does note that her cycles are still irregular at this time, but knows that this is normal as she is transitioning to into menopause. The patient is sexually active. The patient participates in regular exercise: yes. Has the patient ever been transfused or tattooed?: no. The patient reports that there is not domestic violence in her life.   She does report that she has been dealing with symptoms/issues that she thinks may be related to her Alpha Gal allergy. Has had 2 episodes of temporary peripheral vision loss bilaterally (typically lasts ~ 10 min). Has addressed with her PCP, is scheduled for an MRI at the end of the week.  Of interest, she does tend to note these episodes were also associated with the onset of her menstrual cycle.  Also had feelings like she had a "head cold" with the onset of certain symptoms. Has had some labwork done, notes increase in iron levels.    Menstrual History: Menarche age: 72 No LMP recorded. Patient is perimenopausal.   Period Pattern: (!) Irregular Menstrual Flow: Light, Moderate, Heavy Menstrual Control: Maxi pad, Hospital pad Menstrual Control Change Freq (Hours): 8 Dysmenorrhea: (!) Severe Dysmenorrhea Symptoms: Cramping, Headache    Gynecologic History:  Contraception: vasectomy History of STI's: Denies Last Pap: 10/11/2020. Results were: normal.  Denies h/o abnormal pap smears. Last mammogram: 11/12/2021. Results were: normal   OB History  Gravida Para Term Preterm AB Living  '3 3 3 '$ 0 0 3  SAB IAB Ectopic Multiple Live Births  0 0 0 0 3    # Outcome Date GA Lbr Len/2nd Weight Sex Delivery Anes PTL Lv  3 Term 2002   6 lb 10 oz (3.005 kg) M Vag-Spont   LIV  2 Term 2000   6 lb 13 oz (3.09 kg) M CS-Vac   LIV  1 Term 1998   7 lb 11 oz (3.487 kg) M Vag-Spont    LIV    Past Medical History:  Diagnosis Date   Boil, labium 33/2016   Hemochromatosis, hereditary ([place]) 09/02/2021   DNA PCR test 08/2021 - positive for 2 copies of HFE gene pathogenic variant: C282Y/C282Y (HOMOZYGOTE)   Other abnormal blood chemistry    Iron,serum, elevated   Pneumonia 11/03   outpatient    Past Surgical History:  Procedure Laterality Date   COLONOSCOPY WITH PROPOFOL N/A 04/15/2020   TA, rpt 5 yrs (Tahiliani, Lennette Bihari, MD)    Family History  Problem Relation Age of Onset   Dementia Mother    High Cholesterol Mother    Hypertension Father    COPD Father        smoker   Heart attack Father    High Cholesterol Father    Stroke Father    Thyroid disease Father    Hypertension Sister    High Cholesterol Sister    Allergic rhinitis Sister    Asthma Sister    AVM Sister        with bleed (at [facility])   Hypertension Brother    High Cholesterol Brother    Cancer Maternal Grandmother        unsure type   Breast cancer Paternal Grandmother    Early death Paternal Grandfather 79       massive MI   Heart attack Paternal  Grandfather    Eczema Neg Hx    Immunodeficiency Neg Hx    Urticaria Neg Hx     Social History   Socioeconomic History   Marital status: Married    Spouse name: Not on file   Number of children: 3   Years of education: Not on file   Highest education level: Not on file  Occupational History   Occupation: Programme researcher, broadcasting/film/video at [employer]   Smoking status: Never    Passive exposure: Never   Smokeless tobacco: Never  Vaping Use   Vaping Use: Never used  Substance and Sexual Activity   Alcohol use: Not Currently    Alcohol/week: 1.0 standard drink of alcohol    Types: 1 Glasses of wine per week    Comment: occasionally   Drug use: No   Sexual activity: Yes    Birth control/protection: None, Surgical    Comment: vastcotomy  Other Topics Concern   Not on file  Social History Narrative   Not on file   Social  Determinants of Health   Financial Resource Strain: Not on file  Food Insecurity: Not on file  Transportation Needs: Not on file  Physical Activity: Not on file  Stress: Not on file  Social Connections: Not on file  Intimate Partner Violence: Not on file    Current Outpatient Medications on File Prior to Visit  Medication Sig Dispense Refill   cetirizine (ZYRTEC) 10 MG tablet Take 10 mg by mouth daily as needed for allergies. Zyrtec D     EPINEPHrine 0.3 mg/0.3 mL IJ SOAJ injection Inject 0.3 mg into the muscle as needed for anaphylaxis. 1 each 0   fluticasone (FLONASE) 50 MCG/ACT nasal spray Place 1 spray into both nostrils daily.      ibuprofen (ADVIL) 200 MG tablet Take 3 tablets (600 mg total) by mouth 2 (two) times daily as needed for headache.     meloxicam (MOBIC) 7.5 MG tablet Take 1 tablet (7.5 mg total) by mouth daily. 30 tablet 0   omeprazole (PRILOSEC) 20 MG capsule TAKE 1 CAPSULE BY MOUTH DAILY 30 capsule 0   No current facility-administered medications on file prior to visit.    Allergies  Allergen Reactions   Alpha-Gal Diarrhea, Hives and Nausea Only     Review of Systems Constitutional: negative for chills, fatigue, fevers and sweats Eyes: negative for irritation, redness and visual disturbance Ears, nose, mouth, throat, and face: negative for hearing loss, nasal congestion, snoring and tinnitus Respiratory: negative for asthma, cough, sputum Cardiovascular: negative for chest pain, dyspnea, exertional chest pressure/discomfort, irregular heart beat, palpitations and syncope Gastrointestinal: negative for abdominal pain, change in bowel habits, nausea and vomiting Genitourinary: negative for abnormal menstrual periods, genital lesions, sexual problems and vaginal discharge, dysuria and urinary incontinence Integument/breast: negative for breast lump, breast tenderness and nipple discharge Hematologic/lymphatic: negative for bleeding and easy  bruising Musculoskeletal:negative for back pain and muscle weakness Neurological: negative for dizziness, headaches, vertigo and weakness. Positive for temporal peripheral vision loss.  Endocrine: negative for diabetic symptoms including polydipsia, polyuria and skin dryness Allergic/Immunologic: negative for hay fever and urticaria      Objective:  Blood pressure 125/75, pulse 74, resp. rate 16, height '5\' 3"'$  (1.6 m), weight 162 lb 9.6 oz (73.8 kg).  Body mass index is 28.8 kg/m.  General Appearance:    Alert, cooperative, no distress, appears stated age, overweight.   Head:    Normocephalic, without obvious abnormality, atraumatic  Eyes:  PERRL, conjunctiva/corneas clear, EOM's intact, both eyes  Ears:    Normal external ear canals, both ears  Nose:   Nares normal, septum midline, mucosa normal, no drainage or sinus tenderness  Throat:   Lips, mucosa, and tongue normal; teeth and gums normal  Neck:   Supple, symmetrical, trachea midline, no adenopathy; thyroid: no enlargement/tenderness/nodules; no carotid bruit or JVD  Back:     Symmetric, no curvature, ROM normal, no CVA tenderness  Lungs:     Clear to auscultation bilaterally, respirations unlabored  Chest Wall:    No tenderness or deformity   Heart:    Regular rate and rhythm, S1 and S2 normal, no murmur, rub or gallop  Breast Exam:    No tenderness, masses, or nipple abnormality  Abdomen:     Soft, non-tender, bowel sounds active all four quadrants, no masses, no organomegaly.    Genitalia:    Pelvic:external genitalia normal, vagina without lesions, discharge, or tenderness, rectovaginal septum  normal. Cervix normal in appearance, no cervical motion tenderness, no adnexal masses or tenderness.  Uterus normal size, shape, mobile, regular contours, nontender.  Rectal:    Normal external sphincter.  No hemorrhoids appreciated. Internal exam not done.   Extremities:   Extremities normal, atraumatic, no cyanosis or edema  Pulses:   2+  and symmetric all extremities  Skin:   Skin color, texture, turgor normal, no rashes or lesions  Lymph nodes:   Cervical, supraclavicular, and axillary nodes normal  Neurologic:   CNII-XII intact, normal strength, sensation and reflexes throughout   .  Labs:  Lab Results  Component Value Date   WBC 5.8 09/08/2022   HGB 14.1 09/08/2022   HCT 41.8 09/08/2022   MCV 94.7 09/08/2022   PLT 246.0 09/08/2022    Lab Results  Component Value Date   CREATININE 0.67 09/08/2022   BUN 10 09/08/2022   NA 138 09/08/2022   K 4.2 09/08/2022   CL 103 09/08/2022   CO2 27 09/08/2022    Lab Results  Component Value Date   ALT 14 04/22/2022   AST 19 04/22/2022   ALKPHOS 57 04/22/2022   BILITOT 0.3 04/22/2022    Lab Results  Component Value Date   TSH 2.14 09/08/2022    Assessment:   1. Encounter for well woman exam with routine gynecological exam   2. Screen for STD (sexually transmitted disease)   3. Encounter for screening mammogram for malignant neoplasm of breast   4. Need for Tdap vaccination   5. Perimenopausal      Plan:  - Blood tests: UTD. - Breast self exam technique reviewed and patient encouraged to perform self-exam monthly.  - Contraception: vasectomy. - Discussed healthy lifestyle modifications. - Mammogram ordered - Pap smear  UTD . - COVID vaccination status: Declines - Flu Vaccine: Declined - Patient overdue for Tdap, administered today.  - Continue f/u with PCP regarding noted HPI symptoms.  - Likely perimenopausal. Discussed red flag symptoms for menstrual cycles.  - Follow up in 1 year for annual exam   Rubie Maid, MD [place]

## 2022-09-15 ENCOUNTER — Ambulatory Visit (INDEPENDENT_AMBULATORY_CARE_PROVIDER_SITE_OTHER): Payer: BC Managed Care – PPO | Admitting: Obstetrics and Gynecology

## 2022-09-15 ENCOUNTER — Encounter: Payer: Self-pay | Admitting: Obstetrics and Gynecology

## 2022-09-15 VITALS — BP 125/75 | HR 74 | Resp 16 | Ht 63.0 in | Wt 162.6 lb

## 2022-09-15 DIAGNOSIS — N951 Menopausal and female climacteric states: Secondary | ICD-10-CM | POA: Diagnosis not present

## 2022-09-15 DIAGNOSIS — Z01419 Encounter for gynecological examination (general) (routine) without abnormal findings: Secondary | ICD-10-CM

## 2022-09-15 DIAGNOSIS — Z23 Encounter for immunization: Secondary | ICD-10-CM

## 2022-09-15 DIAGNOSIS — Z113 Encounter for screening for infections with a predominantly sexual mode of transmission: Secondary | ICD-10-CM

## 2022-09-15 DIAGNOSIS — Z1231 Encounter for screening mammogram for malignant neoplasm of breast: Secondary | ICD-10-CM

## 2022-09-15 NOTE — Patient Instructions (Signed)

## 2022-09-17 DIAGNOSIS — M9903 Segmental and somatic dysfunction of lumbar region: Secondary | ICD-10-CM | POA: Diagnosis not present

## 2022-09-17 DIAGNOSIS — M5136 Other intervertebral disc degeneration, lumbar region: Secondary | ICD-10-CM | POA: Diagnosis not present

## 2022-09-17 DIAGNOSIS — M5416 Radiculopathy, lumbar region: Secondary | ICD-10-CM | POA: Diagnosis not present

## 2022-09-17 DIAGNOSIS — M62830 Muscle spasm of back: Secondary | ICD-10-CM | POA: Diagnosis not present

## 2022-09-18 ENCOUNTER — Ambulatory Visit
Admission: RE | Admit: 2022-09-18 | Discharge: 2022-09-18 | Disposition: A | Discharge status: Home / Self Care | Payer: BC Managed Care – PPO | Source: Ambulatory Visit | Attending: Family Medicine | Admitting: Family Medicine

## 2022-09-18 DIAGNOSIS — R42 Dizziness and giddiness: Secondary | ICD-10-CM | POA: Insufficient documentation

## 2022-09-18 DIAGNOSIS — H53452 Other localized visual field defect, left eye: Secondary | ICD-10-CM | POA: Insufficient documentation

## 2022-09-18 DIAGNOSIS — G9389 Other specified disorders of brain: Secondary | ICD-10-CM | POA: Diagnosis not present

## 2022-09-21 ENCOUNTER — Ambulatory Visit
Admission: RE | Admit: 2022-09-21 | Discharge: 2022-09-21 | Disposition: A | Discharge status: Home / Self Care | Payer: BC Managed Care – PPO | Source: Ambulatory Visit | Attending: Podiatry | Admitting: Podiatry

## 2022-09-21 DIAGNOSIS — R600 Localized edema: Secondary | ICD-10-CM | POA: Diagnosis not present

## 2022-09-21 DIAGNOSIS — M67472 Ganglion, left ankle and foot: Secondary | ICD-10-CM | POA: Diagnosis not present

## 2022-09-21 DIAGNOSIS — M25872 Other specified joint disorders, left ankle and foot: Secondary | ICD-10-CM

## 2022-09-23 DIAGNOSIS — H5310 Unspecified subjective visual disturbances: Secondary | ICD-10-CM | POA: Diagnosis not present

## 2022-09-24 ENCOUNTER — Encounter: Payer: Self-pay | Admitting: Family Medicine

## 2022-09-24 DIAGNOSIS — H53452 Other localized visual field defect, left eye: Secondary | ICD-10-CM

## 2022-09-24 DIAGNOSIS — E348 Other specified endocrine disorders: Secondary | ICD-10-CM

## 2022-09-28 ENCOUNTER — Encounter: Payer: Self-pay | Admitting: Oncology

## 2022-09-28 ENCOUNTER — Encounter: Payer: Self-pay | Admitting: Podiatry

## 2022-09-29 NOTE — Telephone Encounter (Signed)
Dr. Tasia Catchings, please advise if pt needs additional phelb prior to visit in Dec. Lab results in care everywhere

## 2022-10-12 ENCOUNTER — Ambulatory Visit (INDEPENDENT_AMBULATORY_CARE_PROVIDER_SITE_OTHER): Payer: BC Managed Care – PPO | Admitting: Podiatry

## 2022-10-12 DIAGNOSIS — M25872 Other specified joint disorders, left ankle and foot: Secondary | ICD-10-CM

## 2022-10-12 NOTE — Progress Notes (Signed)
She presents today for follow-up of her MRI of her left foot.  She states that it seems to be doing a little better.  Objective: Vital signs are stable alert oriented x3.  Pulses are palpable.  She has of some tenderness on palpation of the tibial sesamoid left foot.  MRI states that she has some osteoarthritic changes to the plantar medial aspect of the head of the first metatarsal as well as the tibial sesamoid.  Assessment: We discussed the arthritic findings of the first metatarsophalangeal joint.  Plan: After discussion she states that she we will continue her current therapies and will follow-up with me on an as-needed basis.  Should this get worse she will notify us immediately.

## 2022-10-13 ENCOUNTER — Encounter: Payer: Self-pay | Admitting: *Deleted

## 2022-10-13 DIAGNOSIS — E348 Other specified endocrine disorders: Secondary | ICD-10-CM | POA: Insufficient documentation

## 2022-10-13 MED ORDER — FLUCONAZOLE 150 MG PO TABS: 150.0000 mg | ORAL_TABLET | Freq: Once | ORAL | 0 refills | Status: AC

## 2022-10-13 MED ORDER — VALACYCLOVIR HCL 1 G PO TABS: 2000.0000 mg | ORAL_TABLET | Freq: Two times a day (BID) | ORAL | 1 refills | Status: AC

## 2022-10-13 NOTE — Addendum Note (Signed)
Addended by: Ria Bush on: 10/13/2022 09:22 AM   Modules accepted: Orders

## 2022-10-20 DIAGNOSIS — M5416 Radiculopathy, lumbar region: Secondary | ICD-10-CM | POA: Diagnosis not present

## 2022-10-20 DIAGNOSIS — M9903 Segmental and somatic dysfunction of lumbar region: Secondary | ICD-10-CM | POA: Diagnosis not present

## 2022-10-20 DIAGNOSIS — M5136 Other intervertebral disc degeneration, lumbar region: Secondary | ICD-10-CM | POA: Diagnosis not present

## 2022-10-20 DIAGNOSIS — M62830 Muscle spasm of back: Secondary | ICD-10-CM | POA: Diagnosis not present

## 2022-10-23 ENCOUNTER — Inpatient Hospital Stay: Payer: BC Managed Care – PPO | Attending: Oncology

## 2022-10-23 DIAGNOSIS — R7989 Other specified abnormal findings of blood chemistry: Secondary | ICD-10-CM | POA: Insufficient documentation

## 2022-10-23 DIAGNOSIS — Z823 Family history of stroke: Secondary | ICD-10-CM | POA: Diagnosis not present

## 2022-10-23 DIAGNOSIS — Z8349 Family history of other endocrine, nutritional and metabolic diseases: Secondary | ICD-10-CM | POA: Insufficient documentation

## 2022-10-23 DIAGNOSIS — Z8249 Family history of ischemic heart disease and other diseases of the circulatory system: Secondary | ICD-10-CM | POA: Insufficient documentation

## 2022-10-23 DIAGNOSIS — M2550 Pain in unspecified joint: Secondary | ICD-10-CM | POA: Insufficient documentation

## 2022-10-23 DIAGNOSIS — Z803 Family history of malignant neoplasm of breast: Secondary | ICD-10-CM | POA: Insufficient documentation

## 2022-10-23 DIAGNOSIS — Z79899 Other long term (current) drug therapy: Secondary | ICD-10-CM | POA: Insufficient documentation

## 2022-10-23 DIAGNOSIS — Z825 Family history of asthma and other chronic lower respiratory diseases: Secondary | ICD-10-CM | POA: Insufficient documentation

## 2022-10-23 DIAGNOSIS — R5383 Other fatigue: Secondary | ICD-10-CM | POA: Diagnosis not present

## 2022-10-23 DIAGNOSIS — Z809 Family history of malignant neoplasm, unspecified: Secondary | ICD-10-CM | POA: Diagnosis not present

## 2022-10-23 DIAGNOSIS — Z818 Family history of other mental and behavioral disorders: Secondary | ICD-10-CM | POA: Insufficient documentation

## 2022-10-23 LAB — COMPREHENSIVE METABOLIC PANEL
ALT: 18 U/L (ref 0–44)
AST: 24 U/L (ref 15–41)
Albumin: 4.3 g/dL (ref 3.5–5.0)
Alkaline Phosphatase: 68 U/L (ref 38–126)
Anion gap: 10 (ref 5–15)
BUN: 13 mg/dL (ref 6–20)
CO2: 27 mmol/L (ref 22–32)
Calcium: 9.1 mg/dL (ref 8.9–10.3)
Chloride: 101 mmol/L (ref 98–111)
Creatinine, Ser: 0.73 mg/dL (ref 0.44–1.00)
GFR, Estimated: >60 mL/min (ref >60)
Glucose, Bld: 81 mg/dL (ref 70–99)
Potassium: 3.8 mmol/L (ref 3.5–5.1)
Sodium: 138 mmol/L (ref 135–145)
Total Bilirubin: 0.5 mg/dL (ref 0.3–1.2)
Total Protein: 7.2 g/dL (ref 6.5–8.1)

## 2022-10-23 LAB — CBC WITH DIFFERENTIAL/PLATELET
Abs Immature Granulocytes: 0.02 10*3/uL (ref 0.00–0.07)
Basophils Absolute: 0 10*3/uL (ref 0.0–0.1)
Basophils Relative: 1 %
Eosinophils Absolute: 0.1 10*3/uL (ref 0.0–0.5)
Eosinophils Relative: 3 %
HCT: 40.3 % (ref 36.0–46.0)
Hemoglobin: 13.7 g/dL (ref 12.0–15.0)
Immature Granulocytes: 0 %
Lymphocytes Relative: 23 %
Lymphs Abs: 1.2 10*3/uL (ref 0.7–4.0)
MCH: 32.6 pg (ref 26.0–34.0)
MCHC: 34 g/dL (ref 30.0–36.0)
MCV: 96 fL (ref 80.0–100.0)
Monocytes Absolute: 0.4 10*3/uL (ref 0.1–1.0)
Monocytes Relative: 8 %
Neutro Abs: 3.5 10*3/uL (ref 1.7–7.7)
Neutrophils Relative %: 65 %
Platelets: 232 10*3/uL (ref 150–400)
RBC: 4.2 MIL/uL (ref 3.87–5.11)
RDW: 12.3 % (ref 11.5–15.5)
WBC: 5.3 10*3/uL (ref 4.0–10.5)
nRBC: 0 % (ref 0.0–0.2)

## 2022-10-23 LAB — IRON AND TIBC
Iron: 121 ug/dL (ref 28–170)
Saturation Ratios: 37 % — ABNORMAL HIGH (ref 10.4–31.8)
TIBC: 328 ug/dL (ref 250–450)
UIBC: 207 ug/dL

## 2022-10-23 LAB — FERRITIN: Ferritin: 15 ng/mL (ref 11–307)

## 2022-10-23 LAB — VITAMIN B12: Vitamin B-12: 373 pg/mL (ref 180–914)

## 2022-10-25 IMAGING — MG MM DIGITAL SCREENING BILAT W/ TOMO AND CAD
8 series · 8 of 24 positions shown · non-contrast
Comparison: Previous exam(s).

CLINICAL DATA: Screening.

EXAM:
DIGITAL SCREENING BILATERAL MAMMOGRAM WITH TOMOSYNTHESIS AND CAD
TECHNIQUE: Bilateral screening digital craniocaudal and mediolateral oblique
mammograms were obtained. Bilateral screening digital breast
tomosynthesis was performed. The images were evaluated with
computer-aided detection.

[L CC synth-2D]
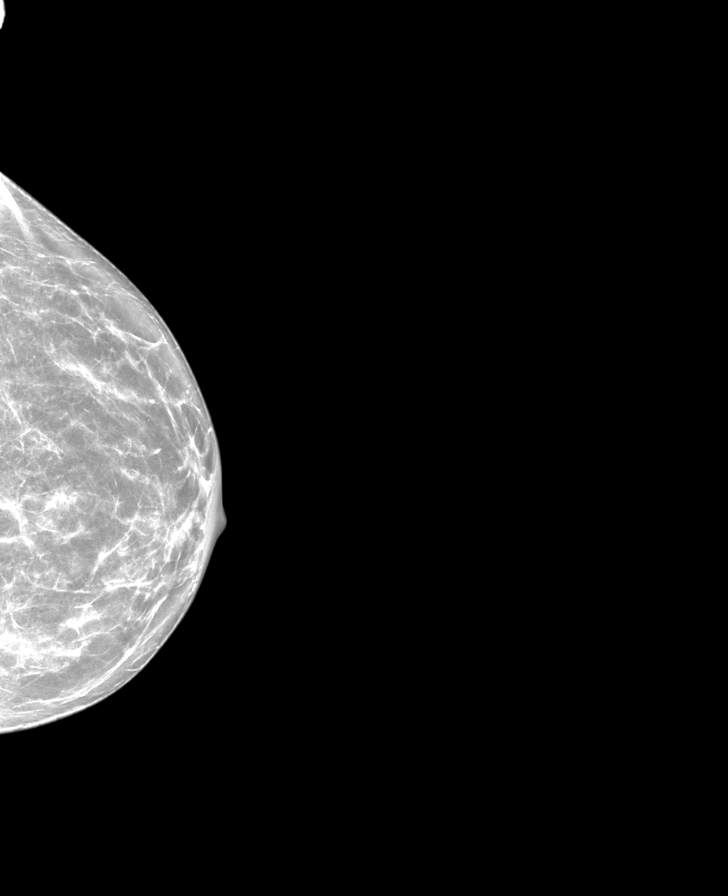

[R CC synth-2D]
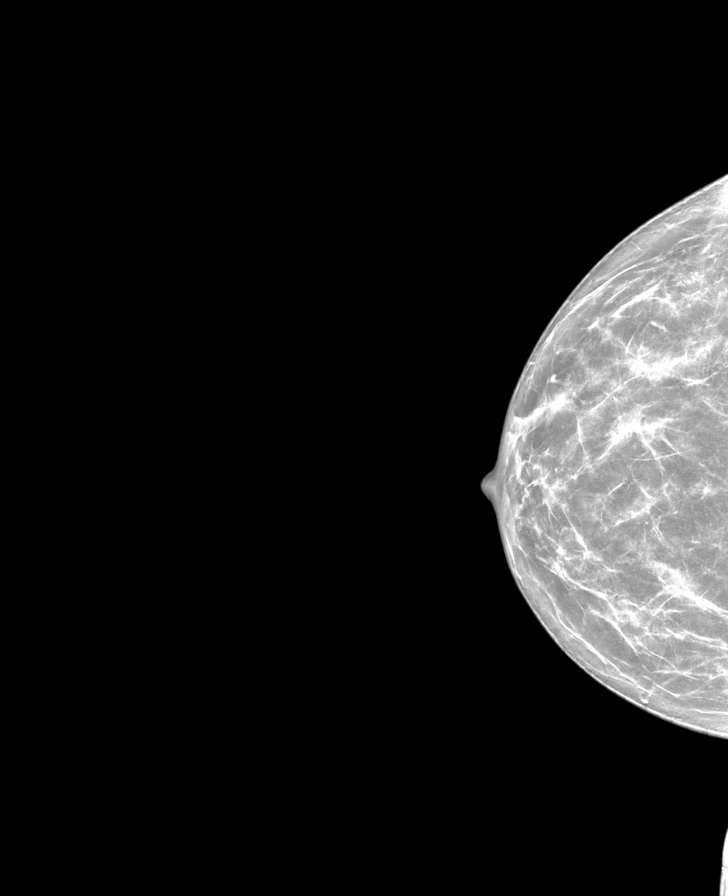

[R MLO synth-2D]
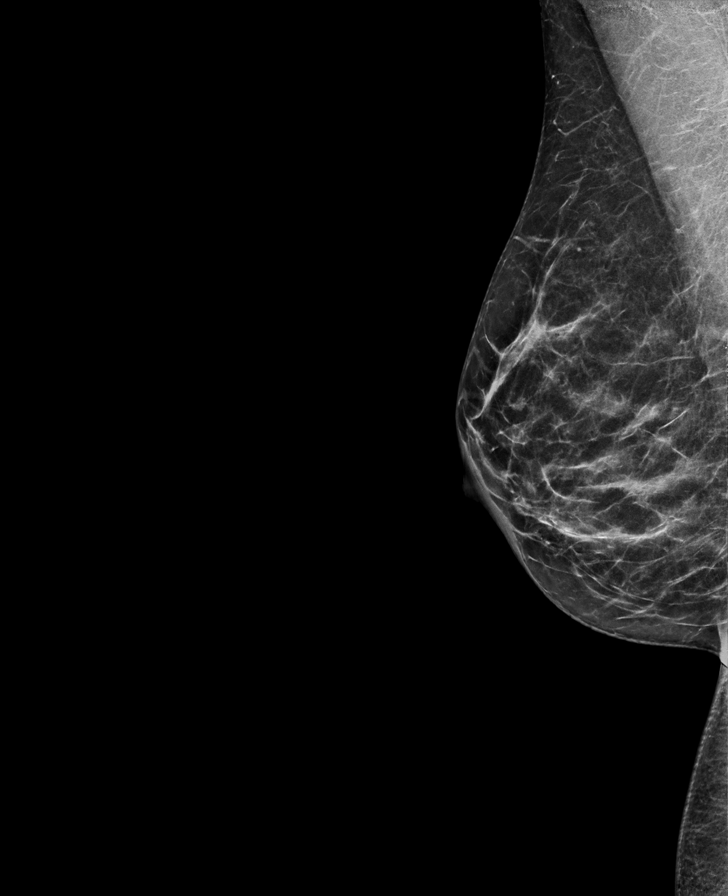

[L MLO synth-2D]
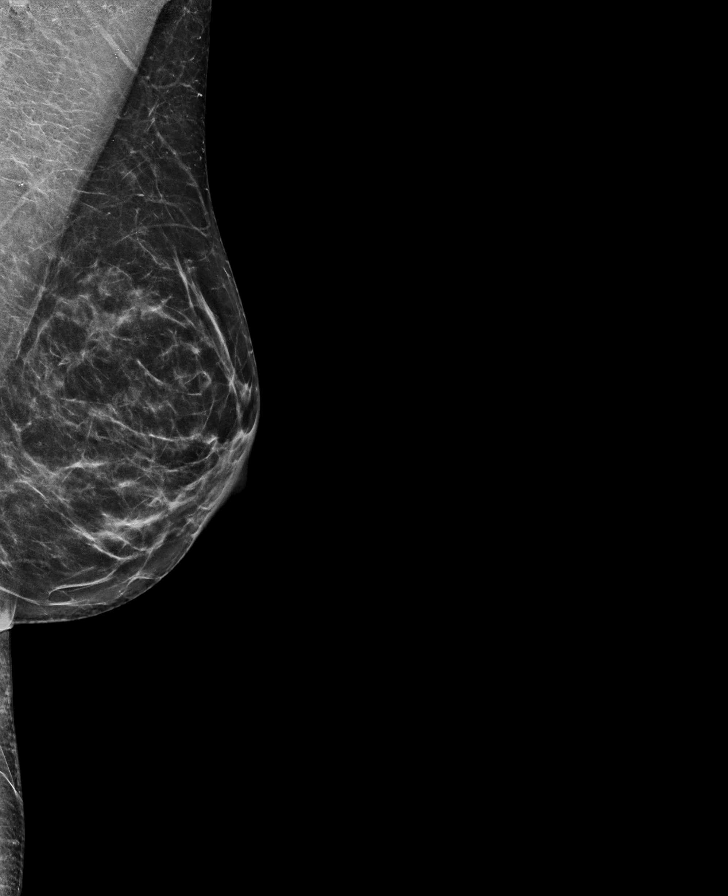

[R MLO tomo · tomo slice 31/60.0]
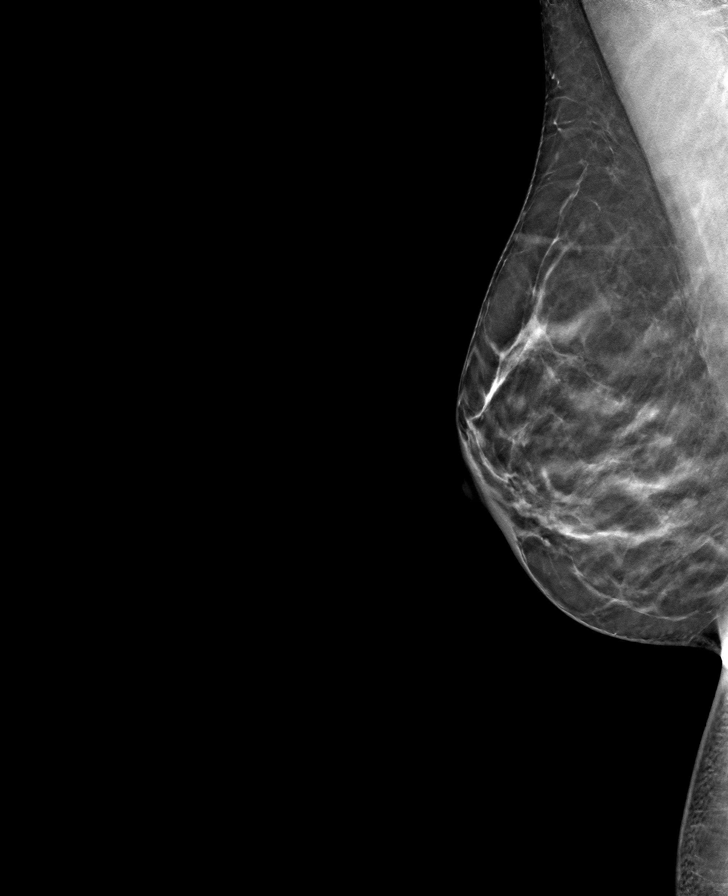

[R CC tomo · tomo slice 33/66.0]
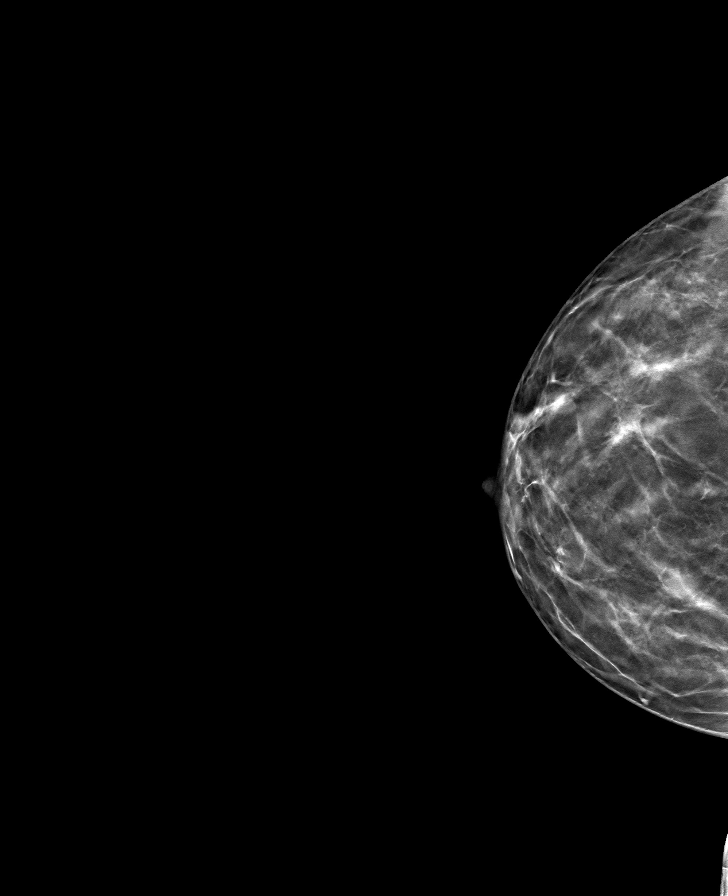

[L MLO tomo · tomo slice 34/67.0]
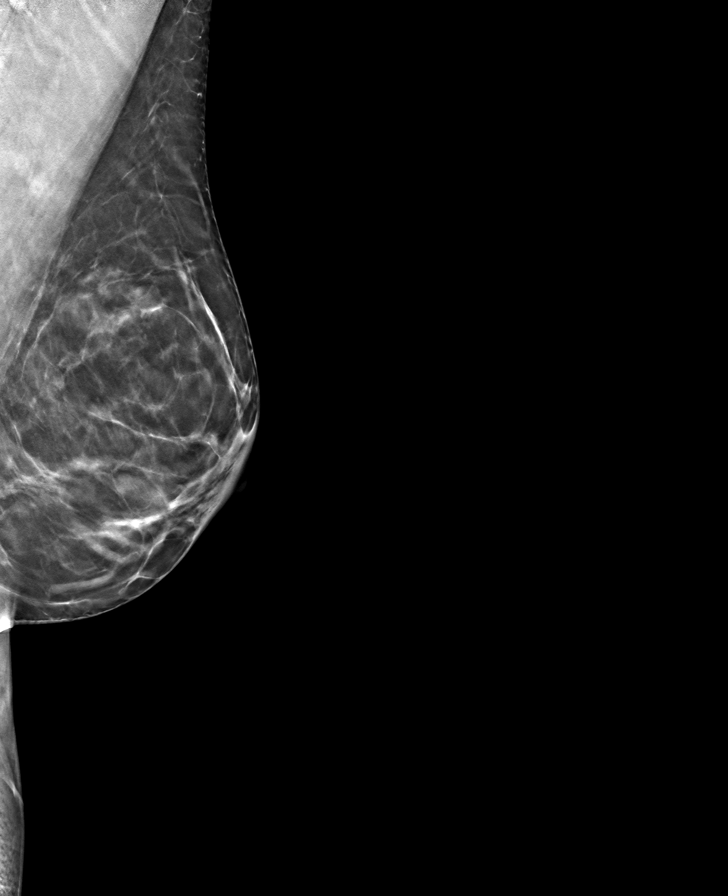

[L CC tomo · tomo slice 35/69.0]
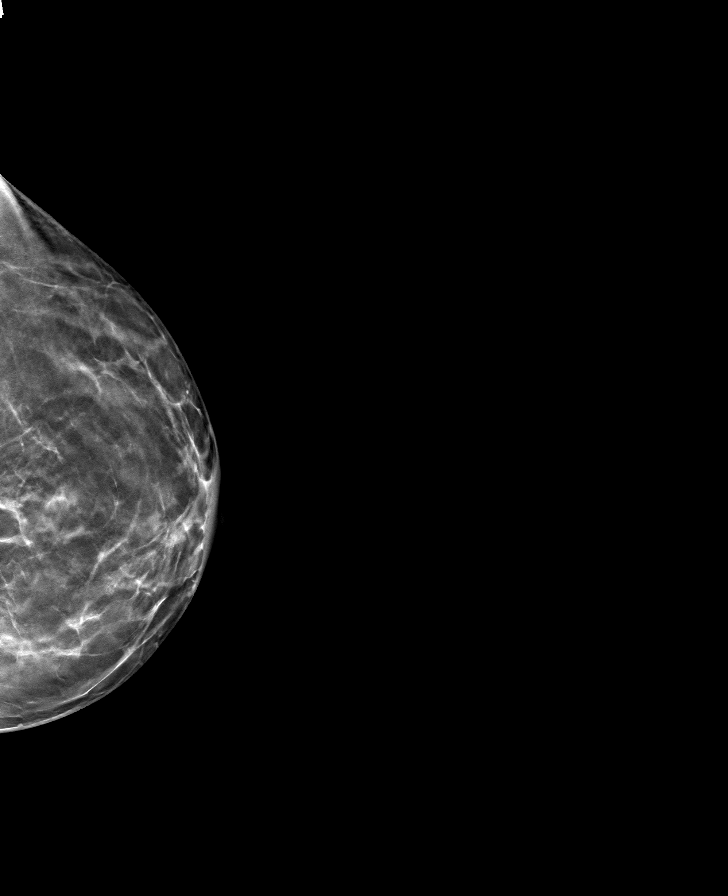

[8 of 24 positions shown; findings below may reference images not displayed]

ACR Breast Density Category b: There are scattered areas of
fibroglandular density.
FINDINGS: There are no findings suspicious for malignancy.
IMPRESSION: No mammographic evidence of malignancy. A result letter of this
screening mammogram will be mailed directly to the patient.

RECOMMENDATION:
Screening mammogram in one year. (Code:51-O-LD2)

BI-RADS CATEGORY  1: Negative.

## 2022-10-26 ENCOUNTER — Inpatient Hospital Stay (HOSPITAL_BASED_OUTPATIENT_CLINIC_OR_DEPARTMENT_OTHER): Payer: BC Managed Care – PPO | Admitting: Oncology

## 2022-10-26 ENCOUNTER — Encounter: Payer: Self-pay | Admitting: Oncology

## 2022-10-26 ENCOUNTER — Telehealth: Payer: Self-pay

## 2022-10-26 DIAGNOSIS — Z8249 Family history of ischemic heart disease and other diseases of the circulatory system: Secondary | ICD-10-CM | POA: Diagnosis not present

## 2022-10-26 DIAGNOSIS — R5383 Other fatigue: Secondary | ICD-10-CM | POA: Diagnosis not present

## 2022-10-26 DIAGNOSIS — R7989 Other specified abnormal findings of blood chemistry: Secondary | ICD-10-CM | POA: Diagnosis not present

## 2022-10-26 DIAGNOSIS — Z809 Family history of malignant neoplasm, unspecified: Secondary | ICD-10-CM | POA: Diagnosis not present

## 2022-10-26 DIAGNOSIS — Z8349 Family history of other endocrine, nutritional and metabolic diseases: Secondary | ICD-10-CM | POA: Diagnosis not present

## 2022-10-26 DIAGNOSIS — Z818 Family history of other mental and behavioral disorders: Secondary | ICD-10-CM | POA: Diagnosis not present

## 2022-10-26 DIAGNOSIS — Z803 Family history of malignant neoplasm of breast: Secondary | ICD-10-CM | POA: Diagnosis not present

## 2022-10-26 DIAGNOSIS — Z825 Family history of asthma and other chronic lower respiratory diseases: Secondary | ICD-10-CM | POA: Diagnosis not present

## 2022-10-26 DIAGNOSIS — M2550 Pain in unspecified joint: Secondary | ICD-10-CM | POA: Diagnosis not present

## 2022-10-26 DIAGNOSIS — Z823 Family history of stroke: Secondary | ICD-10-CM | POA: Diagnosis not present

## 2022-10-26 DIAGNOSIS — Z79899 Other long term (current) drug therapy: Secondary | ICD-10-CM | POA: Diagnosis not present

## 2022-10-26 MED ORDER — FLUCONAZOLE 100 MG PO TABS: ORAL_TABLET | ORAL | 0 refills | Status: DC

## 2022-10-26 NOTE — Assessment & Plan Note (Signed)
#  Hereditary hemochromatosis,C282Y/C282Y homozygous. Labs are reviewed and discussed with Ferritin is at the goal, less than 50. No need for phlebotomy.  Avoid alcohol and iron supplementation

## 2022-10-26 NOTE — Progress Notes (Signed)
Hematology/Oncology Progress note [phone] [phone]      Patient Care Team: Ria Bush, MD as PCP - General  ASSESSMENT & PLAN:   Hemochromatosis, hereditary [facility]) #Hereditary hemochromatosis,C282Y/C282Y homozygous. Labs are reviewed and discussed with Ferritin is at the goal, less than 50. No need for phlebotomy.  Avoid alcohol and iron supplementation   Orders Placed This Encounter  Procedures   CBC with Differential/Platelet    Standing Status:   Future    Standing Expiration Date:   10/27/2023   Comprehensive metabolic panel    Standing Status:   Future    Standing Expiration Date:   10/26/2023   Iron and TIBC    Standing Status:   Future    Standing Expiration Date:   10/27/2023   Ferritin    Standing Status:   Future    Standing Expiration Date:   10/27/2023   Follow up in 6 months.  All questions were answered. The patient knows to call the clinic with any problems, questions or concerns.  Earlie Server, MD, PhD [facility] Hematology Oncology 10/26/2022    CHIEF COMPLAINTS/REASON FOR VISIT:  hereditary hemochromatosis  HISTORY OF PRESENTING ILLNESS:  Dana Salas is a 51 y.o. female who was seen in consultation at the request of Ria Bush, MD for evaluation of  hereditary hemochromatosis .  Patient reports feeling fatigued recently and had blood work done by primary care provider . 2021-09-04, TIBC 253, iron saturation 84.5, ferritin 65.2.  Transferrin 181. CBC showed hemoglobin 13.7, normal WBC and platelet count. Hemochromatosis screening PCR showed C282Y/C282Y homozygous mutation. Patient also reports family history of sister was found to have elevated ferritin 600.  Sister and brothers hemochromatosis work-up is pending.  Both of her parents deceased. 2021-09-04, CMP showed normal AST, ALT, bilirubin. She drinks alcohol socially. Positive for arthralgia.  No unintentional weight loss, night sweats, fever.  Patient  was accompanied by her husband.  INTERVAL HISTORY Dana Salas is a 51 y.o. female who has above history reviewed by me today presents for follow up visit for homozygous C282Y hemochromatosis Patient reports feeling well.  She has donated blood recently.  Recently she has MRI brain done due to vision loss.  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  She has appt with neurology for further evaluation.     Review of Systems  Constitutional:  Positive for fatigue. Negative for appetite change, chills and fever.  HENT:   Negative for hearing loss and voice change.   Eyes:  Negative for eye problems.  Respiratory:  Negative for chest tightness and cough.   Cardiovascular:  Negative for chest pain.  Gastrointestinal:  Negative for abdominal distention, abdominal pain and blood in stool.  Endocrine: Negative for hot flashes.  Genitourinary:  Negative for difficulty urinating and frequency.   Musculoskeletal:  Positive for arthralgias.  Skin:  Negative for itching and rash.  Neurological:  Negative for extremity weakness.  Hematological:  Negative for adenopathy.  Psychiatric/Behavioral:  Negative for confusion.     MEDICAL HISTORY:  Past Medical History:  Diagnosis Date   Allergy to alpha-gal    Boil, labium 33/2016   Hemochromatosis, hereditary ([place]) 09/02/2021   DNA PCR test 08/2021 - positive for 2 copies of HFE gene pathogenic variant: C282Y/C282Y (HOMOZYGOTE)   Other abnormal blood chemistry    Iron,serum, elevated   Pneumonia 09/2002   outpatient    SURGICAL HISTORY: Past Surgical History:  Procedure Laterality Date   COLONOSCOPY WITH PROPOFOL N/A 04/15/2020   TA, rpt 5 yrs (Tahiliani, Lennette Bihari, MD)    SOCIAL HISTORY: Social History   Socioeconomic History   Marital status: Married    Spouse name: Not on file   Number of children: 3   Years of education: Not on file   Highest education  level: Not on file  Occupational History   Occupation: Programme researcher, broadcasting/film/video at [employer]   Smoking status: Never    Passive exposure: Never   Smokeless tobacco: Never  Vaping Use   Vaping Use: Never used  Substance and Sexual Activity   Alcohol use: Not Currently    Alcohol/week: 1.0 standard drink of alcohol    Types: 1 Glasses of wine per week   Drug use: No   Sexual activity: Yes    Partners: Male    Birth control/protection: None, Surgical    Comment: vastcotomy  Other Topics Concern   Not on file  Social History Narrative   Not on file   Social Determinants of Health   Financial Resource Strain: Not on file  Food Insecurity: Not on file  Transportation Needs: Not on file  Physical Activity: Not on file  Stress: Not on file  Social Connections: Not on file  Intimate Partner Violence: Not on file    FAMILY HISTORY: Family History  Problem Relation Age of Onset   Dementia Mother    High Cholesterol Mother    Hypertension Father    COPD Father        smoker   Heart attack Father    High Cholesterol Father    Stroke Father    Thyroid disease Father    Hypertension Sister    High Cholesterol Sister    Allergic rhinitis Sister    Asthma Sister    AVM Sister        with bleed (at [facility])   Hypertension Brother    High Cholesterol Brother    Cancer Maternal Grandmother        unsure type   Breast cancer Paternal Grandmother    Early death Paternal Grandfather 5       massive MI   Heart attack Paternal Grandfather    Eczema Neg Hx    Immunodeficiency Neg Hx    Urticaria Neg Hx     ALLERGIES:  is allergic to alpha-gal.  MEDICATIONS:  Current Outpatient Medications  Medication Sig Dispense Refill   cetirizine (ZYRTEC) 10 MG tablet Take 10 mg by mouth daily as needed for allergies. Zyrtec D     EPINEPHrine 0.3 mg/0.3 mL IJ SOAJ injection Inject 0.3 mg into the muscle as needed for anaphylaxis. 1 each 0   fluconazole (DIFLUCAN) 100 MG  tablet Patient to take 200 mg PO first day then 100 PO for 13 days. 15 tablet 0   fluticasone (FLONASE) 50 MCG/ACT nasal spray Place 1 spray into both nostrils daily.      ibuprofen (ADVIL) 200 MG tablet Take 3 tablets (600 mg total) by mouth 2 (two) times daily as needed for headache.     meloxicam (MOBIC) 7.5 MG tablet Take 1 tablet (7.5 mg total) by mouth daily. (Patient not taking: Reported on 10/26/2022) 30 tablet 0   omeprazole (PRILOSEC) 20 MG capsule TAKE 1 CAPSULE BY MOUTH DAILY (Patient not taking: Reported on 10/26/2022) 30 capsule 0   No current facility-administered medications for this visit.     PHYSICAL EXAMINATION: ECOG PERFORMANCE  STATUS: 0 - Asymptomatic Vitals:   10/26/22 1252  BP: 126/87  Pulse: 93  Resp: 16  Temp: (!) 96.9 F (36.1 C)  SpO2: 99%   Filed Weights   10/26/22 1252  Weight: 158 lb (71.7 kg)    Physical Exam Constitutional:      General: She is not in acute distress. HENT:     Head: Normocephalic and atraumatic.  Eyes:     General: No scleral icterus. Cardiovascular:     Rate and Rhythm: Normal rate and regular rhythm.     Heart sounds: Normal heart sounds.  Pulmonary:     Effort: Pulmonary effort is normal. No respiratory distress.     Breath sounds: No wheezing.  Abdominal:     General: Bowel sounds are normal. There is no distension.     Palpations: Abdomen is soft.  Musculoskeletal:        General: No deformity. Normal range of motion.     Cervical back: Normal range of motion and neck supple.  Skin:    General: Skin is warm and dry.     Findings: No erythema or rash.  Neurological:     Mental Status: She is alert and oriented to person, place, and time. Mental status is at baseline.     Cranial Nerves: No cranial nerve deficit.     Coordination: Coordination normal.  Psychiatric:        Mood and Affect: Mood normal.      LABORATORY DATA:  I have reviewed the data as listed    Latest Ref Rng & Units 10/23/2022   12:58 PM  09/08/2022   12:18 PM 06/12/2022    9:58 AM  CBC  WBC 4.0 - 10.5 K/uL 5.3  5.8  4.7   Hemoglobin 12.0 - 15.0 g/dL 13.7  14.1  13.7   Hematocrit 36.0 - 46.0 % 40.3  41.8  39.8   Platelets 150 - 400 K/uL 232  246.0  218.0       Latest Ref Rng & Units 10/23/2022   12:58 PM 09/08/2022   12:18 PM 04/22/2022    1:52 PM  CMP  Glucose 70 - 99 mg/dL 81  84  94   BUN 6 - 20 mg/dL '13  10  12   '$ Creatinine 0.44 - 1.00 mg/dL 0.73  0.67  0.79   Sodium 135 - 145 mmol/L 138  138  136   Potassium 3.5 - 5.1 mmol/L 3.8  4.2  4.1   Chloride 98 - 111 mmol/L 101  103  102   CO2 22 - 32 mmol/L '27  27  27   '$ Calcium 8.9 - 10.3 mg/dL 9.1  9.5  9.1   Total Protein 6.5 - 8.1 g/dL 7.2   7.0   Total Bilirubin 0.3 - 1.2 mg/dL 0.5   0.3   Alkaline Phos 38 - 126 U/L 68   57   AST 15 - 41 U/L 24   19   ALT 0 - 44 U/L 18   14      Iron/TIBC/Ferritin/ %Sat    Component Value Date/Time   IRON 121 10/23/2022 1258   TIBC 328 10/23/2022 1258   FERRITIN 15 10/23/2022 1258   IRONPCTSAT 37 (H) 10/23/2022 1258       RADIOGRAPHIC STUDIES: I have personally reviewed the radiological images as listed and agreed with the findings in the report.  No results found.

## 2022-10-26 NOTE — Telephone Encounter (Signed)
@  Chief Complaint@   Assessment: -    Patient is not postpartum, is not Breastfeeding. Denies any other symptoms.  Fever >100.5 No  Allergies Verified No   Plan - (Yeast infection of the breast - burning pain of breast/nipple, nipple itching, sharp breast pain, infant with white coating in mouth or diaper rash, infant diagnosed with thrush by pediatrician) Lotrimin AF or Nystatin Cream apply thin layer to nipple after each breastfeeding or pumping  , Diflucan 200 mg PO first day then 100 mg PO for 13 day  , Tylenol or Advil for discomfort  , and Wash bra, towels, and washcloths daily in hot water and dry in hot dryer

## 2022-10-28 DIAGNOSIS — H547 Unspecified visual loss: Secondary | ICD-10-CM | POA: Diagnosis not present

## 2022-10-28 DIAGNOSIS — R42 Dizziness and giddiness: Secondary | ICD-10-CM | POA: Diagnosis not present

## 2022-10-28 DIAGNOSIS — E348 Other specified endocrine disorders: Secondary | ICD-10-CM | POA: Diagnosis not present

## 2022-11-13 ENCOUNTER — Ambulatory Visit
Admission: RE | Admit: 2022-11-13 | Discharge: 2022-11-13 | Disposition: A | Discharge status: Home / Self Care | Payer: BC Managed Care – PPO | Source: Ambulatory Visit | Attending: Obstetrics and Gynecology | Admitting: Obstetrics and Gynecology

## 2022-11-13 DIAGNOSIS — Z1231 Encounter for screening mammogram for malignant neoplasm of breast: Secondary | ICD-10-CM | POA: Insufficient documentation

## 2022-11-13 DIAGNOSIS — Z01419 Encounter for gynecological examination (general) (routine) without abnormal findings: Secondary | ICD-10-CM | POA: Insufficient documentation

## 2022-11-17 ENCOUNTER — Encounter: Payer: Self-pay | Admitting: Family Medicine

## 2022-11-17 NOTE — Telephone Encounter (Signed)
error 

## 2022-11-25 DIAGNOSIS — M5136 Other intervertebral disc degeneration, lumbar region: Secondary | ICD-10-CM | POA: Diagnosis not present

## 2022-11-25 DIAGNOSIS — M9903 Segmental and somatic dysfunction of lumbar region: Secondary | ICD-10-CM | POA: Diagnosis not present

## 2022-11-25 DIAGNOSIS — M5416 Radiculopathy, lumbar region: Secondary | ICD-10-CM | POA: Diagnosis not present

## 2022-11-25 DIAGNOSIS — M62830 Muscle spasm of back: Secondary | ICD-10-CM | POA: Diagnosis not present

## 2022-12-22 DIAGNOSIS — M9903 Segmental and somatic dysfunction of lumbar region: Secondary | ICD-10-CM | POA: Diagnosis not present

## 2022-12-22 DIAGNOSIS — M5416 Radiculopathy, lumbar region: Secondary | ICD-10-CM | POA: Diagnosis not present

## 2022-12-22 DIAGNOSIS — M62830 Muscle spasm of back: Secondary | ICD-10-CM | POA: Diagnosis not present

## 2022-12-22 DIAGNOSIS — M5136 Other intervertebral disc degeneration, lumbar region: Secondary | ICD-10-CM | POA: Diagnosis not present

## 2023-01-01 ENCOUNTER — Ambulatory Visit (INDEPENDENT_AMBULATORY_CARE_PROVIDER_SITE_OTHER): Payer: BC Managed Care – PPO

## 2023-01-01 VITALS — BP 118/80 | Ht 63.0 in | Wt 160.0 lb

## 2023-01-01 DIAGNOSIS — N3001 Acute cystitis with hematuria: Secondary | ICD-10-CM | POA: Diagnosis not present

## 2023-01-01 LAB — POCT URINALYSIS DIPSTICK
Bilirubin, UA: NEGATIVE
Blood, UA: POSITIVE
Glucose, UA: NEGATIVE
Ketones, UA: NEGATIVE
Nitrite, UA: NEGATIVE
Protein, UA: NEGATIVE
Spec Grav, UA: 1.01 (ref 1.010–1.025)
Urobilinogen, UA: 1 E.U./dL
pH, UA: 6 (ref 5.0–8.0)

## 2023-01-01 MED ORDER — SULFAMETHOXAZOLE-TRIMETHOPRIM 800-160 MG PO TABS: 1.0000 | ORAL_TABLET | Freq: Two times a day (BID) | ORAL | 1 refills | Status: DC

## 2023-01-01 MED ORDER — NITROFURANTOIN MONOHYD MACRO 100 MG PO CAPS: 100.0000 mg | ORAL_CAPSULE | Freq: Two times a day (BID) | ORAL | 0 refills | Status: DC

## 2023-01-01 NOTE — Addendum Note (Signed)
Addended by: Quintella Baton D on: 01/01/2023 10:50 AM   Modules accepted: Orders

## 2023-01-01 NOTE — Progress Notes (Addendum)
    NURSE VISIT NOTE  Subjective:    Patient ID: Dana Salas, female    DOB: 07/01/1971, 52 y.o.   MRN: [id number]       HPI  Patient is a 52 y.o. G41P3003 female who presents for dysuria, urinary frequency, and pelvic pain for 6 day.  Patient denies flank pain, genital rash, genital irritation, and vaginal discharge.  Patient does not have a history of recurrent UTI.  Patient does not have a history of pyelonephritis.    Objective:    BP 118/80   Ht '5\' 3"'$  (1.6 m)   Wt 160 lb (72.6 kg)   BMI 28.34 kg/m    Lab Review  Results for orders placed or performed in visit on 01/01/23  POCT urinalysis dipstick  Result Value Ref Range   Color, UA     Clarity, UA     Glucose, UA Negative Negative   Bilirubin, UA negative    Ketones, UA negative    Spec Grav, UA 1.010 1.010 - 1.025   Blood, UA Positive    pH, UA 6.0 5.0 - 8.0   Protein, UA Negative Negative   Urobilinogen, UA 1.0 0.2 or 1.0 E.U./dL   Nitrite, UA negative    Leukocytes, UA 4+ (A) Negative   Appearance     Odor      Assessment:   1. Acute cystitis with hematuria      Plan:   Urine Culture Sent. Bactrium sent in.  Quintella Baton, CMA

## 2023-01-01 NOTE — Patient Instructions (Signed)
Urinary Tract Infection, Adult A urinary tract infection (UTI) is an infection of any part of the urinary tract. The urinary tract includes: The kidneys. The ureters. The bladder. The urethra. These organs make, store, and get rid of pee (urine) in the body. What are the causes? This infection is caused by germs (bacteria) in your genital area. These germs grow and cause swelling (inflammation) of your urinary tract. What increases the risk? The following factors may make you more likely to develop this condition: Using a small, thin tube (catheter) to drain pee. Not being able to control when you pee or poop (incontinence). Being female. If you are female, these things can increase the risk: Using these methods to prevent pregnancy: A medicine that kills sperm (spermicide). A device that blocks sperm (diaphragm). Having low levels of a female hormone (estrogen). Being pregnant. You are more likely to develop this condition if: You have genes that add to your risk. You are sexually active. You take antibiotic medicines. You have trouble peeing because of: A prostate that is bigger than normal, if you are female. A blockage in the part of your body that drains pee from the bladder. A kidney stone. A nerve condition that affects your bladder. Not getting enough to drink. Not peeing often enough. You have other conditions, such as: Diabetes. A weak disease-fighting system (immune system). Sickle cell disease. Gout. Injury of the spine. What are the signs or symptoms? Symptoms of this condition include: Needing to pee right away. Peeing small amounts often. Pain or burning when peeing. Blood in the pee. Pee that smells bad or not like normal. Trouble peeing. Pee that is cloudy. Fluid coming from the vagina, if you are female. Pain in the belly or lower back. Other symptoms include: Vomiting. Not feeling hungry. Feeling mixed up (confused). This may be the first symptom in  older adults. Being tired and grouchy (irritable). A fever. Watery poop (diarrhea). How is this treated? Taking antibiotic medicine. Taking other medicines. Drinking enough water. In some cases, you may need to see a specialist. Follow these instructions at home:  Medicines Take over-the-counter and prescription medicines only as told by your doctor. If you were prescribed an antibiotic medicine, take it as told by your doctor. Do not stop taking it even if you start to feel better. General instructions Make sure you: Pee until your bladder is empty. Do not hold pee for a long time. Empty your bladder after sex. Wipe from front to back after peeing or pooping if you are a female. Use each tissue one time when you wipe. Drink enough fluid to keep your pee pale yellow. Keep all follow-up visits. Contact a doctor if: You do not get better after 1-2 days. Your symptoms go away and then come back. Get help right away if: You have very bad back pain. You have very bad pain in your lower belly. You have a fever. You have chills. You feeling like you will vomit or you vomit. Summary A urinary tract infection (UTI) is an infection of any part of the urinary tract. This condition is caused by germs in your genital area. There are many risk factors for a UTI. Treatment includes antibiotic medicines. Drink enough fluid to keep your pee pale yellow. This information is not intended to replace advice given to you by your health care provider. Make sure you discuss any questions you have with your health care provider. Document Revised: 06/21/2020 Document Reviewed: 06/21/2020 Elsevier Patient Education    2023 Elsevier Inc.  

## 2023-01-06 ENCOUNTER — Telehealth: Payer: Self-pay

## 2023-01-06 LAB — URINE CULTURE

## 2023-01-06 NOTE — Telephone Encounter (Signed)
I called pt to see if still having symptoms. Results are back. Pt is taking Bactrim has two more days left of medication, she reports no dysuria, that has gotten better, she still has some occasional stomach pains.I do not see Bactrim on the list as susceptible. Does the medication need to be changed or will this treat the uti.

## 2023-01-07 NOTE — Telephone Encounter (Signed)
Pt aware.

## 2023-01-19 DIAGNOSIS — M5136 Other intervertebral disc degeneration, lumbar region: Secondary | ICD-10-CM | POA: Diagnosis not present

## 2023-01-19 DIAGNOSIS — M62830 Muscle spasm of back: Secondary | ICD-10-CM | POA: Diagnosis not present

## 2023-01-19 DIAGNOSIS — M9903 Segmental and somatic dysfunction of lumbar region: Secondary | ICD-10-CM | POA: Diagnosis not present

## 2023-01-19 DIAGNOSIS — M5416 Radiculopathy, lumbar region: Secondary | ICD-10-CM | POA: Diagnosis not present

## 2023-02-01 ENCOUNTER — Encounter: Payer: Self-pay | Admitting: Obstetrics and Gynecology

## 2023-02-02 MED ORDER — AMOXICILLIN-POT CLAVULANATE 875-125 MG PO TABS: 1.0000 | ORAL_TABLET | Freq: Two times a day (BID) | ORAL | 0 refills | Status: DC

## 2023-02-16 DIAGNOSIS — M5416 Radiculopathy, lumbar region: Secondary | ICD-10-CM | POA: Diagnosis not present

## 2023-02-16 DIAGNOSIS — M9903 Segmental and somatic dysfunction of lumbar region: Secondary | ICD-10-CM | POA: Diagnosis not present

## 2023-02-16 DIAGNOSIS — M62830 Muscle spasm of back: Secondary | ICD-10-CM | POA: Diagnosis not present

## 2023-02-16 DIAGNOSIS — M5136 Other intervertebral disc degeneration, lumbar region: Secondary | ICD-10-CM | POA: Diagnosis not present

## 2023-03-16 DIAGNOSIS — M5136 Other intervertebral disc degeneration, lumbar region: Secondary | ICD-10-CM | POA: Diagnosis not present

## 2023-03-16 DIAGNOSIS — M62830 Muscle spasm of back: Secondary | ICD-10-CM | POA: Diagnosis not present

## 2023-03-16 DIAGNOSIS — M9903 Segmental and somatic dysfunction of lumbar region: Secondary | ICD-10-CM | POA: Diagnosis not present

## 2023-03-16 DIAGNOSIS — M5416 Radiculopathy, lumbar region: Secondary | ICD-10-CM | POA: Diagnosis not present

## 2023-04-13 DIAGNOSIS — M5136 Other intervertebral disc degeneration, lumbar region: Secondary | ICD-10-CM | POA: Diagnosis not present

## 2023-04-13 DIAGNOSIS — M5416 Radiculopathy, lumbar region: Secondary | ICD-10-CM | POA: Diagnosis not present

## 2023-04-13 DIAGNOSIS — M9903 Segmental and somatic dysfunction of lumbar region: Secondary | ICD-10-CM | POA: Diagnosis not present

## 2023-04-13 DIAGNOSIS — M62830 Muscle spasm of back: Secondary | ICD-10-CM | POA: Diagnosis not present

## 2023-04-14 DIAGNOSIS — M79641 Pain in right hand: Secondary | ICD-10-CM | POA: Diagnosis not present

## 2023-04-27 ENCOUNTER — Inpatient Hospital Stay: Payer: BC Managed Care – PPO | Attending: Oncology

## 2023-04-27 DIAGNOSIS — Z825 Family history of asthma and other chronic lower respiratory diseases: Secondary | ICD-10-CM | POA: Diagnosis not present

## 2023-04-27 DIAGNOSIS — Z823 Family history of stroke: Secondary | ICD-10-CM | POA: Insufficient documentation

## 2023-04-27 DIAGNOSIS — Z79899 Other long term (current) drug therapy: Secondary | ICD-10-CM | POA: Insufficient documentation

## 2023-04-27 DIAGNOSIS — Z8249 Family history of ischemic heart disease and other diseases of the circulatory system: Secondary | ICD-10-CM | POA: Diagnosis not present

## 2023-04-27 DIAGNOSIS — Z8349 Family history of other endocrine, nutritional and metabolic diseases: Secondary | ICD-10-CM | POA: Insufficient documentation

## 2023-04-27 DIAGNOSIS — H9201 Otalgia, right ear: Secondary | ICD-10-CM | POA: Insufficient documentation

## 2023-04-27 DIAGNOSIS — M2550 Pain in unspecified joint: Secondary | ICD-10-CM | POA: Diagnosis not present

## 2023-04-27 DIAGNOSIS — Z809 Family history of malignant neoplasm, unspecified: Secondary | ICD-10-CM | POA: Diagnosis not present

## 2023-04-27 DIAGNOSIS — Z803 Family history of malignant neoplasm of breast: Secondary | ICD-10-CM | POA: Diagnosis not present

## 2023-04-27 DIAGNOSIS — Z818 Family history of other mental and behavioral disorders: Secondary | ICD-10-CM | POA: Diagnosis not present

## 2023-04-27 LAB — COMPREHENSIVE METABOLIC PANEL
ALT: 19 U/L (ref 0–44)
AST: 21 U/L (ref 15–41)
Albumin: 4.2 g/dL (ref 3.5–5.0)
Alkaline Phosphatase: 58 U/L (ref 38–126)
Anion gap: 6 (ref 5–15)
BUN: 13 mg/dL (ref 6–20)
CO2: 26 mmol/L (ref 22–32)
Calcium: 9.2 mg/dL (ref 8.9–10.3)
Chloride: 105 mmol/L (ref 98–111)
Creatinine, Ser: 0.67 mg/dL (ref 0.44–1.00)
GFR, Estimated: >60 mL/min (ref >60)
Glucose, Bld: 86 mg/dL (ref 70–99)
Potassium: 4.1 mmol/L (ref 3.5–5.1)
Sodium: 137 mmol/L (ref 135–145)
Total Bilirubin: 0.4 mg/dL (ref 0.3–1.2)
Total Protein: 6.9 g/dL (ref 6.5–8.1)

## 2023-04-27 LAB — CBC WITH DIFFERENTIAL/PLATELET
Abs Immature Granulocytes: 0.01 10*3/uL (ref 0.00–0.07)
Basophils Absolute: 0 10*3/uL (ref 0.0–0.1)
Basophils Relative: 1 %
Eosinophils Absolute: 0.1 10*3/uL (ref 0.0–0.5)
Eosinophils Relative: 2 %
HCT: 39.6 % (ref 36.0–46.0)
Hemoglobin: 13.3 g/dL (ref 12.0–15.0)
Immature Granulocytes: 0 %
Lymphocytes Relative: 27 %
Lymphs Abs: 1.3 10*3/uL (ref 0.7–4.0)
MCH: 31.1 pg (ref 26.0–34.0)
MCHC: 33.6 g/dL (ref 30.0–36.0)
MCV: 92.7 fL (ref 80.0–100.0)
Monocytes Absolute: 0.4 10*3/uL (ref 0.1–1.0)
Monocytes Relative: 9 %
Neutro Abs: 3 10*3/uL (ref 1.7–7.7)
Neutrophils Relative %: 61 %
Platelets: 206 10*3/uL (ref 150–400)
RBC: 4.27 MIL/uL (ref 3.87–5.11)
RDW: 12.2 % (ref 11.5–15.5)
WBC: 4.9 10*3/uL (ref 4.0–10.5)
nRBC: 0 % (ref 0.0–0.2)

## 2023-04-27 LAB — FERRITIN: Ferritin: 9 ng/mL — ABNORMAL LOW (ref 11–307)

## 2023-04-27 LAB — IRON AND TIBC
Iron: 101 ug/dL (ref 28–170)
Saturation Ratios: 33 % — ABNORMAL HIGH (ref 10.4–31.8)
TIBC: 311 ug/dL (ref 250–450)
UIBC: 210 ug/dL

## 2023-05-03 ENCOUNTER — Encounter: Payer: Self-pay | Admitting: Oncology

## 2023-05-03 ENCOUNTER — Inpatient Hospital Stay (HOSPITAL_BASED_OUTPATIENT_CLINIC_OR_DEPARTMENT_OTHER): Payer: BC Managed Care – PPO | Admitting: Oncology

## 2023-05-03 DIAGNOSIS — Z79899 Other long term (current) drug therapy: Secondary | ICD-10-CM | POA: Diagnosis not present

## 2023-05-03 DIAGNOSIS — Z825 Family history of asthma and other chronic lower respiratory diseases: Secondary | ICD-10-CM | POA: Diagnosis not present

## 2023-05-03 DIAGNOSIS — H9201 Otalgia, right ear: Secondary | ICD-10-CM | POA: Insufficient documentation

## 2023-05-03 DIAGNOSIS — Z8349 Family history of other endocrine, nutritional and metabolic diseases: Secondary | ICD-10-CM | POA: Diagnosis not present

## 2023-05-03 DIAGNOSIS — Z823 Family history of stroke: Secondary | ICD-10-CM | POA: Diagnosis not present

## 2023-05-03 DIAGNOSIS — Z809 Family history of malignant neoplasm, unspecified: Secondary | ICD-10-CM | POA: Diagnosis not present

## 2023-05-03 DIAGNOSIS — Z818 Family history of other mental and behavioral disorders: Secondary | ICD-10-CM | POA: Diagnosis not present

## 2023-05-03 DIAGNOSIS — M2550 Pain in unspecified joint: Secondary | ICD-10-CM | POA: Diagnosis not present

## 2023-05-03 DIAGNOSIS — Z803 Family history of malignant neoplasm of breast: Secondary | ICD-10-CM | POA: Diagnosis not present

## 2023-05-03 DIAGNOSIS — Z8249 Family history of ischemic heart disease and other diseases of the circulatory system: Secondary | ICD-10-CM | POA: Diagnosis not present

## 2023-05-03 NOTE — Assessment & Plan Note (Signed)
#  Hereditary hemochromatosis,C282Y/C282Y homozygous. Labs are reviewed and discussed with patient. Ferritin is at the goal, less than 50.-Ferritin level is actually low.   Recommend Patient to avoid donating blood No need for phlebotomy.  Avoid alcohol and iron supplementation

## 2023-05-03 NOTE — Assessment & Plan Note (Signed)
No significant erythematous/swelling of visible right external canal. Recommend patient to apply topical antibiotic creams if no improvement, recommend patient to contact primary care provider.

## 2023-05-03 NOTE — Progress Notes (Signed)
Pt here for follow up. Pt reports that she has pain to right ear.

## 2023-05-03 NOTE — Progress Notes (Signed)
Hematology/Oncology Progress note [phone] [phone]      Patient Care Team: Eustaquio Boyden, MD as PCP - General  ASSESSMENT & PLAN:   Hemochromatosis, hereditary [facility]) #Hereditary hemochromatosis,C282Y/C282Y homozygous. Labs are reviewed and discussed with patient. Ferritin is at the goal, less than 50.-Ferritin level is actually low.   Recommend Patient to avoid donating blood No need for phlebotomy.  Avoid alcohol and iron supplementation  Right ear pain No significant erythematous/swelling of visible right external canal. Recommend patient to apply topical antibiotic creams if no improvement, recommend patient to contact primary care provider.    Orders Placed This Encounter  Procedures   CBC with Differential (Cancer Center Only)    Standing Status:   Future    Standing Expiration Date:   05/02/2024   CMP (Cancer Center only)    Standing Status:   Future    Standing Expiration Date:   05/02/2024   Iron and TIBC    Standing Status:   Future    Standing Expiration Date:   05/02/2024   Ferritin    Standing Status:   Future    Standing Expiration Date:   05/02/2024   Follow up in 6 months.  All questions were answered. The patient knows to call the clinic with any problems, questions or concerns.  Rickard Patience, MD, PhD [facility] Hematology Oncology 05/03/2023    CHIEF COMPLAINTS/REASON FOR VISIT:  hereditary hemochromatosis  HISTORY OF PRESENTING ILLNESS:  Dana Salas is a 52 y.o. female who was seen in consultation at the request of Eustaquio Boyden, MD for evaluation of  hereditary hemochromatosis .  Patient reports feeling fatigued recently and had blood work done by primary care provider . 2021/09/05, TIBC 253, iron saturation 84.5, ferritin 65.2.  Transferrin 181. CBC showed hemoglobin 13.7, normal WBC and platelet count. Hemochromatosis screening PCR showed C282Y/C282Y homozygous mutation. Patient also reports family history  of sister was found to have elevated ferritin 600.  Sister and brothers hemochromatosis work-up is pending.  Both of her parents deceased. 2021-09-05, CMP showed normal AST, ALT, bilirubin. She drinks alcohol socially. Positive for arthralgia.  No unintentional weight loss, night sweats, fever.  Patient was accompanied by her husband.  09/18/2022 MRI showed 1.2cm pineal lesion, most likely reflects a benign incidental cyst, pineocytoma can not be excluded.  neurology recommends repeat MRI in 12 months to follow-up this lesion.  INTERVAL HISTORY Dana Salas is a 52 y.o. female who has above history reviewed by me today presents for follow up visit for homozygous C282Y hemochromatosis Patient reports feeling well.  She has donated blood in January 2024.   Review of Systems  Constitutional:  Positive for fatigue. Negative for appetite change, chills and fever.  HENT:   Negative for hearing loss and voice change.        Right ear pain  Eyes:  Negative for eye problems.  Respiratory:  Negative for chest tightness and cough.   Cardiovascular:  Negative for chest pain.  Gastrointestinal:  Negative for abdominal distention, abdominal pain and blood in stool.  Endocrine: Negative for hot flashes.  Genitourinary:  Negative for difficulty urinating and frequency.   Musculoskeletal:  Positive for arthralgias.  Skin:  Negative for itching and rash.  Neurological:  Negative for extremity weakness.  Hematological:  Negative for adenopathy.  Psychiatric/Behavioral:  Negative for confusion.     MEDICAL HISTORY:  Past Medical History:  Diagnosis Date   Allergy to alpha-gal    Boil, labium 33/2016  Hemochromatosis, hereditary (HCC) 09/02/2021   DNA PCR test 08/2021 - positive for 2 copies of HFE gene pathogenic variant: C282Y/C282Y (HOMOZYGOTE)   Other abnormal blood chemistry    Iron,serum, elevated   Pneumonia 09/2002   outpatient    SURGICAL HISTORY: Past Surgical History:   Procedure Laterality Date   COLONOSCOPY WITH PROPOFOL N/A 04/15/2020   TA, rpt 5 yrs (Tahiliani, Dolphus Jenny, MD)    SOCIAL HISTORY: Social History   Socioeconomic History   Marital status: Married    Spouse name: Not on file   Number of children: 3   Years of education: Not on file   Highest education level: Not on file  Occupational History   Occupation: Sports coach at [employer]  Tobacco Use   Smoking status: Never    Passive exposure: Never   Smokeless tobacco: Never  Vaping Use   Vaping Use: Never used  Substance and Sexual Activity   Alcohol use: Not Currently    Alcohol/week: 1.0 standard drink of alcohol    Types: 1 Glasses of wine per week   Drug use: No   Sexual activity: Yes    Partners: Male    Birth control/protection: None, Surgical    Comment: vastcotomy  Other Topics Concern   Not on file  Social History Narrative   Not on file   Social Determinants of Health   Financial Resource Strain: Not on file  Food Insecurity: Not on file  Transportation Needs: Not on file  Physical Activity: Not on file  Stress: Not on file  Social Connections: Not on file  Intimate Partner Violence: Not on file    FAMILY HISTORY: Family History  Problem Relation Age of Onset   Dementia Mother    High Cholesterol Mother    Hypertension Father    COPD Father        smoker   Heart attack Father    High Cholesterol Father    Stroke Father    Thyroid disease Father    Hypertension Sister    High Cholesterol Sister    Allergic rhinitis Sister    Asthma Sister    AVM Sister        with bleed (at [facility])   Hypertension Brother    High Cholesterol Brother    Cancer Maternal Grandmother        unsure type   Breast cancer Paternal Grandmother    Early death Paternal Grandfather 20       massive MI   Heart attack Paternal Grandfather    Eczema Neg Hx    Immunodeficiency Neg Hx    Urticaria Neg Hx     ALLERGIES:  is allergic to  alpha-gal.  MEDICATIONS:  Current Outpatient Medications  Medication Sig Dispense Refill   cetirizine (ZYRTEC) 10 MG tablet Take 10 mg by mouth daily as needed for allergies. Zyrtec D     fluticasone (FLONASE) 50 MCG/ACT nasal spray Place 1 spray into both nostrils daily.     omeprazole (PRILOSEC) 20 MG capsule TAKE 1 CAPSULE BY MOUTH DAILY (Patient taking differently: Take 20 mg by mouth daily as needed.) 30 capsule 0   progesterone (PROMETRIUM) 200 MG capsule Take 200 mg by mouth at bedtime.     amoxicillin-clavulanate (AUGMENTIN) 875-125 MG tablet Take 1 tablet by mouth 2 (two) times daily. (Patient not taking: Reported on 05/03/2023) 20 tablet 0   EPINEPHrine 0.3 mg/0.3 mL IJ SOAJ injection Inject 0.3 mg into the muscle as needed for anaphylaxis. (  Patient not taking: Reported on 01/01/2023) 1 each 0   ibuprofen (ADVIL) 200 MG tablet Take 3 tablets (600 mg total) by mouth 2 (two) times daily as needed for headache. (Patient not taking: Reported on 01/01/2023)     meloxicam (MOBIC) 7.5 MG tablet Take 1 tablet (7.5 mg total) by mouth daily. (Patient not taking: Reported on 10/26/2022) 30 tablet 0   sulfamethoxazole-trimethoprim (BACTRIM DS) 800-160 MG tablet Take 1 tablet by mouth 2 (two) times daily. (Patient not taking: Reported on 05/03/2023) 14 tablet 1   No current facility-administered medications for this visit.     PHYSICAL EXAMINATION: ECOG PERFORMANCE STATUS: 0 - Asymptomatic Vitals:   05/03/23 1310  BP: 112/84  Pulse: 80  Resp: 18  Temp: (!) 97.4 F (36.3 C)   Filed Weights   05/03/23 1310  Weight: 169 lb 12.8 oz (77 kg)    Physical Exam Constitutional:      General: She is not in acute distress. HENT:     Head: Normocephalic and atraumatic.  Eyes:     General: No scleral icterus. Cardiovascular:     Rate and Rhythm: Normal rate and regular rhythm.     Heart sounds: Normal heart sounds.  Pulmonary:     Effort: Pulmonary effort is normal. No respiratory distress.      Breath sounds: No wheezing.  Abdominal:     General: Bowel sounds are normal. There is no distension.     Palpations: Abdomen is soft.  Musculoskeletal:        General: No deformity. Normal range of motion.     Cervical back: Normal range of motion and neck supple.  Skin:    General: Skin is warm and dry.     Findings: No erythema or rash.  Neurological:     Mental Status: She is alert and oriented to person, place, and time. Mental status is at baseline.     Cranial Nerves: No cranial nerve deficit.     Coordination: Coordination normal.  Psychiatric:        Mood and Affect: Mood normal.      LABORATORY DATA:  I have reviewed the data as listed    Latest Ref Rng & Units 04/27/2023   11:04 AM 10/23/2022   12:58 PM 09/08/2022   12:18 PM  CBC  WBC 4.0 - 10.5 K/uL 4.9  5.3  5.8   Hemoglobin 12.0 - 15.0 g/dL 16.1  09.6  04.5   Hematocrit 36.0 - 46.0 % 39.6  40.3  41.8   Platelets 150 - 400 K/uL 206  232  246.0       Latest Ref Rng & Units 04/27/2023   11:04 AM 10/23/2022   12:58 PM 09/08/2022   12:18 PM  CMP  Glucose 70 - 99 mg/dL 86  81  84   BUN 6 - 20 mg/dL 13  13  10    Creatinine 0.44 - 1.00 mg/dL 4.09  8.11  9.14   Sodium 135 - 145 mmol/L 137  138  138   Potassium 3.5 - 5.1 mmol/L 4.1  3.8  4.2   Chloride 98 - 111 mmol/L 105  101  103   CO2 22 - 32 mmol/L 26  27  27    Calcium 8.9 - 10.3 mg/dL 9.2  9.1  9.5   Total Protein 6.5 - 8.1 g/dL 6.9  7.2    Total Bilirubin 0.3 - 1.2 mg/dL 0.4  0.5    Alkaline Phos 38 - 126 U/L 58  68    AST 15 - 41 U/L 21  24    ALT 0 - 44 U/L 19  18       Iron/TIBC/Ferritin/ %Sat    Component Value Date/Time   IRON 101 04/27/2023 1104   TIBC 311 04/27/2023 1104   FERRITIN 9 (L) 04/27/2023 1104   IRONPCTSAT 33 (H) 04/27/2023 1104       RADIOGRAPHIC STUDIES: I have personally reviewed the radiological images as listed and agreed with the findings in the report.  No results found.

## 2023-05-10 DIAGNOSIS — M5416 Radiculopathy, lumbar region: Secondary | ICD-10-CM | POA: Diagnosis not present

## 2023-05-10 DIAGNOSIS — M5136 Other intervertebral disc degeneration, lumbar region: Secondary | ICD-10-CM | POA: Diagnosis not present

## 2023-05-10 DIAGNOSIS — M62830 Muscle spasm of back: Secondary | ICD-10-CM | POA: Diagnosis not present

## 2023-05-10 DIAGNOSIS — M9903 Segmental and somatic dysfunction of lumbar region: Secondary | ICD-10-CM | POA: Diagnosis not present

## 2023-06-15 DIAGNOSIS — M5416 Radiculopathy, lumbar region: Secondary | ICD-10-CM | POA: Diagnosis not present

## 2023-06-15 DIAGNOSIS — M9903 Segmental and somatic dysfunction of lumbar region: Secondary | ICD-10-CM | POA: Diagnosis not present

## 2023-06-15 DIAGNOSIS — M5136 Other intervertebral disc degeneration, lumbar region: Secondary | ICD-10-CM | POA: Diagnosis not present

## 2023-06-15 DIAGNOSIS — M62830 Muscle spasm of back: Secondary | ICD-10-CM | POA: Diagnosis not present

## 2023-06-27 ENCOUNTER — Encounter: Payer: Self-pay | Admitting: Oncology

## 2023-07-12 DIAGNOSIS — M62830 Muscle spasm of back: Secondary | ICD-10-CM | POA: Diagnosis not present

## 2023-07-12 DIAGNOSIS — M9903 Segmental and somatic dysfunction of lumbar region: Secondary | ICD-10-CM | POA: Diagnosis not present

## 2023-07-12 DIAGNOSIS — M5136 Other intervertebral disc degeneration, lumbar region: Secondary | ICD-10-CM | POA: Diagnosis not present

## 2023-07-12 DIAGNOSIS — M5416 Radiculopathy, lumbar region: Secondary | ICD-10-CM | POA: Diagnosis not present

## 2023-07-13 DIAGNOSIS — R42 Dizziness and giddiness: Secondary | ICD-10-CM | POA: Diagnosis not present

## 2023-07-13 DIAGNOSIS — R519 Headache, unspecified: Secondary | ICD-10-CM | POA: Diagnosis not present

## 2023-07-13 DIAGNOSIS — H547 Unspecified visual loss: Secondary | ICD-10-CM | POA: Diagnosis not present

## 2023-07-13 DIAGNOSIS — E348 Other specified endocrine disorders: Secondary | ICD-10-CM | POA: Diagnosis not present

## 2023-07-14 ENCOUNTER — Other Ambulatory Visit: Payer: Self-pay | Admitting: Student

## 2023-07-14 DIAGNOSIS — H547 Unspecified visual loss: Secondary | ICD-10-CM

## 2023-07-14 DIAGNOSIS — R519 Headache, unspecified: Secondary | ICD-10-CM

## 2023-07-27 ENCOUNTER — Encounter: Payer: Self-pay | Admitting: Student

## 2023-08-04 ENCOUNTER — Encounter: Payer: Self-pay | Admitting: Oncology

## 2023-08-04 ENCOUNTER — Ambulatory Visit
Admission: RE | Admit: 2023-08-04 | Discharge: 2023-08-04 | Disposition: A | Discharge status: Home / Self Care | Payer: BC Managed Care – PPO | Source: Ambulatory Visit | Attending: Student | Admitting: Student

## 2023-08-04 DIAGNOSIS — R519 Headache, unspecified: Secondary | ICD-10-CM | POA: Diagnosis not present

## 2023-08-04 DIAGNOSIS — H547 Unspecified visual loss: Secondary | ICD-10-CM

## 2023-08-04 DIAGNOSIS — H538 Other visual disturbances: Secondary | ICD-10-CM | POA: Diagnosis not present

## 2023-08-04 MED ORDER — GADOPICLENOL 0.5 MMOL/ML IV SOLN
7.5000 mL | Freq: Once | INTRAVENOUS | Status: AC | PRN
  Administered 2023-08-04: 7.5 mL via INTRAVENOUS

## 2023-08-10 DIAGNOSIS — M62830 Muscle spasm of back: Secondary | ICD-10-CM | POA: Diagnosis not present

## 2023-08-10 DIAGNOSIS — M5416 Radiculopathy, lumbar region: Secondary | ICD-10-CM | POA: Diagnosis not present

## 2023-08-10 DIAGNOSIS — M9903 Segmental and somatic dysfunction of lumbar region: Secondary | ICD-10-CM | POA: Diagnosis not present

## 2023-08-10 DIAGNOSIS — M5136 Other intervertebral disc degeneration, lumbar region: Secondary | ICD-10-CM | POA: Diagnosis not present

## 2023-08-11 ENCOUNTER — Ambulatory Visit (INDEPENDENT_AMBULATORY_CARE_PROVIDER_SITE_OTHER): Payer: BC Managed Care – PPO | Admitting: Family Medicine

## 2023-08-11 VITALS — BP 122/84 | HR 75 | Temp 98.1°F | Wt 157.6 lb

## 2023-08-11 DIAGNOSIS — W57XXXS Bitten or stung by nonvenomous insect and other nonvenomous arthropods, sequela: Secondary | ICD-10-CM

## 2023-08-11 DIAGNOSIS — N951 Menopausal and female climacteric states: Secondary | ICD-10-CM | POA: Diagnosis not present

## 2023-08-11 DIAGNOSIS — R5383 Other fatigue: Secondary | ICD-10-CM

## 2023-08-11 DIAGNOSIS — R519 Headache, unspecified: Secondary | ICD-10-CM

## 2023-08-11 DIAGNOSIS — H539 Unspecified visual disturbance: Secondary | ICD-10-CM

## 2023-08-11 DIAGNOSIS — E348 Other specified endocrine disorders: Secondary | ICD-10-CM

## 2023-08-11 DIAGNOSIS — Z91018 Allergy to other foods: Secondary | ICD-10-CM

## 2023-08-11 DIAGNOSIS — S90861S Insect bite (nonvenomous), right foot, sequela: Secondary | ICD-10-CM

## 2023-08-11 DIAGNOSIS — H53452 Other localized visual field defect, left eye: Secondary | ICD-10-CM

## 2023-08-11 LAB — SEDIMENTATION RATE: Sed Rate: 5 mm/h (ref 0–30)

## 2023-08-11 LAB — VITAMIN B12: Vitamin B-12: 831 pg/mL (ref 211–911)

## 2023-08-11 LAB — TSH: TSH: 1.54 u[IU]/mL (ref 0.35–5.50)

## 2023-08-11 NOTE — Patient Instructions (Addendum)
Repeat labs today  We will be in touch with results  Good to see you today

## 2023-08-11 NOTE — Progress Notes (Unsigned)
Ph: [phone] Fax: [phone]   Patient ID: Dana Salas, female    DOB: 1971-10-27, 52 y.o.   MRN: [id number]  This visit was conducted in person.  BP 122/84   Pulse 75   Temp 98.1 F (36.7 C)   Wt 157 lb 9.6 oz (71.5 kg)   SpO2 98%   BMI 27.92 kg/m    No results found.  CC: discuss vision and recent MRI imaging  Subjective:   HPI: Dana Salas is a 52 y.o. female presenting on 08/11/2023 for Discuss Imaging  (Pt started having vision issues again a few months ago; similar to the issues she has prior. Went back to the neurologist and MRI showed that the cyst that was seen previously has not grown. Pt feels all of this started after she was diagnosed with Alpha Gal and wants to discuss possible labs for Lyme. )   Known hemochromatosis dx 2022 and alpha gal diagnosed summer 2023.  H/o headaches with transient vision changes - last month developed repeat transient blurry vision. Describes "jelly" blurred vision starts left eye and can travel to right eye, that lasts 15 min at a time associated with headache. Saw neurology in follow up - MRI brain with/without contrasted reassuringly normal last week - showed stable 1.2cm cystic pineal gland lesion, unchanged from imaging 08/2022. ?migraine related. Notes intermittent R ear discomfort/ache since she had L swimmers ear several years ago. She does have a pool. Occ forehead pain with headache. No jaw claudication but does note chronic TMJ and jaw clenching, bilaterally.   She last saw eye doctor 09/2022.   Tick bite occurred Summer 2023 - with resultant alpha gal allergy - continues avoiding all mammalian meats. She is now able to tolerate dairy.  Did not have fever, rash associated with tick bite or since. She does note she stays fatigued. Entire right foot did swell and have rash around site of tick bite. She did receive 10d doxycycline course at that time.   Hereditary hemochromatosis (C282Y/C282Y homozygous) followed by  hematology - all labs at goal. Latest labs showed low iron level and ferritin. Advised not to donate blood.   12 lb weight loss over the past 3 months - notes healthier eating and increase in exercise.   Sister with L cerebral AVM (thought hereditary).  Father with stroke and MI.   She started hormone replacement with subdermal pellet (testosterone and estrogen) also takes progesterone 200mg  nightly.  LMP - last week - irregular, approx every 2 months.      Relevant past medical, surgical, family and social history reviewed and updated as indicated. Interim medical history since our last visit reviewed. Allergies and medications reviewed and updated. Outpatient Medications Prior to Visit  Medication Sig Dispense Refill   cetirizine (ZYRTEC) 10 MG tablet Take 10 mg by mouth daily as needed for allergies. Zyrtec D     fluticasone (FLONASE) 50 MCG/ACT nasal spray Place 1 spray into both nostrils daily.     ibuprofen (ADVIL) 200 MG tablet Take 3 tablets (600 mg total) by mouth 2 (two) times daily as needed for headache.     progesterone (PROMETRIUM) 200 MG capsule Take 200 mg by mouth at bedtime.     EPINEPHrine 0.3 mg/0.3 mL IJ SOAJ injection Inject 0.3 mg into the muscle as needed for anaphylaxis. (Patient not taking: Reported on 01/01/2023) 1 each 0   meloxicam (MOBIC) 7.5 MG tablet Take 1 tablet (7.5 mg total) by mouth daily. (Patient not  taking: Reported on 10/26/2022) 30 tablet 0   omeprazole (PRILOSEC) 20 MG capsule TAKE 1 CAPSULE BY MOUTH DAILY (Patient not taking: Reported on 08/11/2023) 30 capsule 0   amoxicillin-clavulanate (AUGMENTIN) 875-125 MG tablet Take 1 tablet by mouth 2 (two) times daily. (Patient not taking: Reported on 05/03/2023) 20 tablet 0   sulfamethoxazole-trimethoprim (BACTRIM DS) 800-160 MG tablet Take 1 tablet by mouth 2 (two) times daily. (Patient not taking: Reported on 05/03/2023) 14 tablet 1   No facility-administered medications prior to visit.     Per HPI unless  specifically indicated in ROS section below Review of Systems  Objective:  BP 122/84   Pulse 75   Temp 98.1 F (36.7 C)   Wt 157 lb 9.6 oz (71.5 kg)   SpO2 98%   BMI 27.92 kg/m   Wt Readings from Last 3 Encounters:  08/11/23 157 lb 9.6 oz (71.5 kg)  05/03/23 169 lb 12.8 oz (77 kg)  01/01/23 160 lb (72.6 kg)      Physical Exam Vitals and nursing note reviewed.  Constitutional:      Appearance: Normal appearance. She is not ill-appearing.  HENT:     Head: Normocephalic and atraumatic.     Mouth/Throat:     Mouth: Mucous membranes are moist.     Pharynx: Oropharynx is clear. No oropharyngeal exudate or posterior oropharyngeal erythema.  Eyes:     Extraocular Movements: Extraocular movements intact.     Conjunctiva/sclera: Conjunctivae normal.     Pupils: Pupils are equal, round, and reactive to light.  Neck:     Thyroid: No thyroid mass or thyromegaly.  Cardiovascular:     Rate and Rhythm: Normal rate and regular rhythm.     Pulses: Normal pulses.     Heart sounds: Normal heart sounds. No murmur heard. Pulmonary:     Effort: Pulmonary effort is normal. No respiratory distress.     Breath sounds: Normal breath sounds. No wheezing or rhonchi.  Musculoskeletal:     Cervical back: Normal range of motion and neck supple. No rigidity.     Right lower leg: No edema.     Left lower leg: No edema.  Lymphadenopathy:     Cervical: No cervical adenopathy.  Skin:    General: Skin is warm and dry.     Findings: No rash.  Neurological:     General: No focal deficit present.     Mental Status: She is alert.     Cranial Nerves: Cranial nerves 2-12 are intact.     Sensory: Sensation is intact.     Motor: Motor function is intact.     Coordination: Coordination is intact. Romberg sign negative.     Gait: Gait is intact.     Comments:  CN 2-12 intact FTN intact No pronator drift  Psychiatric:        Mood and Affect: Mood normal.        Behavior: Behavior normal.        Results for orders placed or performed in visit on 08/11/23  Vitamin B12  Result Value Ref Range   Vitamin B-12 831 211 - 911 pg/mL  TSH  Result Value Ref Range   TSH 1.54 0.35 - 5.50 uIU/mL  Sedimentation rate  Result Value Ref Range   Sed Rate 5 0 - 30 mm/hr   Lab Results  Component Value Date   NA 137 04/27/2023   CL 105 04/27/2023   K 4.1 04/27/2023   CO2 26 04/27/2023  BUN 13 04/27/2023   CREATININE 0.67 04/27/2023   GFRNONAA >60 04/27/2023   CALCIUM 9.2 04/27/2023   ALBUMIN 4.2 04/27/2023   GLUCOSE 86 04/27/2023    Lab Results  Component Value Date   ALT 19 04/27/2023   AST 21 04/27/2023   ALKPHOS 58 04/27/2023   BILITOT 0.4 04/27/2023    Lab Results  Component Value Date   WBC 4.9 04/27/2023   HGB 13.3 04/27/2023   HCT 39.6 04/27/2023   MCV 92.7 04/27/2023   PLT 206 04/27/2023   Assessment & Plan:   Problem List Items Addressed This Visit     Hemochromatosis, hereditary (HCC) (Chronic)    Appreciate hematology care.       Perimenopause    Now on hormone replacement via pellet       Tick bite of right foot    Significant difficulty since tick bite suffered last summer 2023.  Notes worsening headaches, fatigue and vision changes as per above.  Previous lyme disease testing was negative. Will update this.       Relevant Orders   B. burgdorfi antibodies by WB   Vision changes    Recurrent issue, at times associated with headache but not always. Seeing neurology for this - ?related to migraines vs silent migraine.  Recent contrasted brain MRI reassuring.       Relevant Orders   Sedimentation rate (Completed)   Allergy to galactose-alpha-1,3-galactose - Primary    She notes she is better able to tolerate dairy and small amounts of red meat.  Update alpha gal levels.       Relevant Orders   Alpha-Gal Panel   Pineal gland cyst    Stable on latest MRI      Headache    Intermittent headaches associated with vision changes though migraine  related sees neurology.  Update ESR.       Other Visit Diagnoses     Fatigue, unspecified type       Relevant Orders   Vitamin B12 (Completed)   TSH (Completed)   Sedimentation rate (Completed)        No orders of the defined types were placed in this encounter.   Orders Placed This Encounter  Procedures   Alpha-Gal Panel   B. burgdorfi antibodies by WB   Vitamin B12   TSH   Sedimentation rate    Patient Instructions  Repeat labs today  We will be in touch with results  Good to see you today   Follow up plan: No follow-ups on file.  Eustaquio Boyden, MD

## 2023-08-12 DIAGNOSIS — R519 Headache, unspecified: Secondary | ICD-10-CM | POA: Insufficient documentation

## 2023-08-12 DIAGNOSIS — G43109 Migraine with aura, not intractable, without status migrainosus: Secondary | ICD-10-CM | POA: Insufficient documentation

## 2023-08-12 NOTE — Assessment & Plan Note (Signed)
Intermittent headaches associated with vision changes though migraine related sees neurology.  Update ESR.

## 2023-08-12 NOTE — Assessment & Plan Note (Signed)
Stable on latest MRI

## 2023-08-12 NOTE — Assessment & Plan Note (Signed)
Now on hormone replacement via pellet

## 2023-08-12 NOTE — Assessment & Plan Note (Signed)
Significant difficulty since tick bite suffered last summer 2023.  Notes worsening headaches, fatigue and vision changes as per above.  Previous lyme disease testing was negative. Will update this.

## 2023-08-12 NOTE — Assessment & Plan Note (Signed)
She notes she is better able to tolerate dairy and small amounts of red meat.  Update alpha gal levels.

## 2023-08-12 NOTE — Assessment & Plan Note (Signed)
Recurrent issue, at times associated with headache but not always. Seeing neurology for this - ?related to migraines vs silent migraine.  Recent contrasted brain MRI reassuring.

## 2023-08-12 NOTE — Assessment & Plan Note (Signed)
Appreciate hematology care.

## 2023-08-17 DIAGNOSIS — R519 Headache, unspecified: Secondary | ICD-10-CM | POA: Diagnosis not present

## 2023-08-17 DIAGNOSIS — E348 Other specified endocrine disorders: Secondary | ICD-10-CM | POA: Diagnosis not present

## 2023-08-17 DIAGNOSIS — H547 Unspecified visual loss: Secondary | ICD-10-CM | POA: Diagnosis not present

## 2023-08-17 DIAGNOSIS — R42 Dizziness and giddiness: Secondary | ICD-10-CM | POA: Diagnosis not present

## 2023-08-18 LAB — B. BURGDORFI ANTIBODIES BY WB
B burgdorferi IgG Abs (IB): NEGATIVE
B burgdorferi IgM Abs (IB): NEGATIVE
Lyme Disease 18 kD IgG: NONREACTIVE
Lyme Disease 23 kD IgG: NONREACTIVE
Lyme Disease 23 kD IgM: NONREACTIVE
Lyme Disease 28 kD IgG: NONREACTIVE
Lyme Disease 30 kD IgG: NONREACTIVE
Lyme Disease 39 kD IgG: NONREACTIVE
Lyme Disease 39 kD IgM: NONREACTIVE
Lyme Disease 41 kD IgG: NONREACTIVE
Lyme Disease 41 kD IgM: NONREACTIVE
Lyme Disease 45 kD IgG: NONREACTIVE
Lyme Disease 58 kD IgG: REACTIVE — AB
Lyme Disease 66 kD IgG: NONREACTIVE
Lyme Disease 93 kD IgG: NONREACTIVE

## 2023-08-18 LAB — ALPHA-GAL PANEL
Allergen, Mutton, f88: 4.5 kU/L — ABNORMAL HIGH
Allergen, Pork, f26: 3.96 kU/L — ABNORMAL HIGH
Beef: 10.1 kU/L — ABNORMAL HIGH
CLASS: 3
CLASS: 3
Class: 3
GALACTOSE-ALPHA-1,3-GALACTOSE IGE*: 15.1 kU/L — ABNORMAL HIGH (ref <0.10)

## 2023-08-18 LAB — INTERPRETATION:

## 2023-09-07 DIAGNOSIS — M9903 Segmental and somatic dysfunction of lumbar region: Secondary | ICD-10-CM | POA: Diagnosis not present

## 2023-09-07 DIAGNOSIS — M62830 Muscle spasm of back: Secondary | ICD-10-CM | POA: Diagnosis not present

## 2023-09-07 DIAGNOSIS — M5416 Radiculopathy, lumbar region: Secondary | ICD-10-CM | POA: Diagnosis not present

## 2023-09-27 ENCOUNTER — Encounter: Payer: Self-pay | Admitting: Oncology

## 2023-09-27 ENCOUNTER — Encounter: Payer: Self-pay | Admitting: Obstetrics and Gynecology

## 2023-10-10 ENCOUNTER — Encounter: Payer: Self-pay | Admitting: Oncology

## 2023-10-11 ENCOUNTER — Encounter: Payer: Self-pay | Admitting: Family Medicine

## 2023-10-11 ENCOUNTER — Encounter: Payer: Self-pay | Admitting: *Deleted

## 2023-10-11 ENCOUNTER — Ambulatory Visit: Payer: BC Managed Care – PPO | Admitting: Family Medicine

## 2023-10-11 VITALS — BP 126/82 | HR 65 | Temp 98.3°F | Ht 63.0 in | Wt 163.0 lb

## 2023-10-11 DIAGNOSIS — Z91018 Allergy to other foods: Secondary | ICD-10-CM

## 2023-10-11 DIAGNOSIS — N951 Menopausal and female climacteric states: Secondary | ICD-10-CM | POA: Diagnosis not present

## 2023-10-11 DIAGNOSIS — R519 Headache, unspecified: Secondary | ICD-10-CM

## 2023-10-11 DIAGNOSIS — E785 Hyperlipidemia, unspecified: Secondary | ICD-10-CM | POA: Diagnosis not present

## 2023-10-11 DIAGNOSIS — E049 Nontoxic goiter, unspecified: Secondary | ICD-10-CM

## 2023-10-11 DIAGNOSIS — E041 Nontoxic single thyroid nodule: Secondary | ICD-10-CM | POA: Insufficient documentation

## 2023-10-11 DIAGNOSIS — R12 Heartburn: Secondary | ICD-10-CM

## 2023-10-11 DIAGNOSIS — Z0000 Encounter for general adult medical examination without abnormal findings: Secondary | ICD-10-CM

## 2023-10-11 LAB — LIPID PANEL
Cholesterol: 217 mg/dL — ABNORMAL HIGH (ref 0–200)
HDL: 56.5 mg/dL (ref >39.00)
LDL Cholesterol: 137 mg/dL — ABNORMAL HIGH (ref 0–99)
NonHDL: 160.67
Total CHOL/HDL Ratio: 4
Triglycerides: 120 mg/dL (ref 0.0–149.0)
VLDL: 24 mg/dL (ref 0.0–40.0)

## 2023-10-11 LAB — BASIC METABOLIC PANEL
BUN: 8 mg/dL (ref 6–23)
CO2: 26 meq/L (ref 19–32)
Calcium: 9.1 mg/dL (ref 8.4–10.5)
Chloride: 103 meq/L (ref 96–112)
Creatinine, Ser: 0.75 mg/dL (ref 0.40–1.20)
GFR: 92.89 mL/min (ref >60.00)
Glucose, Bld: 82 mg/dL (ref 70–99)
Potassium: 4.1 meq/L (ref 3.5–5.1)
Sodium: 138 meq/L (ref 135–145)

## 2023-10-11 MED ORDER — OMEPRAZOLE 20 MG PO CPDR: 20.0000 mg | DELAYED_RELEASE_CAPSULE | Freq: Every day | ORAL | Status: DC | PRN

## 2023-10-11 NOTE — Assessment & Plan Note (Addendum)
Sees hematology regularly, appreciate their care.  Upcoming appt next month.

## 2023-10-11 NOTE — Assessment & Plan Note (Signed)
Fullness to R thyroid gland. Notes possible longstanding difficulty swallowing. Check baseline thyroid US.

## 2023-10-11 NOTE — Assessment & Plan Note (Addendum)
Chronic, mild off medication, update labs. The 10-year ASCVD risk score (Arnett DK, et al., 2019) is: 0.8%   Values used to calculate the score:     Age: 52 years     Sex: Female     Is Non-Hispanic African American: No     Diabetic: No     Tobacco smoker: No     Systolic Blood Pressure: 126 mmHg     Is BP treated: No     HDL Cholesterol: 74.1 mg/dL     Total Cholesterol: 191 mg/dL

## 2023-10-11 NOTE — Assessment & Plan Note (Signed)
[facility] Neurology.

## 2023-10-11 NOTE — Assessment & Plan Note (Signed)
Preventative protocols reviewed and updated unless pt declined. Discussed healthy diet and lifestyle.  

## 2023-10-11 NOTE — Assessment & Plan Note (Signed)
Continues pretty regular cycles, now with some new vaginal spotting in setting of hormone replacement (estrogen/testosterone pellet + daily progesterone pill). She will touch base with GYN about this later this week.

## 2023-10-11 NOTE — Patient Instructions (Addendum)
Consider shingrix shot series Labs today  We will order thyroid ultrasound Good to see you today  Return as needed or in 1 year for next physical

## 2023-10-11 NOTE — Progress Notes (Signed)
Ph: [phone] Fax: [phone]   Patient ID: Dana Salas, female    DOB: [birth year], 52 y.o.   MRN: [id number]  This visit was conducted in person.  BP 126/82   Pulse 65   Temp 98.3 F (36.8 C) (Oral)   Ht 5\' 3"  (1.6 m)   Wt 163 lb (73.9 kg)   SpO2 100%   BMI 28.87 kg/m    CC: CPE Subjective:   HPI: Dana Salas is a 52 y.o. female presenting on 10/11/2023 for Annual Exam   Known Hereditary hemochromatosis (C282Y/C282Y homozygous) dx 2022 followed by hematology and alpha gal diagnosed summer 2023.   ?perimenopausal - cycles irregular with increasing cramping and headaches and weight gain. Saw GYN - reassuring pelvic ultrasound. Last cycle was 80d ago.    H/o headaches with transient vision changes - last month developed repeat transient blurry vision. 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 ?related to ocular migraine. Last saw [facility] neurology 07/2023 - started Maxalt abortive treatment PRN.   Sister with L cerebral AVM (thought hereditary). Brain MRI reassuring 07/2023.  Father with h/o stroke and MI.    She started hormone replacement with subdermal pellet (testosterone and estrogen) also takes progesterone 200mg  nightly. Notes some intermittent vaginal spotting LMP: 10/03/2023 54-63 day cycles   Preventative: COLONOSCOPY WITH PROPOFOL 04/15/2020 - TA, rpt 5 yrs (Tahiliani, Dolphus Jenny, MD) Well woman with OBGYN Hildred Laser) last seen 08/2022. Last pap 09/2020. Upcoming appt later this week - to discuss HRT with her.  Mammo 10/2022 - Birads1 @ [place] - h/o fibrocystic breast disease Lung cancer screening  Flu - declines COVID vaccine - has decided to defer for now  Td 2005, 2011, Tdap 08/2022 Shingrix - discussed  Seat belt use discussed  Sunscreen use discussed. No changing moles on skin. Sees derm.  Sleep - averages 8 hours/night  Non smoker  Alcohol  - occasional  Dentist - q6 mo  Eye exam yearly    Lives with husband and 3 children (1 in college) Edu: bachelor's Occ: Network engineer  Activity: some online exercise programs, no regular exercise Diet: good water, fruits/vegetables daily     Relevant past medical, surgical, family and social history reviewed and updated as indicated. Interim medical history since our last visit reviewed. Allergies and medications reviewed and updated. Outpatient Medications Prior to Visit  Medication Sig Dispense Refill   cetirizine (ZYRTEC) 10 MG tablet Take 10 mg by mouth daily as needed for allergies. Zyrtec D     EPINEPHrine 0.3 mg/0.3 mL IJ SOAJ injection Inject 0.3 mg into the muscle as needed for anaphylaxis. 1 each 0   fluticasone (FLONASE) 50 MCG/ACT nasal spray Place 1 spray into both nostrils daily.     ibuprofen (ADVIL) 200 MG tablet Take 3 tablets (600 mg total) by mouth 2 (two) times daily as needed for headache.     progesterone (PROMETRIUM) 200 MG capsule Take 200 mg by mouth at bedtime.     rizatriptan (MAXALT) 5 MG tablet Take 1 tablet (5 mg total) by mouth as needed for migraine. May repeat in 2 hours if needed     meloxicam (MOBIC) 7.5 MG tablet Take 1 tablet (7.5 mg total) by mouth daily. 30 tablet 0   omeprazole (PRILOSEC) 20 MG capsule TAKE 1 CAPSULE BY MOUTH DAILY 30 capsule 0   No facility-administered medications prior to visit.  Per HPI unless specifically indicated in ROS section below Review of Systems  Constitutional:  Negative for activity change, appetite change, chills, fatigue, fever and unexpected weight change.  HENT:  Negative for hearing loss.   Eyes:  Negative for visual disturbance.  Respiratory:  Negative for cough, chest tightness, shortness of breath and wheezing.   Cardiovascular:  Negative for chest pain, palpitations and leg swelling.  Gastrointestinal:  Positive for abdominal pain (occ lower pelvic). Negative for abdominal  distention, blood in stool, constipation, diarrhea, nausea and vomiting.  Genitourinary:  Negative for difficulty urinating and hematuria.  Musculoskeletal:  Positive for arthralgias. Negative for myalgias and neck pain.       Chronic left ankle insoability   Skin:  Negative for rash.  Neurological:  Negative for dizziness, seizures, syncope and headaches.  Hematological:  Negative for adenopathy. Bruises/bleeds easily.  Psychiatric/Behavioral:  Negative for dysphoric mood. The patient is not nervous/anxious.     Objective:  BP 126/82   Pulse 65   Temp 98.3 F (36.8 C) (Oral)   Ht 5\' 3"  (1.6 m)   Wt 163 lb (73.9 kg)   SpO2 100%   BMI 28.87 kg/m   Wt Readings from Last 3 Encounters:  10/11/23 163 lb (73.9 kg)  08/11/23 157 lb 9.6 oz (71.5 kg)  05/03/23 169 lb 12.8 oz (77 kg)      Physical Exam Vitals and nursing note reviewed.  Constitutional:      Appearance: Normal appearance. She is not ill-appearing.  HENT:     Head: Normocephalic and atraumatic.     Right Ear: Tympanic membrane, ear canal and external ear normal. There is no impacted cerumen.     Left Ear: Tympanic membrane, ear canal and external ear normal. There is no impacted cerumen.     Mouth/Throat:     Mouth: Mucous membranes are moist.     Pharynx: Oropharynx is clear. No oropharyngeal exudate or posterior oropharyngeal erythema.  Eyes:     General:        Right eye: No discharge.        Left eye: No discharge.     Extraocular Movements: Extraocular movements intact.     Conjunctiva/sclera: Conjunctivae normal.     Pupils: Pupils are equal, round, and reactive to light.  Neck:     Thyroid: Thyromegaly (right sided fullness) present. No thyroid mass or thyroid tenderness.  Cardiovascular:     Rate and Rhythm: Normal rate and regular rhythm.     Pulses: Normal pulses.     Heart sounds: Normal heart sounds. No murmur heard. Pulmonary:     Effort: Pulmonary effort is normal. No respiratory distress.      Breath sounds: Normal breath sounds. No wheezing, rhonchi or rales.  Abdominal:     General: Bowel sounds are normal. There is no distension.     Palpations: Abdomen is soft. There is no mass.     Tenderness: There is no abdominal tenderness. There is no guarding or rebound.     Hernia: No hernia is present.  Musculoskeletal:     Cervical back: Normal range of motion and neck supple. No rigidity.     Right lower leg: No edema.     Left lower leg: No edema.  Lymphadenopathy:     Cervical: No cervical adenopathy.  Skin:    General: Skin is warm and dry.     Findings: No rash.  Neurological:     General: No focal deficit present.  Mental Status: She is alert. Mental status is at baseline.  Psychiatric:        Mood and Affect: Mood normal.        Behavior: Behavior normal.       Results for orders placed or performed in visit on 08/11/23  Alpha-Gal Panel  Result Value Ref Range   Beef 10.10 (H) kU/L   CLASS 3    Allergen, Mutton, f88 4.50 (H) kU/L   Class 3    Allergen, Pork, f26 3.96 (H) kU/L   CLASS 3    GALACTOSE-ALPHA-1,3-GALACTOSE IGE* 15.1 (H) <0.10 kU/L  B. burgdorfi antibodies by WB  Result Value Ref Range   B burgdorferi IgG Abs (IB) NEGATIVE NEGATIVE   Lyme Disease 18 kD IgG NON-REACTIVE    Lyme Disease 23 kD IgG NON-REACTIVE    Lyme Disease 28 kD IgG NON-REACTIVE    Lyme Disease 30 kD IgG NON-REACTIVE    Lyme Disease 39 kD IgG NON-REACTIVE    Lyme Disease 41 kD IgG NON-REACTIVE    Lyme Disease 45 kD IgG NON-REACTIVE    Lyme Disease 58 kD IgG REACTIVE (A)    Lyme Disease 66 kD IgG NON-REACTIVE    Lyme Disease 93 kD IgG NON-REACTIVE    B burgdorferi IgM Abs (IB) NEGATIVE NEGATIVE   Lyme Disease 23 kD IgM NON-REACTIVE    Lyme Disease 39 kD IgM NON-REACTIVE    Lyme Disease 41 kD IgM NON-REACTIVE   Vitamin B12  Result Value Ref Range   Vitamin B-12 831 211 - 911 pg/mL  TSH  Result Value Ref Range   TSH 1.54 0.35 - 5.50 uIU/mL  Sedimentation rate  Result  Value Ref Range   Sed Rate 5 0 - 30 mm/hr  Interpretation:  Result Value Ref Range   Interpretation      Assessment & Plan:   Problem List Items Addressed This Visit     Health maintenance examination - Primary (Chronic)    Preventative protocols reviewed and updated unless pt declined. Discussed healthy diet and lifestyle.       Hemochromatosis, hereditary (HCC) (Chronic)    Sees hematology regularly, appreciate their care.  Upcoming appt next month.       Hyperlipidemia    Chronic, mild off medication, update labs. The 10-year ASCVD risk score (Arnett DK, et al., 2019) is: 0.8%   Values used to calculate the score:     Age: 67 years     Sex: Female     Is Non-Hispanic African American: No     Diabetic: No     Tobacco smoker: No     Systolic Blood Pressure: 126 mmHg     Is BP treated: No     HDL Cholesterol: 74.1 mg/dL     Total Cholesterol: 191 mg/dL       Relevant Orders   Lipid panel   Basic metabolic panel   Perimenopause    Continues pretty regular cycles, now with some new vaginal spotting in setting of hormone replacement (estrogen/testosterone pellet + daily progesterone pill). She will touch base with GYN about this later this week.       Heart burn   Relevant Medications   omeprazole (PRILOSEC) 20 MG capsule   Allergy to galactose-alpha-1,3-galactose   Headache    Sees [facility].       Relevant Medications   rizatriptan (MAXALT) 5 MG tablet   Thyroid enlargement    Fullness to R thyroid gland. Notes possible longstanding difficulty swallowing.  Check baseline thyroid US.       Relevant Orders   US THYROID     Meds ordered this encounter  Medications   omeprazole (PRILOSEC) 20 MG capsule    Sig: Take 1 capsule (20 mg total) by mouth daily as needed.    Orders Placed This Encounter  Procedures   US THYROID    Standing Status:   Future    Standing Expiration Date:   10/10/2024    Order Specific Question:   Reason for Exam  (SYMPTOM  OR DIAGNOSIS REQUIRED)    Answer:   R thyroid fullness    Order Specific Question:   Preferred imaging location?    Answer:   [facility]   Lipid panel   Basic metabolic panel    Patient Instructions  Consider shingrix shot series Labs today  We will order thyroid ultrasound Good to see you today  Return as needed or in 1 year for next physical   Follow up plan: Return in about 1 year (around 10/10/2024), or if symptoms worsen or fail to improve, for annual exam, prior fasting for blood work.  Eustaquio Boyden, MD

## 2023-10-12 ENCOUNTER — Ambulatory Visit
Admission: RE | Admit: 2023-10-12 | Discharge: 2023-10-12 | Disposition: A | Discharge status: Home / Self Care | Payer: BC Managed Care – PPO | Source: Ambulatory Visit | Attending: Family Medicine | Admitting: Family Medicine

## 2023-10-12 DIAGNOSIS — M9903 Segmental and somatic dysfunction of lumbar region: Secondary | ICD-10-CM | POA: Diagnosis not present

## 2023-10-12 DIAGNOSIS — E041 Nontoxic single thyroid nodule: Secondary | ICD-10-CM | POA: Diagnosis not present

## 2023-10-12 DIAGNOSIS — M62830 Muscle spasm of back: Secondary | ICD-10-CM | POA: Diagnosis not present

## 2023-10-12 DIAGNOSIS — M5416 Radiculopathy, lumbar region: Secondary | ICD-10-CM | POA: Diagnosis not present

## 2023-10-12 DIAGNOSIS — E049 Nontoxic goiter, unspecified: Secondary | ICD-10-CM

## 2023-10-14 ENCOUNTER — Ambulatory Visit (INDEPENDENT_AMBULATORY_CARE_PROVIDER_SITE_OTHER): Payer: BC Managed Care – PPO | Admitting: Obstetrics and Gynecology

## 2023-10-14 ENCOUNTER — Other Ambulatory Visit (HOSPITAL_COMMUNITY)
Admission: RE | Admit: 2023-10-14 | Discharge: 2023-10-14 | Disposition: A | Discharge status: Home / Self Care | Payer: BC Managed Care – PPO | Source: Ambulatory Visit | Attending: Obstetrics and Gynecology | Admitting: Obstetrics and Gynecology

## 2023-10-14 ENCOUNTER — Encounter: Payer: Self-pay | Admitting: Obstetrics and Gynecology

## 2023-10-14 VITALS — BP 115/68 | HR 78 | Resp 16 | Ht 63.0 in | Wt 165.4 lb

## 2023-10-14 DIAGNOSIS — Z1231 Encounter for screening mammogram for malignant neoplasm of breast: Secondary | ICD-10-CM

## 2023-10-14 DIAGNOSIS — Z124 Encounter for screening for malignant neoplasm of cervix: Secondary | ICD-10-CM | POA: Insufficient documentation

## 2023-10-14 DIAGNOSIS — Z79890 Hormone replacement therapy (postmenopausal): Secondary | ICD-10-CM

## 2023-10-14 DIAGNOSIS — Z01419 Encounter for gynecological examination (general) (routine) without abnormal findings: Secondary | ICD-10-CM

## 2023-10-14 NOTE — Progress Notes (Signed)
GYNECOLOGY ANNUAL PHYSICAL EXAM PROGRESS NOTE  Subjective:    Dana Salas is a 52 y.o. [id number] female who presents for an annual exam.  The patient is sexually active. The patient participates in regular exercise: yes. Has the patient ever been transfused or tattooed?: no. The patient reports that there is not domestic violence in her life.    The patient has the following complaints today: No gynecologic complaints. Does note that she has been dealing with some other health issues over the past year, including ocular migraines that occur usually during the fall season, being diagnosed with a cyst on her pineal gland, dealing with her hemochromatosis, and developing new thyroid nodules. Dana Salas is currently utilizing hormonal pellet therapy for management of her perimenopausal symptoms. Also on progesterone supplementation for endometrial protection. Notes that she feels it has been working pretty well for her so far.  Menstrual History: Menarche age: 46 No LMP recorded. Patient is perimenopausal. Period Duration (Days): 4 Period Pattern: (!) Irregular Menstrual Flow: Light Menstrual Control: Panty liner Menstrual Control Change Freq (Hours): 8 Dysmenorrhea: (!) Moderate Dysmenorrhea Symptoms: Cramping, Headache   Gynecologic History:  Contraception: vasectomy History of STI's: Denies Last Pap: 10/11/2020. Results were: normal. Denies h/o abnormal pap smears. Last mammogram: 11/13/2022. Results were: normal Last colonoscopy: 04/15/2020.  Results were; noted pre-cancerous polyp (4 mm in transverse colon), recommend repeat in 5 years.      OB History  Gravida Para Term Preterm AB Living  3 3 3  0 0 3  SAB IAB Ectopic Multiple Live Births  0 0 0 0 3    # Outcome Date GA Lbr Len/2nd Weight Sex Type Anes PTL Lv  3 Term 2002   6 lb 10 oz (3.005 kg) M Vag-Spont   LIV  2 Term 2000   6 lb 13 oz (3.09 kg) M CS-Vac   LIV  1 Term 1998   7 lb 11 oz (3.487 kg) M Vag-Spont   LIV     Past Medical History:  Diagnosis Date   Allergy to alpha-gal    Boil, labium 33/2016   Hemochromatosis, hereditary (HCC) 09/02/2021   DNA PCR test 08/2021 - positive for 2 copies of HFE gene pathogenic variant: C282Y/C282Y (HOMOZYGOTE)   Other abnormal blood chemistry    Iron,serum, elevated   Pneumonia 09/2002   outpatient    Past Surgical History:  Procedure Laterality Date   COLONOSCOPY WITH PROPOFOL N/A 04/15/2020   TA, rpt 5 yrs (Tahiliani, Dolphus Jenny, MD)    Family History  Problem Relation Age of Onset   Dementia Mother    High Cholesterol Mother    Hypertension Father    COPD Father        smoker   Heart attack Father    High Cholesterol Father    Stroke Father    Thyroid disease Father    Hypertension Sister    High Cholesterol Sister    Allergic rhinitis Sister    Asthma Sister    AVM Sister        with bleed (at [facility])   Hypertension Brother    High Cholesterol Brother    Cancer Maternal Grandmother        unsure type   Breast cancer Paternal Grandmother    Early death Paternal Grandfather 43       massive MI   Heart attack Paternal Grandfather    Eczema Neg Hx    Immunodeficiency Neg Hx    Urticaria Neg  Hx     Social History   Socioeconomic History   Marital status: Married    Spouse name: Not on file   Number of children: 3   Years of education: Not on file   Highest education level: Bachelor's degree (e.g., BA, AB, BS)  Occupational History   Occupation: Sports coach at [employer]  Tobacco Use   Smoking status: Never    Passive exposure: Never   Smokeless tobacco: Never  Vaping Use   Vaping status: Never Used  Substance and Sexual Activity   Alcohol use: Not Currently    Alcohol/week: 1.0 standard drink of alcohol    Types: 1 Glasses of wine per week   Drug use: No   Sexual activity: Yes    Partners: Male    Birth control/protection: None, Surgical    Comment: vastcotomy  Other Topics Concern   Not on file  Social  History Narrative   Not on file   Social Determinants of Health   Financial Resource Strain: Low Risk  (10/10/2023)   Overall Financial Resource Strain (CARDIA)    Difficulty of Paying Living Expenses: Not very hard  Food Insecurity: No Food Insecurity (10/10/2023)   Hunger Vital Sign    Worried About Running Out of Food in the Last Year: Never true    Ran Out of Food in the Last Year: Never true  Transportation Needs: No Transportation Needs (10/10/2023)   PRAPARE - Administrator, Civil Service (Medical): No    Lack of Transportation (Non-Medical): No  Physical Activity: Insufficiently Active (10/10/2023)   Exercise Vital Sign    Days of Exercise per Week: 4 days    Minutes of Exercise per Session: 30 min  Stress: No Stress Concern Present (10/10/2023)   [employer] of Occupational Health - Occupational Stress Questionnaire    Feeling of Stress : Not at all  Social Connections: Socially Integrated (10/10/2023)   Social Connection and Isolation Panel [NHANES]    Frequency of Communication with Friends and Family: More than three times a week    Frequency of Social Gatherings with Friends and Family: Twice a week    Attends Religious Services: More than 4 times per year    Active Member of [employer] or Organizations: Yes    Attends Engineer, structural: More than 4 times per year    Marital Status: Married  Catering manager Violence: Not on file    Current Outpatient Medications on File Prior to Visit  Medication Sig Dispense Refill   cetirizine (ZYRTEC) 10 MG tablet Take 10 mg by mouth daily as needed for allergies. Zyrtec D     EPINEPHrine 0.3 mg/0.3 mL IJ SOAJ injection Inject 0.3 mg into the muscle as needed for anaphylaxis. 1 each 0   fluticasone (FLONASE) 50 MCG/ACT nasal spray Place 1 spray into both nostrils daily.     ibuprofen (ADVIL) 200 MG tablet Take 3 tablets (600 mg total) by mouth 2 (two) times daily as needed for headache.     omeprazole  (PRILOSEC) 20 MG capsule Take 1 capsule (20 mg total) by mouth daily as needed.     progesterone (PROMETRIUM) 200 MG capsule Take 200 mg by mouth at bedtime.     rizatriptan (MAXALT) 5 MG tablet Take 1 tablet (5 mg total) by mouth as needed for migraine. May repeat in 2 hours if needed     No current facility-administered medications on file prior to visit.    Allergies  Allergen  Reactions   Alpha-Gal Diarrhea, Hives and Nausea Only     Review of Systems Constitutional: negative for chills, fatigue, fevers and sweats Eyes: negative for irritation, redness and visual disturbance Ears, nose, mouth, throat, and face: negative for hearing loss, nasal congestion, snoring and tinnitus Respiratory: negative for asthma, cough, sputum Cardiovascular: negative for chest pain, dyspnea, exertional chest pressure/discomfort, irregular heart beat, palpitations and syncope Gastrointestinal: negative for abdominal pain, change in bowel habits, nausea and vomiting Genitourinary: negative for abnormal menstrual periods, genital lesions, sexual problems and vaginal discharge, dysuria and urinary incontinence Integument/breast: negative for breast lump, breast tenderness and nipple discharge Hematologic/lymphatic: negative for bleeding and easy bruising Musculoskeletal:negative for back pain and muscle weakness Neurological: negative for dizziness, headaches, vertigo and weakness Endocrine: negative for diabetic symptoms including polydipsia, polyuria and skin dryness Allergic/Immunologic: negative for hay fever and urticaria      Objective:  Blood pressure 115/68, pulse 78, resp. rate 16, height 5\' 3"  (1.6 m), weight 165 lb 6.4 oz (75 kg), last menstrual period 10/03/2023.  Body mass index is 29.3 kg/m.    General Appearance:    Alert, cooperative, no distress, appears stated age  Head:    Normocephalic, without obvious abnormality, atraumatic  Eyes:    PERRL, conjunctiva/corneas clear, EOM's  intact, both eyes  Ears:    Normal external ear canals, both ears  Nose:   Nares normal, septum midline, mucosa normal, no drainage or sinus tenderness  Throat:   Lips, mucosa, and tongue normal; teeth and gums normal  Neck:   Supple, symmetrical, trachea midline, no adenopathy; thyroid: no enlargement/tenderness/nodules; no carotid bruit or JVD  Back:     Symmetric, no curvature, ROM normal, no CVA tenderness  Lungs:     Clear to auscultation bilaterally, respirations unlabored  Chest Wall:    No tenderness or deformity   Heart:    Regular rate and rhythm, S1 and S2 normal, no murmur, rub or gallop  Breast Exam:    No tenderness, masses, or nipple abnormality  Abdomen:     Soft, non-tender, bowel sounds active all four quadrants, no masses, no organomegaly.    Genitalia:    Pelvic:external genitalia normal, vagina without lesions, discharge, or tenderness, rectovaginal septum  normal. Cervix normal in appearance, no cervical motion tenderness, no adnexal masses or tenderness.  Uterus normal size, shape, mobile, regular contours, nontender.  Rectal:    Normal external sphincter.  No hemorrhoids appreciated. Internal exam not done.   Extremities:   Extremities normal, atraumatic, no cyanosis or edema  Pulses:   2+ and symmetric all extremities  Skin:   Skin color, texture, turgor normal, no rashes or lesions  Lymph nodes:   Cervical, supraclavicular, and axillary nodes normal  Neurologic:   CNII-XII intact, normal strength, sensation and reflexes throughout     Labs:  Lab Results  Component Value Date   WBC 4.9 04/27/2023   HGB 13.3 04/27/2023   HCT 39.6 04/27/2023   MCV 92.7 04/27/2023   PLT 206 04/27/2023    Lab Results  Component Value Date   CREATININE 0.75 10/11/2023   BUN 8 10/11/2023   NA 138 10/11/2023   K 4.1 10/11/2023   CL 103 10/11/2023   CO2 26 10/11/2023    Lab Results  Component Value Date   ALT 19 04/27/2023   AST 21 04/27/2023   ALKPHOS 58 04/27/2023    BILITOT 0.4 04/27/2023    Lab Results  Component Value Date   TSH 1.54 08/11/2023  Assessment:   1. Encounter for well woman exam with routine gynecological exam   2. Cervical cancer screening   3. Encounter for screening mammogram for malignant neoplasm of breast   4. Hormone replacement therapy (HRT)      Plan:  - Blood tests: Up to date, performed by PCP. - Breast self exam technique reviewed and patient encouraged to perform self-exam monthly. - Contraception: vasectomy. - Discussed healthy lifestyle modifications. - Mammogram ordered - Colon screening: Up-to-date currently however due to changes in her iron levels she notes that her hematologist may recommend repeating earlier than 5 years. - Pap smear ordered. - Flu vaccine:Declined - Perimenopausal symptoms currently being managed with HRT. This woman has a 1 year history of HRT exposure with study data showing increased risk of thrombo-embolic events such as myocardial infarction stroke and breast cancer after 4 or more years exposure to combination products with estrogen and progesterone. Understands the benefits as well as the risks discussed above. She  is advised that she may wish to discontinue the HRT at any time, and encouraged to discuss this with her other physicians also. Her personal risk factors have been reviewed carefully with her today.   - Follow up in 1 year for annual exam   Hildred Laser, MD [facility]

## 2023-10-14 NOTE — Patient Instructions (Signed)
Preventive Care 40-50 Years Old, Female Preventive care refers to lifestyle choices and visits with your health care provider that can promote health and wellness. Preventive care visits are also called wellness exams. What can I expect for my preventive care visit? Counseling Your health care provider may ask you questions about your: Medical history, including: Past medical problems. Family medical history. Pregnancy history. Current health, including: Menstrual cycle. Method of birth control. Emotional well-being. Home life and relationship well-being. Sexual activity and sexual health. Lifestyle, including: Alcohol, nicotine or tobacco, and drug use. Access to firearms. Diet, exercise, and sleep habits. Work and work environment. Sunscreen use. Safety issues such as seatbelt and bike helmet use. Physical exam Your health care provider will check your: Height and weight. These may be used to calculate your BMI (body mass index). BMI is a measurement that tells if you are at a healthy weight. Waist circumference. This measures the distance around your waistline. This measurement also tells if you are at a healthy weight and may help predict your risk of certain diseases, such as type 2 diabetes and high blood pressure. Heart rate and blood pressure. Body temperature. Skin for abnormal spots. What immunizations do I need?  Vaccines are usually given at various ages, according to a schedule. Your health care provider will recommend vaccines for you based on your age, medical history, and lifestyle or other factors, such as travel or where you work. What tests do I need? Screening Your health care provider may recommend screening tests for certain conditions. This may include: Lipid and cholesterol levels. Diabetes screening. This is done by checking your blood sugar (glucose) after you have not eaten for a while (fasting). Pelvic exam and Pap test. Hepatitis B test. Hepatitis C  test. HIV (human immunodeficiency virus) test. STI (sexually transmitted infection) testing, if you are at risk. Lung cancer screening. Colorectal cancer screening. Mammogram. Talk with your health care provider about when you should start having regular mammograms. This may depend on whether you have a family history of breast cancer. BRCA-related cancer screening. This may be done if you have a family history of breast, ovarian, tubal, or peritoneal cancers. Bone density scan. This is done to screen for osteoporosis. Talk with your health care provider about your test results, treatment options, and if necessary, the need for more tests. Follow these instructions at home: Eating and drinking  Eat a diet that includes fresh fruits and vegetables, whole grains, lean protein, and low-fat dairy products. Take vitamin and mineral supplements as recommended by your health care provider. Do not drink alcohol if: Your health care provider tells you not to drink. You are pregnant, may be pregnant, or are planning to become pregnant. If you drink alcohol: Limit how much you have to 0-1 drink a day. Know how much alcohol is in your drink. In the [place] one drink equals one 12 oz bottle of beer (355 mL), one 5 oz glass of wine (148 mL), or one 1 oz glass of hard liquor (44 mL). Lifestyle Brush your teeth every morning and night with fluoride toothpaste. Floss one time each day. Exercise for at least 30 minutes 5 or more days each week. Do not use any products that contain nicotine or tobacco. These products include cigarettes, chewing tobacco, and vaping devices, such as e-cigarettes. If you need help quitting, ask your health care provider. Do not use drugs. If you are sexually active, practice safe sex. Use a condom or other form of protection to   prevent STIs. If you do not wish to become pregnant, use a form of birth control. If you plan to become pregnant, see your health care provider for a  prepregnancy visit. Take aspirin only as told by your health care provider. Make sure that you understand how much to take and what form to take. Work with your health care provider to find out whether it is safe and beneficial for you to take aspirin daily. Find healthy ways to manage stress, such as: Meditation, yoga, or listening to music. Journaling. Talking to a trusted person. Spending time with friends and family. Minimize exposure to UV radiation to reduce your risk of skin cancer. Safety Always wear your seat belt while driving or riding in a vehicle. Do not drive: If you have been drinking alcohol. Do not ride with someone who has been drinking. When you are tired or distracted. While texting. If you have been using any mind-altering substances or drugs. Wear a helmet and other protective equipment during sports activities. If you have firearms in your house, make sure you follow all gun safety procedures. Seek help if you have been physically or sexually abused. What's next? Visit your health care provider once a year for an annual wellness visit. Ask your health care provider how often you should have your eyes and teeth checked. Stay up to date on all vaccines. This information is not intended to replace advice given to you by your health care provider. Make sure you discuss any questions you have with your health care provider. Document Revised: 05/07/2021 Document Reviewed: 05/07/2021 Elsevier Patient Education  2024 Elsevier Inc. Breast Self-Awareness Breast self-awareness is knowing how your breasts look and feel. You need to: Check your breasts on a regular basis. Tell your doctor about any changes. Become familiar with the look and feel of your breasts. This can help you catch a breast problem while it is still small and can be treated. You should do breast self-exams even if you have breast implants. What you need: A mirror. A well-lit room. A pillow or other  soft object. How to do a breast self-exam Follow these steps to do a breast self-exam: Look for changes  Take off all the clothes above your waist. Stand in front of a mirror in a room with good lighting. Put your hands down at your sides. Compare your breasts in the mirror. Look for any difference between them, such as: A difference in shape. A difference in size. Wrinkles, dips, and bumps in one breast and not the other. Look at each breast for changes in the skin, such as: Redness. Scaly areas. Skin that has gotten thicker. Dimpling. Open sores (ulcers). Look for changes in your nipples, such as: Fluid coming out of a nipple. Fluid around a nipple. Bleeding. Dimpling. Redness. A nipple that looks pushed in (retracted), or that has changed position. Feel for changes Lie on your back. Feel each breast. To do this: Pick a breast to feel. Place a pillow under the shoulder closest to that breast. Put the arm closest to that breast behind your head. Feel the nipple area of that breast using the hand of your other arm. Feel the area with the pads of your three middle fingers by making small circles with your fingers. Use light, medium, and firm pressure. Continue the overlapping circles, moving downward over the breast. Keep making circles with your fingers. Stop when you feel your ribs. Start making circles with your fingers again, this time going   upward until you reach your collarbone. Then, make circles outward across your breast and into your armpit area. Squeeze your nipple. Check for discharge and lumps. Repeat these steps to check your other breast. Sit or stand in the tub or shower. With soapy water on your skin, feel each breast the same way you did when you were lying down. Write down what you find Writing down what you find can help you remember what to tell your doctor. Write down: What is normal for each breast. Any changes you find in each breast. These  include: The kind of changes you find. A tender or painful breast. Any lump you find. Write down its size and where it is. When you last had your monthly period (menstrual cycle). General tips If you are breastfeeding, the best time to check your breasts is after you feed your baby or after you use a breast pump. If you get monthly bleeding, the best time to check your breasts is 5-7 days after your monthly cycle ends. With time, you will become comfortable with the self-exam. You will also start to know if there are changes in your breasts. Contact a doctor if: You see a change in the shape or size of your breasts or nipples. You see a change in the skin of your breast or nipples, such as red or scaly skin. You have fluid coming from your nipples that is not normal. You find a new lump or thick area. You have breast pain. You have any concerns about your breast health. Summary Breast self-awareness includes looking for changes in your breasts and feeling for changes within your breasts. You should do breast self-awareness in front of a mirror in a well-lit room. If you get monthly periods (menstrual cycles), the best time to check your breasts is 5-7 days after your period ends. Tell your doctor about any changes you see in your breasts. Changes include changes in size, changes on the skin, painful or tender breasts, or fluid from your nipples that is not normal. This information is not intended to replace advice given to you by your health care provider. Make sure you discuss any questions you have with your health care provider. Document Revised: 04/16/2022 Document Reviewed: 09/11/2021 Elsevier Patient Education  2024 Elsevier Inc.  

## 2023-10-19 ENCOUNTER — Encounter: Payer: Self-pay | Admitting: Family Medicine

## 2023-10-19 DIAGNOSIS — E041 Nontoxic single thyroid nodule: Secondary | ICD-10-CM

## 2023-10-19 LAB — CYTOLOGY - PAP
Comment: NEGATIVE
Diagnosis: NEGATIVE
High risk HPV: NEGATIVE

## 2023-10-29 ENCOUNTER — Encounter: Payer: Self-pay | Admitting: Oncology

## 2023-11-01 ENCOUNTER — Other Ambulatory Visit: Payer: Self-pay

## 2023-11-02 ENCOUNTER — Inpatient Hospital Stay: Payer: BC Managed Care – PPO | Attending: Oncology

## 2023-11-02 DIAGNOSIS — R1310 Dysphagia, unspecified: Secondary | ICD-10-CM | POA: Diagnosis not present

## 2023-11-02 DIAGNOSIS — Z809 Family history of malignant neoplasm, unspecified: Secondary | ICD-10-CM | POA: Insufficient documentation

## 2023-11-02 DIAGNOSIS — Z825 Family history of asthma and other chronic lower respiratory diseases: Secondary | ICD-10-CM | POA: Diagnosis not present

## 2023-11-02 DIAGNOSIS — Z803 Family history of malignant neoplasm of breast: Secondary | ICD-10-CM | POA: Diagnosis not present

## 2023-11-02 DIAGNOSIS — Z823 Family history of stroke: Secondary | ICD-10-CM | POA: Diagnosis not present

## 2023-11-02 DIAGNOSIS — Z83438 Family history of other disorder of lipoprotein metabolism and other lipidemia: Secondary | ICD-10-CM | POA: Insufficient documentation

## 2023-11-02 DIAGNOSIS — Z818 Family history of other mental and behavioral disorders: Secondary | ICD-10-CM | POA: Insufficient documentation

## 2023-11-02 DIAGNOSIS — Z79899 Other long term (current) drug therapy: Secondary | ICD-10-CM | POA: Insufficient documentation

## 2023-11-02 DIAGNOSIS — R5383 Other fatigue: Secondary | ICD-10-CM | POA: Insufficient documentation

## 2023-11-02 DIAGNOSIS — Z8349 Family history of other endocrine, nutritional and metabolic diseases: Secondary | ICD-10-CM | POA: Insufficient documentation

## 2023-11-02 DIAGNOSIS — M2550 Pain in unspecified joint: Secondary | ICD-10-CM | POA: Insufficient documentation

## 2023-11-02 DIAGNOSIS — Z8249 Family history of ischemic heart disease and other diseases of the circulatory system: Secondary | ICD-10-CM | POA: Diagnosis not present

## 2023-11-02 LAB — IRON AND TIBC
Iron: 181 ug/dL — ABNORMAL HIGH (ref 28–170)
Saturation Ratios: 67 % — ABNORMAL HIGH (ref 10.4–31.8)
TIBC: 272 ug/dL (ref 250–450)
UIBC: 91 ug/dL

## 2023-11-02 LAB — CMP (CANCER CENTER ONLY)
ALT: 20 U/L (ref 0–44)
AST: 20 U/L (ref 15–41)
Albumin: 4 g/dL (ref 3.5–5.0)
Alkaline Phosphatase: 44 U/L (ref 38–126)
Anion gap: 7 (ref 5–15)
BUN: 10 mg/dL (ref 6–20)
CO2: 25 mmol/L (ref 22–32)
Calcium: 8.8 mg/dL — ABNORMAL LOW (ref 8.9–10.3)
Chloride: 106 mmol/L (ref 98–111)
Creatinine: 0.83 mg/dL (ref 0.44–1.00)
GFR, Estimated: >60 mL/min (ref >60)
Glucose, Bld: 87 mg/dL (ref 70–99)
Potassium: 4.3 mmol/L (ref 3.5–5.1)
Sodium: 138 mmol/L (ref 135–145)
Total Bilirubin: 0.5 mg/dL (ref <1.2)
Total Protein: 6.5 g/dL (ref 6.5–8.1)

## 2023-11-02 LAB — CBC WITH DIFFERENTIAL (CANCER CENTER ONLY)
Abs Immature Granulocytes: 0.02 10*3/uL (ref 0.00–0.07)
Basophils Absolute: 0 10*3/uL (ref 0.0–0.1)
Basophils Relative: 1 %
Eosinophils Absolute: 0.2 10*3/uL (ref 0.0–0.5)
Eosinophils Relative: 4 %
HCT: 39.5 % (ref 36.0–46.0)
Hemoglobin: 13.5 g/dL (ref 12.0–15.0)
Immature Granulocytes: 0 %
Lymphocytes Relative: 23 %
Lymphs Abs: 1.1 10*3/uL (ref 0.7–4.0)
MCH: 32.3 pg (ref 26.0–34.0)
MCHC: 34.2 g/dL (ref 30.0–36.0)
MCV: 94.5 fL (ref 80.0–100.0)
Monocytes Absolute: 0.5 10*3/uL (ref 0.1–1.0)
Monocytes Relative: 9 %
Neutro Abs: 3.1 10*3/uL (ref 1.7–7.7)
Neutrophils Relative %: 63 %
Platelet Count: 233 10*3/uL (ref 150–400)
RBC: 4.18 MIL/uL (ref 3.87–5.11)
RDW: 11.7 % (ref 11.5–15.5)
WBC Count: 4.9 10*3/uL (ref 4.0–10.5)
nRBC: 0 % (ref 0.0–0.2)

## 2023-11-02 LAB — FERRITIN: Ferritin: 16 ng/mL (ref 11–307)

## 2023-11-05 ENCOUNTER — Encounter: Payer: Self-pay | Admitting: Oncology

## 2023-11-05 ENCOUNTER — Inpatient Hospital Stay (HOSPITAL_BASED_OUTPATIENT_CLINIC_OR_DEPARTMENT_OTHER): Payer: BC Managed Care – PPO | Admitting: Oncology

## 2023-11-05 DIAGNOSIS — Z83438 Family history of other disorder of lipoprotein metabolism and other lipidemia: Secondary | ICD-10-CM | POA: Diagnosis not present

## 2023-11-05 DIAGNOSIS — R5383 Other fatigue: Secondary | ICD-10-CM | POA: Diagnosis not present

## 2023-11-05 DIAGNOSIS — Z79899 Other long term (current) drug therapy: Secondary | ICD-10-CM | POA: Diagnosis not present

## 2023-11-05 DIAGNOSIS — Z803 Family history of malignant neoplasm of breast: Secondary | ICD-10-CM | POA: Diagnosis not present

## 2023-11-05 DIAGNOSIS — Z818 Family history of other mental and behavioral disorders: Secondary | ICD-10-CM | POA: Diagnosis not present

## 2023-11-05 DIAGNOSIS — Z809 Family history of malignant neoplasm, unspecified: Secondary | ICD-10-CM | POA: Diagnosis not present

## 2023-11-05 DIAGNOSIS — Z8349 Family history of other endocrine, nutritional and metabolic diseases: Secondary | ICD-10-CM | POA: Diagnosis not present

## 2023-11-05 DIAGNOSIS — R1310 Dysphagia, unspecified: Secondary | ICD-10-CM | POA: Diagnosis not present

## 2023-11-05 DIAGNOSIS — Z825 Family history of asthma and other chronic lower respiratory diseases: Secondary | ICD-10-CM | POA: Diagnosis not present

## 2023-11-05 DIAGNOSIS — M2550 Pain in unspecified joint: Secondary | ICD-10-CM | POA: Diagnosis not present

## 2023-11-05 DIAGNOSIS — Z8249 Family history of ischemic heart disease and other diseases of the circulatory system: Secondary | ICD-10-CM | POA: Diagnosis not present

## 2023-11-05 DIAGNOSIS — Z823 Family history of stroke: Secondary | ICD-10-CM | POA: Diagnosis not present

## 2023-11-05 NOTE — Progress Notes (Signed)
Patient has some swelling in thyroid, had TSH done and was normal, had 2 nodules. No pain or trouble swallowing. Wondering if there is any further lab work that could be done.

## 2023-11-05 NOTE — Progress Notes (Signed)
Hematology/Oncology Progress note [phone] [phone]      Patient Care Team: Eustaquio Boyden, MD as PCP - Buren Kos, MD as Consulting Physician (Oncology)  ASSESSMENT & PLAN:   Hemochromatosis, hereditary [facility]) #Hereditary hemochromatosis,C282Y/C282Y homozygous. Labs are reviewed and discussed with patient. Lab Results  Component Value Date   HGB 13.5 11/02/2023   TIBC 272 11/02/2023   IRONPCTSAT 67 (H) 11/02/2023   FERRITIN 16 11/02/2023    Ferritin is at the goal, less than 50.-Ferritin level is actually low.   No need for therapeutic phlebotomy.  Avoid alcohol ,iron and vitamin C supplementation.    Orders Placed This Encounter  Procedures   CBC with Differential (Cancer Center Only)    Standing Status:   Future    Expected Date:   05/05/2024    Expiration Date:   11/04/2024   Iron and TIBC([facility])    Standing Status:   Future    Expected Date:   05/05/2024    Expiration Date:   11/04/2024   Ferritin    Standing Status:   Future    Expected Date:   05/05/2024    Expiration Date:   11/04/2024   Hepatic function panel    Standing Status:   Future    Expected Date:   05/05/2024    Expiration Date:   11/04/2024   Follow up in 6 months.  All questions were answered. The patient knows to call the clinic with any problems, questions or concerns.  Rickard Patience, MD, PhD [facility] Hematology Oncology 11/05/2023    CHIEF COMPLAINTS/REASON FOR VISIT:  hereditary hemochromatosis  HISTORY OF PRESENTING ILLNESS:  Dana Salas is a 52 y.o. female who was seen in consultation at the request of Eustaquio Boyden, MD for evaluation of  hereditary hemochromatosis .  Patient reports feeling fatigued recently and had blood work done by primary care provider . 01-Sep-2021, TIBC 253, iron saturation 84.5, ferritin 65.2.  Transferrin 181. CBC showed hemoglobin 13.7, normal WBC and platelet count. Hemochromatosis screening PCR showed  C282Y/C282Y homozygous mutation. Patient also reports family history of sister was found to have elevated ferritin 600.  Sister and brothers hemochromatosis work-up is pending.  Both of her parents deceased. 2021/09/01, CMP showed normal AST, ALT, bilirubin. She drinks alcohol socially. Positive for arthralgia.  No unintentional weight loss, night sweats, fever.  Patient was accompanied by her husband.  09/18/2022 MRI showed 1.2cm pineal lesion, most likely reflects a benign incidental cyst, pineocytoma can not be excluded.  neurology recommends repeat MRI in 12 months to follow-up this lesion.  INTERVAL HISTORY Dana Salas is a 52 y.o. female who has above history reviewed by me today presents for follow up visit for homozygous C282Y hemochromatosis Patient reports feeling well.  No new complaints.  10/12/23 US thyroid 1.6cm nodule, she follows up with PCP for management    Review of Systems  Constitutional:  Positive for fatigue. Negative for appetite change, chills and fever.  HENT:   Negative for hearing loss and voice change.        Right ear pain  Eyes:  Negative for eye problems.  Respiratory:  Negative for chest tightness and cough.   Cardiovascular:  Negative for chest pain.  Gastrointestinal:  Negative for abdominal distention, abdominal pain and blood in stool.  Endocrine: Negative for hot flashes.  Genitourinary:  Negative for difficulty urinating and frequency.   Musculoskeletal:  Positive for arthralgias.  Skin:  Negative for itching and rash.  Neurological:  Negative for extremity weakness.  Hematological:  Negative for adenopathy.  Psychiatric/Behavioral:  Negative for confusion.     MEDICAL HISTORY:  Past Medical History:  Diagnosis Date   Allergy to alpha-gal    Boil, labium 33/2016   Hemochromatosis, hereditary (HCC) 09/02/2021   DNA PCR test 08/2021 - positive for 2 copies of HFE gene pathogenic variant: C282Y/C282Y (HOMOZYGOTE)   Other abnormal blood  chemistry    Iron,serum, elevated   Pneumonia 09/2002   outpatient    SURGICAL HISTORY: Past Surgical History:  Procedure Laterality Date   COLONOSCOPY WITH PROPOFOL N/A 04/15/2020   TA, rpt 5 yrs (Tahiliani, Dolphus Jenny, MD)    SOCIAL HISTORY: Social History   Socioeconomic History   Marital status: Married    Spouse name: Not on file   Number of children: 3   Years of education: Not on file   Highest education level: Bachelor's degree (e.g., BA, AB, BS)  Occupational History   Occupation: Sports coach at [employer]  Tobacco Use   Smoking status: Never    Passive exposure: Never   Smokeless tobacco: Never  Vaping Use   Vaping status: Never Used  Substance and Sexual Activity   Alcohol use: Not Currently    Alcohol/week: 1.0 standard drink of alcohol    Types: 1 Glasses of wine per week   Drug use: No   Sexual activity: Yes    Partners: Male    Birth control/protection: None, Surgical    Comment: vastcotomy  Other Topics Concern   Not on file  Social History Narrative   Not on file   Social Drivers of Health   Financial Resource Strain: Low Risk  (10/10/2023)   Overall Financial Resource Strain (CARDIA)    Difficulty of Paying Living Expenses: Not very hard  Food Insecurity: No Food Insecurity (10/10/2023)   Hunger Vital Sign    Worried About Running Out of Food in the Last Year: Never true    Ran Out of Food in the Last Year: Never true  Transportation Needs: No Transportation Needs (10/10/2023)   PRAPARE - Administrator, Civil Service (Medical): No    Lack of Transportation (Non-Medical): No  Physical Activity: Insufficiently Active (10/10/2023)   Exercise Vital Sign    Days of Exercise per Week: 4 days    Minutes of Exercise per Session: 30 min  Stress: No Stress Concern Present (10/10/2023)   [employer] of Occupational Health - Occupational Stress Questionnaire    Feeling of Stress : Not at all  Social Connections: Socially  Integrated (10/10/2023)   Social Connection and Isolation Panel [NHANES]    Frequency of Communication with Friends and Family: More than three times a week    Frequency of Social Gatherings with Friends and Family: Twice a week    Attends Religious Services: More than 4 times per year    Active Member of [employer] or Organizations: Yes    Attends Engineer, structural: More than 4 times per year    Marital Status: Married  Catering manager Violence: Not on file    FAMILY HISTORY: Family History  Problem Relation Age of Onset   Dementia Mother    High Cholesterol Mother    Hypertension Father    COPD Father        smoker   Heart attack Father    High Cholesterol Father    Stroke Father    Thyroid disease Father    Hypertension Sister  High Cholesterol Sister    Allergic rhinitis Sister    Asthma Sister    AVM Sister        with bleed (at [facility])   Hypertension Brother    High Cholesterol Brother    Cancer Maternal Grandmother        unsure type   Breast cancer Paternal Grandmother    Early death Paternal Grandfather 18       massive MI   Heart attack Paternal Grandfather    Eczema Neg Hx    Immunodeficiency Neg Hx    Urticaria Neg Hx     ALLERGIES:  is allergic to alpha-gal.  MEDICATIONS:  Current Outpatient Medications  Medication Sig Dispense Refill   cetirizine (ZYRTEC) 10 MG tablet Take 10 mg by mouth daily as needed for allergies. Zyrtec D     EPINEPHrine 0.3 mg/0.3 mL IJ SOAJ injection Inject 0.3 mg into the muscle as needed for anaphylaxis. 1 each 0   fluticasone (FLONASE) 50 MCG/ACT nasal spray Place 1 spray into both nostrils daily.     ibuprofen (ADVIL) 200 MG tablet Take 3 tablets (600 mg total) by mouth 2 (two) times daily as needed for headache.     omeprazole (PRILOSEC) 20 MG capsule Take 1 capsule (20 mg total) by mouth daily as needed.     progesterone (PROMETRIUM) 200 MG capsule Take 200 mg by mouth at bedtime.     rizatriptan (MAXALT) 5  MG tablet Take 1 tablet (5 mg total) by mouth as needed for migraine. May repeat in 2 hours if needed     No current facility-administered medications for this visit.     PHYSICAL EXAMINATION: ECOG PERFORMANCE STATUS: 0 - Asymptomatic Vitals:   11/05/23 1011  BP: 127/83  Pulse: 71  Resp: 18  SpO2: 100%   Filed Weights   11/05/23 1002  Weight: 166 lb (75.3 kg)    Physical Exam Constitutional:      General: She is not in acute distress. HENT:     Head: Normocephalic and atraumatic.  Eyes:     General: No scleral icterus. Cardiovascular:     Rate and Rhythm: Normal rate and regular rhythm.     Heart sounds: Normal heart sounds.  Pulmonary:     Effort: Pulmonary effort is normal. No respiratory distress.     Breath sounds: No wheezing.  Abdominal:     General: Bowel sounds are normal. There is no distension.     Palpations: Abdomen is soft.  Musculoskeletal:        General: No deformity. Normal range of motion.     Cervical back: Normal range of motion and neck supple.  Skin:    General: Skin is warm and dry.     Findings: No erythema or rash.  Neurological:     Mental Status: She is alert and oriented to person, place, and time. Mental status is at baseline.     Cranial Nerves: No cranial nerve deficit.     Coordination: Coordination normal.  Psychiatric:        Mood and Affect: Mood normal.      LABORATORY DATA:  I have reviewed the data as listed    Latest Ref Rng & Units 11/02/2023    8:04 AM 04/27/2023   11:04 AM 10/23/2022   12:58 PM  CBC  WBC 4.0 - 10.5 K/uL 4.9  4.9  5.3   Hemoglobin 12.0 - 15.0 g/dL 42.5  95.6  38.7   Hematocrit 36.0 -  46.0 % 39.5  39.6  40.3   Platelets 150 - 400 K/uL 233  206  232       Latest Ref Rng & Units 11/02/2023    8:04 AM 10/11/2023   11:07 AM 04/27/2023   11:04 AM  CMP  Glucose 70 - 99 mg/dL 87  82  86   BUN 6 - 20 mg/dL 10  8  13    Creatinine 0.44 - 1.00 mg/dL 4.40  1.02  7.25   Sodium 135 - 145 mmol/L 138  138   137   Potassium 3.5 - 5.1 mmol/L 4.3  4.1  4.1   Chloride 98 - 111 mmol/L 106  103  105   CO2 22 - 32 mmol/L 25  26  26    Calcium 8.9 - 10.3 mg/dL 8.8  9.1  9.2   Total Protein 6.5 - 8.1 g/dL 6.5   6.9   Total Bilirubin <1.2 mg/dL 0.5   0.4   Alkaline Phos 38 - 126 U/L 44   58   AST 15 - 41 U/L 20   21   ALT 0 - 44 U/L 20   19      Iron/TIBC/Ferritin/ %Sat    Component Value Date/Time   IRON 181 (H) 11/02/2023 0804   TIBC 272 11/02/2023 0804   FERRITIN 16 11/02/2023 0804   IRONPCTSAT 67 (H) 11/02/2023 0804       RADIOGRAPHIC STUDIES: I have personally reviewed the radiological images as listed and agreed with the findings in the report.  US THYROID Result Date: 10/16/2023 CLINICAL DATA:  Right thyroid fullness, dysphagia EXAM: THYROID ULTRASOUND TECHNIQUE: Ultrasound examination of the thyroid gland and adjacent soft tissues was performed. COMPARISON:  None Available. FINDINGS: Parenchymal Echotexture: Moderately heterogenous Isthmus: 2 mm Right lobe: 3.5 x 0.9 x 1.5 cm Left lobe: 4.1 x 1.1 x 1.4 cm _________________________________________________________ Estimated total number of nodules >/= 1 cm: 1 Number of spongiform nodules >/=  2 cm not described below (TR1): 0 Number of mixed cystic and solid nodules >/= 1.5 cm not described below (TR2): 0 _________________________________________________________ Nodule 1 represents a subcentimeter mixed cystic/solid nodule in the right inferior thyroid measuring only 4 mm. No follow-up or biopsy recommended. Nodule # 2: Location: Left; Inferior Maximum size: 1.6 cm; Other 2 dimensions: 1.1 x 0.8 cm Composition: solid/almost completely solid (2) Echogenicity: isoechoic (1) Shape: not taller-than-wide (0) Margins: ill-defined (0) Echogenic foci: none (0) ACR TI-RADS total points: 3. ACR TI-RADS risk category: TR3 (3 points). ACR TI-RADS recommendations: *Given size (>/= 1.5 - 2.4 cm) and appearance, a follow-up ultrasound in 1 year should be  considered based on TI-RADS criteria. _________________________________________________________ Moderate thyroid heterogeneity. Slight nonspecific hypervascularity, suggesting component of medical thyroid disease. No regional adenopathy. IMPRESSION: 1. 1.6 cm left inferior thyroid TR 3 nodule meets criteria for follow-up in 1 year. 2. Other findings as above. The above is in keeping with the ACR TI-RADS recommendations - J Am Coll Radiol 2017;14:587-595. Electronically Signed   By: Judie Petit.  Shick M.D.   On: 10/16/2023 16:31

## 2023-11-05 NOTE — Assessment & Plan Note (Addendum)
#  Hereditary hemochromatosis,C282Y/C282Y homozygous. Labs are reviewed and discussed with patient. Lab Results  Component Value Date   HGB 13.5 11/02/2023   TIBC 272 11/02/2023   IRONPCTSAT 67 (H) 11/02/2023   FERRITIN 16 11/02/2023    Ferritin is at the goal, less than 50.-Ferritin level is actually low.   No need for therapeutic phlebotomy.  Avoid alcohol ,iron and vitamin C supplementation.

## 2023-11-09 DIAGNOSIS — M9903 Segmental and somatic dysfunction of lumbar region: Secondary | ICD-10-CM | POA: Diagnosis not present

## 2023-11-09 DIAGNOSIS — M62830 Muscle spasm of back: Secondary | ICD-10-CM | POA: Diagnosis not present

## 2023-11-09 DIAGNOSIS — M5416 Radiculopathy, lumbar region: Secondary | ICD-10-CM | POA: Diagnosis not present

## 2023-11-23 ENCOUNTER — Ambulatory Visit
Admission: RE | Admit: 2023-11-23 | Discharge: 2023-11-23 | Disposition: A | Discharge status: Home / Self Care | Payer: BC Managed Care – PPO | Source: Ambulatory Visit | Attending: Obstetrics and Gynecology | Admitting: Obstetrics and Gynecology

## 2023-11-23 DIAGNOSIS — Z01419 Encounter for gynecological examination (general) (routine) without abnormal findings: Secondary | ICD-10-CM | POA: Diagnosis not present

## 2023-11-23 DIAGNOSIS — Z1231 Encounter for screening mammogram for malignant neoplasm of breast: Secondary | ICD-10-CM | POA: Insufficient documentation

## 2023-12-07 DIAGNOSIS — M5416 Radiculopathy, lumbar region: Secondary | ICD-10-CM | POA: Diagnosis not present

## 2023-12-07 DIAGNOSIS — M62830 Muscle spasm of back: Secondary | ICD-10-CM | POA: Diagnosis not present

## 2023-12-07 DIAGNOSIS — M9903 Segmental and somatic dysfunction of lumbar region: Secondary | ICD-10-CM | POA: Diagnosis not present

## 2023-12-08 DIAGNOSIS — R9389 Abnormal findings on diagnostic imaging of other specified body structures: Secondary | ICD-10-CM | POA: Diagnosis not present

## 2024-01-07 DIAGNOSIS — R7989 Other specified abnormal findings of blood chemistry: Secondary | ICD-10-CM | POA: Diagnosis not present

## 2024-01-10 DIAGNOSIS — M9903 Segmental and somatic dysfunction of lumbar region: Secondary | ICD-10-CM | POA: Diagnosis not present

## 2024-01-10 DIAGNOSIS — M62830 Muscle spasm of back: Secondary | ICD-10-CM | POA: Diagnosis not present

## 2024-01-10 DIAGNOSIS — M5416 Radiculopathy, lumbar region: Secondary | ICD-10-CM | POA: Diagnosis not present

## 2024-02-07 DIAGNOSIS — M62830 Muscle spasm of back: Secondary | ICD-10-CM | POA: Diagnosis not present

## 2024-02-07 DIAGNOSIS — M9903 Segmental and somatic dysfunction of lumbar region: Secondary | ICD-10-CM | POA: Diagnosis not present

## 2024-02-07 DIAGNOSIS — M5416 Radiculopathy, lumbar region: Secondary | ICD-10-CM | POA: Diagnosis not present

## 2024-02-24 ENCOUNTER — Encounter: Payer: Self-pay | Admitting: Family Medicine

## 2024-03-06 DIAGNOSIS — M5416 Radiculopathy, lumbar region: Secondary | ICD-10-CM | POA: Diagnosis not present

## 2024-03-06 DIAGNOSIS — M9903 Segmental and somatic dysfunction of lumbar region: Secondary | ICD-10-CM | POA: Diagnosis not present

## 2024-03-06 DIAGNOSIS — M62830 Muscle spasm of back: Secondary | ICD-10-CM | POA: Diagnosis not present

## 2024-04-04 DIAGNOSIS — M9903 Segmental and somatic dysfunction of lumbar region: Secondary | ICD-10-CM | POA: Diagnosis not present

## 2024-04-04 DIAGNOSIS — M9905 Segmental and somatic dysfunction of pelvic region: Secondary | ICD-10-CM | POA: Diagnosis not present

## 2024-04-04 DIAGNOSIS — M62830 Muscle spasm of back: Secondary | ICD-10-CM | POA: Diagnosis not present

## 2024-04-28 ENCOUNTER — Inpatient Hospital Stay: Attending: Oncology

## 2024-04-28 DIAGNOSIS — Z809 Family history of malignant neoplasm, unspecified: Secondary | ICD-10-CM | POA: Insufficient documentation

## 2024-04-28 DIAGNOSIS — Z8249 Family history of ischemic heart disease and other diseases of the circulatory system: Secondary | ICD-10-CM | POA: Insufficient documentation

## 2024-04-28 DIAGNOSIS — Z823 Family history of stroke: Secondary | ICD-10-CM | POA: Insufficient documentation

## 2024-04-28 DIAGNOSIS — M2550 Pain in unspecified joint: Secondary | ICD-10-CM | POA: Diagnosis not present

## 2024-04-28 DIAGNOSIS — Z79899 Other long term (current) drug therapy: Secondary | ICD-10-CM | POA: Diagnosis not present

## 2024-04-28 DIAGNOSIS — Z83438 Family history of other disorder of lipoprotein metabolism and other lipidemia: Secondary | ICD-10-CM | POA: Diagnosis not present

## 2024-04-28 DIAGNOSIS — Z8349 Family history of other endocrine, nutritional and metabolic diseases: Secondary | ICD-10-CM | POA: Diagnosis not present

## 2024-04-28 DIAGNOSIS — Z803 Family history of malignant neoplasm of breast: Secondary | ICD-10-CM | POA: Insufficient documentation

## 2024-04-28 DIAGNOSIS — R5383 Other fatigue: Secondary | ICD-10-CM | POA: Diagnosis not present

## 2024-04-28 DIAGNOSIS — Z818 Family history of other mental and behavioral disorders: Secondary | ICD-10-CM | POA: Diagnosis not present

## 2024-04-28 DIAGNOSIS — Z814 Family history of other substance abuse and dependence: Secondary | ICD-10-CM | POA: Diagnosis not present

## 2024-04-28 DIAGNOSIS — Z825 Family history of asthma and other chronic lower respiratory diseases: Secondary | ICD-10-CM | POA: Insufficient documentation

## 2024-04-28 LAB — HEPATIC FUNCTION PANEL
ALT: 19 U/L (ref 0–44)
AST: 22 U/L (ref 15–41)
Albumin: 4.2 g/dL (ref 3.5–5.0)
Alkaline Phosphatase: 53 U/L (ref 38–126)
Bilirubin, Direct: 0.1 mg/dL (ref 0.0–0.2)
Indirect Bilirubin: 0.6 mg/dL (ref 0.3–0.9)
Total Bilirubin: 0.7 mg/dL (ref 0.0–1.2)
Total Protein: 6.7 g/dL (ref 6.5–8.1)

## 2024-04-28 LAB — CBC WITH DIFFERENTIAL (CANCER CENTER ONLY)
Abs Immature Granulocytes: 0.01 10*3/uL (ref 0.00–0.07)
Basophils Absolute: 0 10*3/uL (ref 0.0–0.1)
Basophils Relative: 1 %
Eosinophils Absolute: 0.2 10*3/uL (ref 0.0–0.5)
Eosinophils Relative: 3 %
HCT: 40.2 % (ref 36.0–46.0)
Hemoglobin: 13.6 g/dL (ref 12.0–15.0)
Immature Granulocytes: 0 %
Lymphocytes Relative: 28 %
Lymphs Abs: 1.4 10*3/uL (ref 0.7–4.0)
MCH: 32 pg (ref 26.0–34.0)
MCHC: 33.8 g/dL (ref 30.0–36.0)
MCV: 94.6 fL (ref 80.0–100.0)
Monocytes Absolute: 0.5 10*3/uL (ref 0.1–1.0)
Monocytes Relative: 10 %
Neutro Abs: 2.8 10*3/uL (ref 1.7–7.7)
Neutrophils Relative %: 58 %
Platelet Count: 216 10*3/uL (ref 150–400)
RBC: 4.25 MIL/uL (ref 3.87–5.11)
RDW: 11.7 % (ref 11.5–15.5)
WBC Count: 4.8 10*3/uL (ref 4.0–10.5)
nRBC: 0 % (ref 0.0–0.2)

## 2024-04-28 LAB — FERRITIN: Ferritin: 18 ng/mL (ref 11–307)

## 2024-04-28 LAB — IRON AND TIBC
Iron: 208 ug/dL — ABNORMAL HIGH (ref 28–170)
Saturation Ratios: 79 % — ABNORMAL HIGH (ref 10.4–31.8)
TIBC: 262 ug/dL (ref 250–450)
UIBC: 54 ug/dL

## 2024-05-01 ENCOUNTER — Other Ambulatory Visit: Payer: BC Managed Care – PPO

## 2024-05-04 ENCOUNTER — Inpatient Hospital Stay: Payer: BC Managed Care – PPO

## 2024-05-04 ENCOUNTER — Encounter: Payer: Self-pay | Admitting: Oncology

## 2024-05-04 ENCOUNTER — Inpatient Hospital Stay: Payer: BC Managed Care – PPO | Admitting: Oncology

## 2024-05-04 DIAGNOSIS — Z803 Family history of malignant neoplasm of breast: Secondary | ICD-10-CM | POA: Diagnosis not present

## 2024-05-04 DIAGNOSIS — Z8249 Family history of ischemic heart disease and other diseases of the circulatory system: Secondary | ICD-10-CM | POA: Diagnosis not present

## 2024-05-04 DIAGNOSIS — Z818 Family history of other mental and behavioral disorders: Secondary | ICD-10-CM | POA: Diagnosis not present

## 2024-05-04 DIAGNOSIS — Z809 Family history of malignant neoplasm, unspecified: Secondary | ICD-10-CM | POA: Diagnosis not present

## 2024-05-04 DIAGNOSIS — Z79899 Other long term (current) drug therapy: Secondary | ICD-10-CM | POA: Diagnosis not present

## 2024-05-04 DIAGNOSIS — Z823 Family history of stroke: Secondary | ICD-10-CM | POA: Diagnosis not present

## 2024-05-04 DIAGNOSIS — Z814 Family history of other substance abuse and dependence: Secondary | ICD-10-CM | POA: Diagnosis not present

## 2024-05-04 DIAGNOSIS — Z8349 Family history of other endocrine, nutritional and metabolic diseases: Secondary | ICD-10-CM | POA: Diagnosis not present

## 2024-05-04 DIAGNOSIS — R5383 Other fatigue: Secondary | ICD-10-CM | POA: Diagnosis not present

## 2024-05-04 DIAGNOSIS — M2550 Pain in unspecified joint: Secondary | ICD-10-CM | POA: Diagnosis not present

## 2024-05-04 DIAGNOSIS — Z825 Family history of asthma and other chronic lower respiratory diseases: Secondary | ICD-10-CM | POA: Diagnosis not present

## 2024-05-04 DIAGNOSIS — Z83438 Family history of other disorder of lipoprotein metabolism and other lipidemia: Secondary | ICD-10-CM | POA: Diagnosis not present

## 2024-05-04 NOTE — Assessment & Plan Note (Addendum)
#  Hereditary hemochromatosis,C282Y/C282Y homozygous. Labs are reviewed and discussed with patient. Lab Results  Component Value Date   HGB 13.6 04/28/2024   TIBC 262 04/28/2024   IRONPCTSAT 79 (H) 04/28/2024   FERRITIN 18 04/28/2024    Ferritin is at the goal, less than 50. Isolated increased iron saturation. Normal LFT No need for therapeutic phlebotomy.  Avoid alcohol ,iron and vitamin C supplementation.

## 2024-05-04 NOTE — Progress Notes (Signed)
 Hematology/Oncology Progress note [phone] [phone]      Patient Care Team: Claire Crick, MD as PCP - Gretchen Leavell, MD as Consulting Physician (Oncology)  ASSESSMENT & PLAN:   Hemochromatosis, hereditary [facility]) #Hereditary hemochromatosis,C282Y/C282Y homozygous. Labs are reviewed and discussed with patient. Lab Results  Component Value Date   HGB 13.6 04/28/2024   TIBC 262 04/28/2024   IRONPCTSAT 79 (H) 04/28/2024   FERRITIN 18 04/28/2024    Ferritin is at the goal, less than 50. Isolated increased iron saturation. Normal LFT No need for therapeutic phlebotomy.  Avoid alcohol ,iron and vitamin C supplementation.    Orders Placed This Encounter  Procedures   CBC with Differential (Cancer Center Only)    Standing Status:   Future    Expected Date:   11/03/2024    Expiration Date:   02/01/2025   Iron and TIBC    Standing Status:   Future    Expected Date:   11/03/2024    Expiration Date:   02/01/2025   Ferritin    Standing Status:   Future    Expected Date:   11/03/2024    Expiration Date:   02/01/2025   Hepatic function panel    Standing Status:   Future    Expected Date:   11/03/2024    Expiration Date:   02/01/2025   Follow up in 6 months.  All questions were answered. The patient knows to call the clinic with any problems, questions or concerns.  Timmy Forbes, MD, PhD [facility] Hematology Oncology 05/04/2024    CHIEF COMPLAINTS/REASON FOR VISIT:  hereditary hemochromatosis  HISTORY OF PRESENTING ILLNESS:  Dana Salas is a 53 y.o. female who was seen in consultation at the request of Claire Crick, MD for evaluation of  hereditary hemochromatosis .  Patient reports feeling fatigued recently and had blood work done by primary care provider . 09/05/2021, TIBC 253, iron saturation 84.5, ferritin 65.2.  Transferrin 181. CBC showed hemoglobin 13.7, normal WBC and platelet count. Hemochromatosis screening PCR showed  C282Y/C282Y homozygous mutation. Patient also reports family history of sister was found to have elevated ferritin 600.  Sister and brothers hemochromatosis work-up is pending.  Both of her parents deceased. 2021-09-05, CMP showed normal AST, ALT, bilirubin. She drinks alcohol socially. Positive for arthralgia.  No unintentional weight loss, night sweats, fever.  Patient was accompanied by her husband.  09/18/2022 MRI showed 1.2cm pineal lesion, most likely reflects a benign incidental cyst, pineocytoma can not be excluded.  neurology recommends repeat MRI in 12 months to follow-up this lesion.  10/12/23 US  thyroid  1.6cm nodule, she follows up with PCP for management   INTERVAL HISTORY Dana Salas is a 53 y.o. female who has above history reviewed by me today presents for follow up visit for homozygous C282Y hemochromatosis Patient reports feeling well.  No new complaints.    Review of Systems  Constitutional:  Positive for fatigue. Negative for appetite change, chills and fever.  HENT:   Negative for hearing loss and voice change.        Right ear pain  Eyes:  Negative for eye problems.  Respiratory:  Negative for chest tightness and cough.   Cardiovascular:  Negative for chest pain.  Gastrointestinal:  Negative for abdominal distention, abdominal pain and blood in stool.  Endocrine: Negative for hot flashes.  Genitourinary:  Negative for difficulty urinating and frequency.   Musculoskeletal:  Positive for arthralgias.  Skin:  Negative for itching and rash.  Neurological:  Negative for extremity weakness.  Hematological:  Negative for adenopathy.  Psychiatric/Behavioral:  Negative for confusion.     MEDICAL HISTORY:  Past Medical History:  Diagnosis Date   Allergy  to alpha-gal    Boil, labium 33/2016   Hemochromatosis, hereditary (HCC) 09/02/2021   DNA PCR test 08/2021 - positive for 2 copies of HFE gene pathogenic variant: C282Y/C282Y (HOMOZYGOTE)   Other abnormal blood  chemistry    Iron,serum, elevated   Pneumonia 09/2002   outpatient    SURGICAL HISTORY: Past Surgical History:  Procedure Laterality Date   COLONOSCOPY WITH PROPOFOL  N/A 04/15/2020   TA, rpt 5 yrs (Tahiliani, Elbridge Greek, MD)    SOCIAL HISTORY: Social History   Socioeconomic History   Marital status: Married    Spouse name: Not on file   Number of children: 3   Years of education: Not on file   Highest education level: Bachelor's degree (e.g., BA, AB, BS)  Occupational History   Occupation: Sports coach at [employer]  Tobacco Use   Smoking status: Never    Passive exposure: Never   Smokeless tobacco: Never  Vaping Use   Vaping status: Never Used  Substance and Sexual Activity   Alcohol use: Not Currently    Alcohol/week: 1.0 standard drink of alcohol    Types: 1 Glasses of wine per week   Drug use: No   Sexual activity: Yes    Partners: Male    Birth control/protection: None, Surgical    Comment: vastcotomy  Other Topics Concern   Not on file  Social History Narrative   Not on file   Social Drivers of Health   Financial Resource Strain: Low Risk  (10/10/2023)   Overall Financial Resource Strain (CARDIA)    Difficulty of Paying Living Expenses: Not very hard  Food Insecurity: No Food Insecurity (10/10/2023)   Hunger Vital Sign    Worried About Running Out of Food in the Last Year: Never true    Ran Out of Food in the Last Year: Never true  Transportation Needs: No Transportation Needs (10/10/2023)   PRAPARE - Administrator, Civil Service (Medical): No    Lack of Transportation (Non-Medical): No  Physical Activity: Insufficiently Active (10/10/2023)   Exercise Vital Sign    Days of Exercise per Week: 4 days    Minutes of Exercise per Session: 30 min  Stress: No Stress Concern Present (10/10/2023)   [employer] of Occupational Health - Occupational Stress Questionnaire    Feeling of Stress : Not at all  Social Connections: Socially  Integrated (10/10/2023)   Social Connection and Isolation Panel    Frequency of Communication with Friends and Family: More than three times a week    Frequency of Social Gatherings with Friends and Family: Twice a week    Attends Religious Services: More than 4 times per year    Active Member of [employer] or Organizations: Yes    Attends Engineer, structural: More than 4 times per year    Marital Status: Married  Catering manager Violence: Not on file    FAMILY HISTORY: Family History  Problem Relation Age of Onset   Dementia Mother    High Cholesterol Mother    Hypertension Father    COPD Father        smoker   Heart attack Father    High Cholesterol Father    Stroke Father    Hypothyroidism Father    Hypertension Sister    High Cholesterol  Sister    Allergic rhinitis Sister    Asthma Sister    AVM Sister        with bleed (at [facility])   Hypertension Brother    High Cholesterol Brother    Cancer Maternal Grandmother        unsure type   Breast cancer Paternal Grandmother    Early death Paternal Grandfather 8       massive MI   Heart attack Paternal Grandfather    Eczema Neg Hx    Immunodeficiency Neg Hx    Urticaria Neg Hx     ALLERGIES:  is allergic to alpha-gal.  MEDICATIONS:  Current Outpatient Medications  Medication Sig Dispense Refill   cetirizine (ZYRTEC) 10 MG tablet Take 10 mg by mouth daily as needed for allergies. Zyrtec D     EPINEPHrine  0.3 mg/0.3 mL IJ SOAJ injection Inject 0.3 mg into the muscle as needed for anaphylaxis. 1 each 0   fluticasone  (FLONASE ) 50 MCG/ACT nasal spray Place 1 spray into both nostrils daily.     ibuprofen  (ADVIL ) 200 MG tablet Take 3 tablets (600 mg total) by mouth 2 (two) times daily as needed for headache.     progesterone (PROMETRIUM) 200 MG capsule Take 200 mg by mouth at bedtime.     rizatriptan (MAXALT) 5 MG tablet Take 1 tablet (5 mg total) by mouth as needed for migraine. May repeat in 2 hours if needed      omeprazole  (PRILOSEC) 20 MG capsule Take 1 capsule (20 mg total) by mouth daily as needed.     No current facility-administered medications for this visit.     PHYSICAL EXAMINATION: ECOG PERFORMANCE STATUS: 0 - Asymptomatic Vitals:   05/04/24 1327  BP: 123/78  Pulse: 81  Resp: 16  Temp: 97.9 F (36.6 C)  SpO2: 100%   Filed Weights   05/04/24 1327  Weight: 152 lb (68.9 kg)    Physical Exam Constitutional:      General: She is not in acute distress. HENT:     Head: Normocephalic and atraumatic.   Eyes:     General: No scleral icterus.   Cardiovascular:     Rate and Rhythm: Normal rate and regular rhythm.     Heart sounds: Normal heart sounds.  Pulmonary:     Effort: Pulmonary effort is normal. No respiratory distress.     Breath sounds: No wheezing.  Abdominal:     General: Bowel sounds are normal. There is no distension.     Palpations: Abdomen is soft.   Musculoskeletal:        General: No deformity. Normal range of motion.     Cervical back: Normal range of motion and neck supple.   Skin:    General: Skin is warm and dry.     Findings: No erythema or rash.   Neurological:     Mental Status: She is alert and oriented to person, place, and time. Mental status is at baseline.     Cranial Nerves: No cranial nerve deficit.     Coordination: Coordination normal.   Psychiatric:        Mood and Affect: Mood normal.      LABORATORY DATA:  I have reviewed the data as listed    Latest Ref Rng & Units 04/28/2024    8:04 AM 11/02/2023    8:04 AM 04/27/2023   11:04 AM  CBC  WBC 4.0 - 10.5 K/uL 4.8  4.9  4.9   Hemoglobin 12.0 -  15.0 g/dL 57.8  46.9  62.9   Hematocrit 36.0 - 46.0 % 40.2  39.5  39.6   Platelets 150 - 400 K/uL 216  233  206       Latest Ref Rng & Units 04/28/2024    8:04 AM 11/02/2023    8:04 AM 10/11/2023   11:07 AM  CMP  Glucose 70 - 99 mg/dL  87  82   BUN 6 - 20 mg/dL  10  8   Creatinine 5.28 - 1.00 mg/dL  4.13  2.44   Sodium 010 -  145 mmol/L  138  138   Potassium 3.5 - 5.1 mmol/L  4.3  4.1   Chloride 98 - 111 mmol/L  106  103   CO2 22 - 32 mmol/L  25  26   Calcium 8.9 - 10.3 mg/dL  8.8  9.1   Total Protein 6.5 - 8.1 g/dL 6.7  6.5    Total Bilirubin 0.0 - 1.2 mg/dL 0.7  0.5    Alkaline Phos 38 - 126 U/L 53  44    AST 15 - 41 U/L 22  20    ALT 0 - 44 U/L 19  20       Iron/TIBC/Ferritin/ %Sat    Component Value Date/Time   IRON 208 (H) 04/28/2024 0804   TIBC 262 04/28/2024 0804   FERRITIN 18 04/28/2024 0804   IRONPCTSAT 79 (H) 04/28/2024 0804       RADIOGRAPHIC STUDIES: I have personally reviewed the radiological images as listed and agreed with the findings in the report.  No results found.

## 2024-05-04 NOTE — Progress Notes (Signed)
No phlebotomy today.

## 2024-05-09 DIAGNOSIS — M9903 Segmental and somatic dysfunction of lumbar region: Secondary | ICD-10-CM | POA: Diagnosis not present

## 2024-05-09 DIAGNOSIS — M62830 Muscle spasm of back: Secondary | ICD-10-CM | POA: Diagnosis not present

## 2024-05-09 DIAGNOSIS — M5416 Radiculopathy, lumbar region: Secondary | ICD-10-CM | POA: Diagnosis not present

## 2024-06-07 DIAGNOSIS — M9903 Segmental and somatic dysfunction of lumbar region: Secondary | ICD-10-CM | POA: Diagnosis not present

## 2024-06-07 DIAGNOSIS — M62830 Muscle spasm of back: Secondary | ICD-10-CM | POA: Diagnosis not present

## 2024-06-07 DIAGNOSIS — M5416 Radiculopathy, lumbar region: Secondary | ICD-10-CM | POA: Diagnosis not present

## 2024-06-26 ENCOUNTER — Encounter: Payer: Self-pay | Admitting: Family Medicine

## 2024-06-26 DIAGNOSIS — L509 Urticaria, unspecified: Secondary | ICD-10-CM

## 2024-06-26 MED ORDER — EPINEPHRINE 0.3 MG/0.3ML IJ SOAJ: 0.3000 mg | INTRAMUSCULAR | 0 refills | Status: AC | PRN

## 2024-07-03 DIAGNOSIS — M9903 Segmental and somatic dysfunction of lumbar region: Secondary | ICD-10-CM | POA: Diagnosis not present

## 2024-07-03 DIAGNOSIS — M62830 Muscle spasm of back: Secondary | ICD-10-CM | POA: Diagnosis not present

## 2024-07-03 DIAGNOSIS — M5416 Radiculopathy, lumbar region: Secondary | ICD-10-CM | POA: Diagnosis not present

## 2024-07-31 DIAGNOSIS — M5136 Other intervertebral disc degeneration, lumbar region with discogenic back pain only: Secondary | ICD-10-CM | POA: Diagnosis not present

## 2024-07-31 DIAGNOSIS — M9903 Segmental and somatic dysfunction of lumbar region: Secondary | ICD-10-CM | POA: Diagnosis not present

## 2024-07-31 DIAGNOSIS — M62830 Muscle spasm of back: Secondary | ICD-10-CM | POA: Diagnosis not present

## 2024-07-31 DIAGNOSIS — M5416 Radiculopathy, lumbar region: Secondary | ICD-10-CM | POA: Diagnosis not present

## 2024-09-04 DIAGNOSIS — M5136 Other intervertebral disc degeneration, lumbar region with discogenic back pain only: Secondary | ICD-10-CM | POA: Diagnosis not present

## 2024-09-04 DIAGNOSIS — M5416 Radiculopathy, lumbar region: Secondary | ICD-10-CM | POA: Diagnosis not present

## 2024-09-04 DIAGNOSIS — M9903 Segmental and somatic dysfunction of lumbar region: Secondary | ICD-10-CM | POA: Diagnosis not present

## 2024-09-04 DIAGNOSIS — M62830 Muscle spasm of back: Secondary | ICD-10-CM | POA: Diagnosis not present

## 2024-10-02 ENCOUNTER — Encounter: Payer: Self-pay | Admitting: Oncology

## 2024-10-02 ENCOUNTER — Other Ambulatory Visit: Payer: Self-pay

## 2024-10-02 ENCOUNTER — Telehealth: Payer: Self-pay

## 2024-10-02 DIAGNOSIS — R1011 Right upper quadrant pain: Secondary | ICD-10-CM

## 2024-10-02 NOTE — Telephone Encounter (Signed)
 Patient having right upper quadrant pain for about month that started intermittent but not a constant uncomfortable feeling. Has not noticed anything that aggravates the pain and no history of similar pain.  Not sure if she needs to contact PCP for follow up with Dr. Babara.  Also sent MyChart message with same concern today.

## 2024-10-02 NOTE — Telephone Encounter (Signed)
 Dr. Babara recommends US  RUQ. Pt notified about recommendation via mychart message reply.   Please schedule US  RUQ and notify pt of appt details.

## 2024-10-03 ENCOUNTER — Telehealth: Payer: Self-pay | Admitting: Oncology

## 2024-10-03 NOTE — Telephone Encounter (Signed)
 I called and spoke with pt regarding the scheduling of her US . I told her I had sent a message to the US  department but I see they have not reached out to her. I told pt I can transfer this phone call to centralized scheduling and they will ask her some questions and get her US  scheduled. Pt agreed and phone call transferred to centralized scheduling.

## 2024-10-09 ENCOUNTER — Ambulatory Visit
Admission: RE | Admit: 2024-10-09 | Discharge: 2024-10-09 | Disposition: A | Discharge status: Home / Self Care | Source: Ambulatory Visit | Attending: Oncology | Admitting: Oncology

## 2024-10-09 ENCOUNTER — Other Ambulatory Visit: Payer: Self-pay | Admitting: Family Medicine

## 2024-10-09 DIAGNOSIS — Z1231 Encounter for screening mammogram for malignant neoplasm of breast: Secondary | ICD-10-CM

## 2024-10-09 DIAGNOSIS — R1011 Right upper quadrant pain: Secondary | ICD-10-CM | POA: Diagnosis not present

## 2024-10-09 DIAGNOSIS — M9903 Segmental and somatic dysfunction of lumbar region: Secondary | ICD-10-CM | POA: Diagnosis not present

## 2024-10-09 DIAGNOSIS — M5136 Other intervertebral disc degeneration, lumbar region with discogenic back pain only: Secondary | ICD-10-CM | POA: Diagnosis not present

## 2024-10-09 DIAGNOSIS — M5416 Radiculopathy, lumbar region: Secondary | ICD-10-CM | POA: Diagnosis not present

## 2024-10-09 DIAGNOSIS — M62830 Muscle spasm of back: Secondary | ICD-10-CM | POA: Diagnosis not present

## 2024-10-25 ENCOUNTER — Inpatient Hospital Stay: Attending: Oncology

## 2024-10-25 DIAGNOSIS — M2550 Pain in unspecified joint: Secondary | ICD-10-CM | POA: Insufficient documentation

## 2024-10-25 DIAGNOSIS — R7989 Other specified abnormal findings of blood chemistry: Secondary | ICD-10-CM | POA: Diagnosis not present

## 2024-10-25 DIAGNOSIS — Z823 Family history of stroke: Secondary | ICD-10-CM | POA: Insufficient documentation

## 2024-10-25 DIAGNOSIS — Z8349 Family history of other endocrine, nutritional and metabolic diseases: Secondary | ICD-10-CM | POA: Insufficient documentation

## 2024-10-25 DIAGNOSIS — Z825 Family history of asthma and other chronic lower respiratory diseases: Secondary | ICD-10-CM | POA: Diagnosis not present

## 2024-10-25 DIAGNOSIS — Z803 Family history of malignant neoplasm of breast: Secondary | ICD-10-CM | POA: Insufficient documentation

## 2024-10-25 DIAGNOSIS — Z79899 Other long term (current) drug therapy: Secondary | ICD-10-CM | POA: Insufficient documentation

## 2024-10-25 DIAGNOSIS — Z8701 Personal history of pneumonia (recurrent): Secondary | ICD-10-CM | POA: Insufficient documentation

## 2024-10-25 DIAGNOSIS — Z8249 Family history of ischemic heart disease and other diseases of the circulatory system: Secondary | ICD-10-CM | POA: Insufficient documentation

## 2024-10-25 DIAGNOSIS — R5383 Other fatigue: Secondary | ICD-10-CM | POA: Insufficient documentation

## 2024-10-25 DIAGNOSIS — Z83438 Family history of other disorder of lipoprotein metabolism and other lipidemia: Secondary | ICD-10-CM | POA: Insufficient documentation

## 2024-10-25 DIAGNOSIS — E041 Nontoxic single thyroid nodule: Secondary | ICD-10-CM | POA: Insufficient documentation

## 2024-10-25 LAB — HEPATIC FUNCTION PANEL
ALT: 16 U/L (ref 0–44)
AST: 20 U/L (ref 15–41)
Albumin: 4.3 g/dL (ref 3.5–5.0)
Alkaline Phosphatase: 63 U/L (ref 38–126)
Bilirubin, Direct: 0.1 mg/dL (ref 0.0–0.2)
Indirect Bilirubin: 0.1 mg/dL — ABNORMAL LOW (ref 0.3–0.9)
Total Bilirubin: 0.2 mg/dL (ref 0.0–1.2)
Total Protein: 6.5 g/dL (ref 6.5–8.1)

## 2024-10-25 LAB — CBC WITH DIFFERENTIAL (CANCER CENTER ONLY)
Abs Immature Granulocytes: 0.03 K/uL (ref 0.00–0.07)
Basophils Absolute: 0.1 K/uL (ref 0.0–0.1)
Basophils Relative: 1 %
Eosinophils Absolute: 0.1 K/uL (ref 0.0–0.5)
Eosinophils Relative: 2 %
HCT: 39.3 % (ref 36.0–46.0)
Hemoglobin: 13.5 g/dL (ref 12.0–15.0)
Immature Granulocytes: 0 %
Lymphocytes Relative: 24 %
Lymphs Abs: 1.6 K/uL (ref 0.7–4.0)
MCH: 32.6 pg (ref 26.0–34.0)
MCHC: 34.4 g/dL (ref 30.0–36.0)
MCV: 94.9 fL (ref 80.0–100.0)
Monocytes Absolute: 0.5 K/uL (ref 0.1–1.0)
Monocytes Relative: 7 %
Neutro Abs: 4.5 K/uL (ref 1.7–7.7)
Neutrophils Relative %: 66 %
Platelet Count: 228 K/uL (ref 150–400)
RBC: 4.14 MIL/uL (ref 3.87–5.11)
RDW: 11.8 % (ref 11.5–15.5)
WBC Count: 6.8 K/uL (ref 4.0–10.5)
nRBC: 0 % (ref 0.0–0.2)

## 2024-10-25 LAB — IRON AND TIBC
Iron: 145 ug/dL (ref 28–170)
Saturation Ratios: 52 % — ABNORMAL HIGH (ref 10.4–31.8)
TIBC: 280 ug/dL (ref 250–450)
UIBC: 135 ug/dL

## 2024-10-25 LAB — FERRITIN: Ferritin: 47 ng/mL (ref 11–307)

## 2024-10-27 ENCOUNTER — Other Ambulatory Visit

## 2024-10-30 ENCOUNTER — Encounter: Payer: Self-pay | Admitting: Oncology

## 2024-10-30 ENCOUNTER — Inpatient Hospital Stay: Admitting: Oncology

## 2024-10-30 ENCOUNTER — Encounter: Payer: Self-pay | Admitting: Family Medicine

## 2024-10-30 ENCOUNTER — Ambulatory Visit

## 2024-10-30 ENCOUNTER — Ambulatory Visit (INDEPENDENT_AMBULATORY_CARE_PROVIDER_SITE_OTHER): Admitting: Family Medicine

## 2024-10-30 VITALS — BP 116/78 | HR 74 | Temp 97.8°F | Ht 63.5 in | Wt 154.4 lb

## 2024-10-30 DIAGNOSIS — E785 Hyperlipidemia, unspecified: Secondary | ICD-10-CM

## 2024-10-30 DIAGNOSIS — R2231 Localized swelling, mass and lump, right upper limb: Secondary | ICD-10-CM

## 2024-10-30 DIAGNOSIS — Z0000 Encounter for general adult medical examination without abnormal findings: Secondary | ICD-10-CM | POA: Diagnosis not present

## 2024-10-30 DIAGNOSIS — E041 Nontoxic single thyroid nodule: Secondary | ICD-10-CM

## 2024-10-30 DIAGNOSIS — Z823 Family history of stroke: Secondary | ICD-10-CM | POA: Diagnosis not present

## 2024-10-30 DIAGNOSIS — Z803 Family history of malignant neoplasm of breast: Secondary | ICD-10-CM | POA: Diagnosis not present

## 2024-10-30 DIAGNOSIS — M2550 Pain in unspecified joint: Secondary | ICD-10-CM | POA: Diagnosis not present

## 2024-10-30 DIAGNOSIS — R5383 Other fatigue: Secondary | ICD-10-CM | POA: Diagnosis not present

## 2024-10-30 DIAGNOSIS — Z825 Family history of asthma and other chronic lower respiratory diseases: Secondary | ICD-10-CM | POA: Diagnosis not present

## 2024-10-30 DIAGNOSIS — G43109 Migraine with aura, not intractable, without status migrainosus: Secondary | ICD-10-CM

## 2024-10-30 DIAGNOSIS — Z780 Asymptomatic menopausal state: Secondary | ICD-10-CM

## 2024-10-30 DIAGNOSIS — Z8349 Family history of other endocrine, nutritional and metabolic diseases: Secondary | ICD-10-CM | POA: Diagnosis not present

## 2024-10-30 DIAGNOSIS — Z8701 Personal history of pneumonia (recurrent): Secondary | ICD-10-CM | POA: Diagnosis not present

## 2024-10-30 DIAGNOSIS — Z809 Family history of malignant neoplasm, unspecified: Secondary | ICD-10-CM | POA: Diagnosis not present

## 2024-10-30 DIAGNOSIS — R7989 Other specified abnormal findings of blood chemistry: Secondary | ICD-10-CM | POA: Diagnosis not present

## 2024-10-30 DIAGNOSIS — Z79899 Other long term (current) drug therapy: Secondary | ICD-10-CM | POA: Diagnosis not present

## 2024-10-30 DIAGNOSIS — Z91018 Allergy to other foods: Secondary | ICD-10-CM

## 2024-10-30 DIAGNOSIS — Z8249 Family history of ischemic heart disease and other diseases of the circulatory system: Secondary | ICD-10-CM | POA: Diagnosis not present

## 2024-10-30 DIAGNOSIS — Z83438 Family history of other disorder of lipoprotein metabolism and other lipidemia: Secondary | ICD-10-CM | POA: Diagnosis not present

## 2024-10-30 LAB — LIPID PANEL
Cholesterol: 197 mg/dL (ref 0–200)
HDL: 59.1 mg/dL (ref >39.00)
LDL Cholesterol: 116 mg/dL — ABNORMAL HIGH (ref 0–99)
NonHDL: 138.25
Total CHOL/HDL Ratio: 3
Triglycerides: 112 mg/dL (ref 0.0–149.0)
VLDL: 22.4 mg/dL (ref 0.0–40.0)

## 2024-10-30 LAB — BASIC METABOLIC PANEL WITH GFR
BUN: 12 mg/dL (ref 6–23)
CO2: 28 meq/L (ref 19–32)
Calcium: 9.5 mg/dL (ref 8.4–10.5)
Chloride: 104 meq/L (ref 96–112)
Creatinine, Ser: 0.75 mg/dL (ref 0.40–1.20)
GFR: 92.21 mL/min (ref >60.00)
Glucose, Bld: 82 mg/dL (ref 70–99)
Potassium: 4.4 meq/L (ref 3.5–5.1)
Sodium: 140 meq/L (ref 135–145)

## 2024-10-30 LAB — TSH: TSH: 1.91 u[IU]/mL (ref 0.35–5.50)

## 2024-10-30 NOTE — Assessment & Plan Note (Signed)
 Chronic, off medication - update FLP. The 10-year ASCVD risk score (Arnett DK, et al., 2019) is: 1.2%   Values used to calculate the score:     Age: 53 years     Clincally relevant sex: Female     Is Non-Hispanic African American: No     Diabetic: No     Tobacco smoker: No     Systolic Blood Pressure: 116 mmHg     Is BP treated: No     HDL Cholesterol: 56.5 mg/dL     Total Cholesterol: 217 mg/dL

## 2024-10-30 NOTE — Assessment & Plan Note (Signed)
 No discrete axillary mass.  Possible Accessory breast tissue She has screening mammogram scheduled. I advise her to talk to PCP to change study to diagnostic mammogram with right US  axillary.

## 2024-10-30 NOTE — Assessment & Plan Note (Signed)
#  Hereditary hemochromatosis,C282Y/C282Y homozygous. Labs are reviewed and discussed with patient. Lab Results  Component Value Date   HGB 13.5 10/25/2024   TIBC 280 10/25/2024   IRONPCTSAT 52 (H) 10/25/2024   FERRITIN 47 10/25/2024    Ferritin is at the goal, less than 50. Isolated increased iron saturation. Normal LFT No need for therapeutic phlebotomy.  Avoid alcohol ,iron and vitamin C supplementation.

## 2024-10-30 NOTE — Assessment & Plan Note (Signed)
 Has seen [facility] neurology. Only had 1 migraine this year.

## 2024-10-30 NOTE — Progress Notes (Signed)
 Hematology/Oncology Progress note [phone] [phone]      Patient Care Team: Rilla Baller, MD as PCP - Diedre Babara Call, MD as Consulting Physician (Oncology)  ASSESSMENT & PLAN:   Hemochromatosis, hereditary #Hereditary hemochromatosis,C282Y/C282Y homozygous. Labs are reviewed and discussed with patient. Lab Results  Component Value Date   HGB 13.5 10/25/2024   TIBC 280 10/25/2024   IRONPCTSAT 52 (H) 10/25/2024   FERRITIN 47 10/25/2024    Ferritin is at the goal, less than 50. Isolated increased iron saturation. Normal LFT No need for therapeutic phlebotomy.  Avoid alcohol ,iron and vitamin C supplementation.  Right axillary fullness No discrete axillary mass.  Possible Accessory breast tissue She has screening mammogram scheduled. I advise her to talk to PCP to change study to diagnostic mammogram with right US  axillary.   Family history of cancer Refer to genetics    Orders Placed This Encounter  Procedures   CBC with Differential (Cancer Center Only)    Standing Status:   Future    Expected Date:   04/30/2025    Expiration Date:   07/29/2025   Iron and TIBC    Standing Status:   Future    Expected Date:   04/30/2025    Expiration Date:   07/29/2025   Ferritin    Standing Status:   Future    Expected Date:   04/30/2025    Expiration Date:   07/29/2025   Hepatic function panel    Standing Status:   Future    Expected Date:   04/30/2025    Expiration Date:   07/29/2025   Ambulatory referral to Genetics    Referral Priority:   Routine    Referral Type:   Consultation    Referral Reason:   Specialty Services Required    Number of Visits Requested:   1   Follow up in 6 months.  All questions were answered. The patient knows to call the clinic with any problems, questions or concerns.  Call Babara, MD, PhD [facility] Hematology Oncology 10/30/2024    CHIEF COMPLAINTS/REASON FOR VISIT:  hereditary hemochromatosis  HISTORY OF PRESENTING  ILLNESS:  Dana Salas is a 53 y.o. female who was seen in consultation at the request of Rilla Baller, MD for evaluation of  hereditary hemochromatosis .  Patient reports feeling fatigued recently and had blood work done by primary care provider . 09/21/21, TIBC 253, iron saturation 84.5, ferritin 65.2.  Transferrin 181. CBC showed hemoglobin 13.7, normal WBC and platelet count. Hemochromatosis screening PCR showed C282Y/C282Y homozygous mutation. Patient also reports family history of sister was found to have elevated ferritin 600.  Sister and brothers hemochromatosis work-up is pending.  Both of her parents deceased. 21-Sep-2021, CMP showed normal AST, ALT, bilirubin. She drinks alcohol socially. Positive for arthralgia.  No unintentional weight loss, night sweats, fever.  Patient was accompanied by her husband.  09/18/2022 MRI showed 1.2cm pineal lesion, most likely reflects a benign incidental cyst, pineocytoma can not be excluded.  neurology recommends repeat MRI in 12 months to follow-up this lesion.  10/12/23 US  thyroid  1.6cm nodule, she follows up with PCP for management   INTERVAL HISTORY Dana Salas is a 53 y.o. female who has above history reviewed by me today presents for follow up visit for homozygous C282Y hemochromatosis Patient reports feeling well.  No new complaints.  Right upper quadrant pain has resolved. US  RUQ negative. She felt that maybe musculoskeletal.  She has concerns of right axillary fullness.  Review of Systems  Constitutional:  Positive for fatigue. Negative for appetite change, chills and fever.  HENT:   Negative for hearing loss and voice change.   Eyes:  Negative for eye problems.  Respiratory:  Negative for chest tightness and cough.   Cardiovascular:  Negative for chest pain.  Gastrointestinal:  Negative for abdominal distention, abdominal pain and blood in stool.  Endocrine: Negative for hot flashes.  Genitourinary:  Negative for  difficulty urinating and frequency.   Musculoskeletal:  Positive for arthralgias.  Skin:  Negative for itching and rash.  Neurological:  Negative for extremity weakness.  Hematological:  Negative for adenopathy.  Psychiatric/Behavioral:  Negative for confusion.     MEDICAL HISTORY:  Past Medical History:  Diagnosis Date   Allergy  July 24   Alpha Gal   Allergy  to alpha-gal    Boil, labium 33/2016   Hemochromatosis, hereditary 09/02/2021   DNA PCR test 08/2021 - positive for 2 copies of HFE gene pathogenic variant: C282Y/C282Y (HOMOZYGOTE)   Other abnormal blood chemistry    Iron,serum, elevated   Pneumonia 09/2002   outpatient    SURGICAL HISTORY: Past Surgical History:  Procedure Laterality Date   COLONOSCOPY WITH PROPOFOL  N/A 04/15/2020   TA, rpt 5 yrs (Tahiliani, Keene NOVAK, MD)    SOCIAL HISTORY: Social History   Socioeconomic History   Marital status: Married    Spouse name: Not on file   Number of children: 3   Years of education: Not on file   Highest education level: Bachelor's degree (e.g., BA, AB, BS)  Occupational History   Occupation: Sports Coach at [employer]  Tobacco Use   Smoking status: Never    Passive exposure: Never   Smokeless tobacco: Never  Vaping Use   Vaping status: Never Used  Substance and Sexual Activity   Alcohol use: Not Currently    Alcohol/week: 1.0 standard drink of alcohol   Drug use: Never   Sexual activity: Yes    Partners: Male    Birth control/protection: None, Surgical    Comment: vastcotomy  Other Topics Concern   Not on file  Social History Narrative   Not on file   Social Drivers of Health   Financial Resource Strain: Low Risk  (10/29/2024)   Overall Financial Resource Strain (CARDIA)    Difficulty of Paying Living Expenses: Not hard at all  Food Insecurity: No Food Insecurity (10/29/2024)   Hunger Vital Sign    Worried About Running Out of Food in the Last Year: Never true    Ran Out of Food in the Last  Year: Never true  Transportation Needs: No Transportation Needs (10/29/2024)   PRAPARE - Administrator, Civil Service (Medical): No    Lack of Transportation (Non-Medical): No  Physical Activity: Insufficiently Active (10/29/2024)   Exercise Vital Sign    Days of Exercise per Week: 3 days    Minutes of Exercise per Session: 40 min  Stress: No Stress Concern Present (10/29/2024)   [employer] of Occupational Health - Occupational Stress Questionnaire    Feeling of Stress: Not at all  Social Connections: Moderately Integrated (10/29/2024)   Social Connection and Isolation Panel    Frequency of Communication with Friends and Family: Once a week    Frequency of Social Gatherings with Friends and Family: Once a week    Attends Religious Services: More than 4 times per year    Active Member of [employer] or Organizations: Yes    Attends Club or  Organization Meetings: More than 4 times per year    Marital Status: Married  Catering Manager Violence: Not on file    FAMILY HISTORY: Family History  Problem Relation Age of Onset   Dementia Mother    High Cholesterol Mother    Hypertension Father    COPD Father        smoker   Heart attack Father    High Cholesterol Father    Stroke Father    Hypothyroidism Father    Hypertension Sister    High Cholesterol Sister    Allergic rhinitis Sister    Asthma Sister    AVM Sister        with bleed (at [facility])   Hypertension Brother    High Cholesterol Brother    Cancer Maternal Grandmother        unsure type   Breast cancer Paternal Grandmother    Early death Paternal Grandfather 49       massive MI   Heart attack Paternal Grandfather    Eczema Neg Hx    Immunodeficiency Neg Hx    Urticaria Neg Hx     ALLERGIES:  is allergic to alpha-gal.  MEDICATIONS:  Current Outpatient Medications  Medication Sig Dispense Refill   cetirizine (ZYRTEC) 10 MG tablet Take 10 mg by mouth daily as needed for allergies. Zyrtec D      EPINEPHrine  0.3 mg/0.3 mL IJ SOAJ injection Inject 0.3 mg into the muscle as needed for anaphylaxis. 2 each 0   fluticasone  (FLONASE ) 50 MCG/ACT nasal spray Place 1 spray into both nostrils daily.     ibuprofen  (ADVIL ) 200 MG tablet Take 3 tablets (600 mg total) by mouth 2 (two) times daily as needed for headache.     progesterone (PROMETRIUM) 200 MG capsule Take 200 mg by mouth at bedtime.     rizatriptan (MAXALT) 5 MG tablet Take 1 tablet (5 mg total) by mouth as needed for migraine. May repeat in 2 hours if needed     No current facility-administered medications for this visit.     PHYSICAL EXAMINATION: ECOG PERFORMANCE STATUS: 0 - Asymptomatic Vitals:   10/30/24 1443  BP: 122/78  Pulse: 78  Resp: 18  Temp: 97.6 F (36.4 C)  SpO2: 100%   Filed Weights   10/30/24 1443  Weight: 157 lb 9.6 oz (71.5 kg)    Physical Exam Constitutional:      General: She is not in acute distress. HENT:     Head: Normocephalic and atraumatic.  Eyes:     General: No scleral icterus. Cardiovascular:     Rate and Rhythm: Normal rate and regular rhythm.     Heart sounds: Normal heart sounds.  Pulmonary:     Effort: Pulmonary effort is normal. No respiratory distress.     Breath sounds: No wheezing.  Abdominal:     General: Bowel sounds are normal. There is no distension.     Palpations: Abdomen is soft.  Musculoskeletal:        General: No deformity. Normal range of motion.     Cervical back: Normal range of motion and neck supple.  Skin:    General: Skin is warm and dry.     Findings: No erythema or rash.  Neurological:     Mental Status: She is alert and oriented to person, place, and time. Mental status is at baseline.     Cranial Nerves: No cranial nerve deficit.     Coordination: Coordination normal.  Psychiatric:  Mood and Affect: Mood normal.   I did not palpable any discrete right axillary mass.  Mild fullness.    LABORATORY DATA:  I have reviewed the data as  listed    Latest Ref Rng & Units 10/25/2024    4:09 PM 04/28/2024    8:04 AM 11/02/2023    8:04 AM  CBC  WBC 4.0 - 10.5 K/uL 6.8  4.8  4.9   Hemoglobin 12.0 - 15.0 g/dL 86.4  86.3  86.4   Hematocrit 36.0 - 46.0 % 39.3  40.2  39.5   Platelets 150 - 400 K/uL 228  216  233       Latest Ref Rng & Units 10/30/2024   10:04 AM 10/25/2024    4:09 PM 04/28/2024    8:04 AM  CMP  Glucose 70 - 99 mg/dL 82     BUN 6 - 23 mg/dL 12     Creatinine 9.59 - 1.20 mg/dL 9.24     Sodium 864 - 854 mEq/L 140     Potassium 3.5 - 5.1 mEq/L 4.4     Chloride 96 - 112 mEq/L 104     CO2 19 - 32 mEq/L 28     Calcium 8.4 - 10.5 mg/dL 9.5     Total Protein 6.5 - 8.1 g/dL  6.5  6.7   Total Bilirubin 0.0 - 1.2 mg/dL  0.2  0.7   Alkaline Phos 38 - 126 U/L  63  53   AST 15 - 41 U/L  20  22   ALT 0 - 44 U/L  16  19      Iron/TIBC/Ferritin/ %Sat    Component Value Date/Time   IRON 145 10/25/2024 1609   TIBC 280 10/25/2024 1609   FERRITIN 47 10/25/2024 1609   IRONPCTSAT 52 (H) 10/25/2024 1609       RADIOGRAPHIC STUDIES: I have personally reviewed the radiological images as listed and agreed with the findings in the report.  US  ABDOMEN LIMITED RUQ (LIVER/GB) Result Date: 10/09/2024 CLINICAL DATA:  Right upper quadrant abdomen pain for 1 month. EXAM: ULTRASOUND ABDOMEN LIMITED RIGHT UPPER QUADRANT COMPARISON:  September 17, 2021 FINDINGS: Gallbladder: No gallstones or wall thickening visualized. No sonographic Murphy sign noted by sonographer. Common bile duct: Diameter: 1.2 mm. Liver: No focal lesion identified. Within normal limits in parenchymal echogenicity. Portal vein is patent on color Doppler imaging with normal direction of blood flow towards the liver. Other: None. IMPRESSION: Normal right upper quadrant ultrasound. Electronically Signed   By: Craig Farr M.D.   On: 10/09/2024 09:50

## 2024-10-30 NOTE — Assessment & Plan Note (Signed)
 Preventative protocols reviewed and updated unless pt declined. Discussed healthy diet and lifestyle.

## 2024-10-30 NOTE — Assessment & Plan Note (Signed)
 Marked abd pain, nausea, more recently with hives, to alpha gal exposure.  She continues avoiding mammalian meats.  Has access to epi pen at home

## 2024-10-30 NOTE — Progress Notes (Signed)
 Ph: [phone] Fax: [phone]   Patient ID: Dana Salas, female    DOB: [birth year], 53 y.o.   MRN: [id number]  This visit was conducted in person.  BP 116/78   Pulse 74   Temp 97.8 F (36.6 C) (Oral)   Ht 5' 3.5 (1.613 m)   Wt 154 lb 6.4 oz (70 kg)   SpO2 99%   BMI 26.92 kg/m    CC: CPE Subjective:   HPI: Dana Salas is a 53 y.o. female presenting on 10/30/2024 for Annual Exam (No concerns)   Known Hereditary hemochromatosis (C282Y/C282Y homozygous) dx 2022 followed by hematology and alpha gal diagnosed summer 2023. She continues avoiding mammalian meats - bad abdominal pain with any exposure.   H/o headaches with transient vision changes thought ocular migraine. 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 [facility] 07/2023 - started Maxalt abortive treatment PRN.  Only had 1 ocular migraine this past year.    Sister with L cerebral AVM (thought hereditary).  Personal brain MRI reassuring 07/2023.  Father with h/o stroke and MI.   She started hormone replacement with subdermal pellet (testosterone and estrogen) also takes progesterone 200mg  nightly.  LMP: 10/17/2023  Goes through [facility] for hormonal treatment. Also sees [facility] in [place].   Some RUQ abd discomfort last month - RUQ US  ordered by hematology returned normal. Pain may have started after lifting heavy linoleum roll onto car. Felt sharp pain across her stomach at that time. Thinks this pain was MSK - it has been improving.   Preventative: COLONOSCOPY WITH PROPOFOL  04/15/2020 - TA, rpt 5 yrs (Tahiliani, Keene NOVAK, MD) [facility] Margarett [facility]) last seen 09/2023. Last pap 09/2023 WNL. Mammo 11/2023 - Birads1 @ [place]. H/o fibrocystic breast disease.  Lung cancer screening  Flu - declines COVID vaccine - has decided to defer for now  Td 2005, 2011, Tdap  08/2022 Prevnar-20 - discussed  Shingrix - discussed  Seat belt use discussed  Sunscreen use discussed. No changing moles on skin.  Sleep - averages 8 hours/night  Non smoker  Alcohol - occasional  Dentist - q6 mo  Eye exam yearly  Bowel - no constipation    Lives with husband and 3 children (1 in college) Edu: bachelor's Occ: Network Engineer  Activity: working out regularly, just bought peloton  Diet: good water, fruits/vegetables daily      Relevant past medical, surgical, family and social history reviewed and updated as indicated. Interim medical history since our last visit reviewed. Allergies and medications reviewed and updated. Outpatient Medications Prior to Visit  Medication Sig Dispense Refill   cetirizine (ZYRTEC) 10 MG tablet Take 10 mg by mouth daily as needed for allergies. Zyrtec D     EPINEPHrine  0.3 mg/0.3 mL IJ SOAJ injection Inject 0.3 mg into the muscle as needed for anaphylaxis. 2 each 0   fluticasone  (FLONASE ) 50 MCG/ACT nasal spray Place 1 spray into both nostrils daily.     ibuprofen  (ADVIL ) 200 MG tablet Take 3 tablets (600 mg total) by mouth 2 (two) times daily as needed for headache.     progesterone (PROMETRIUM) 200 MG capsule Take 200 mg by mouth at bedtime.     rizatriptan (MAXALT) 5 MG tablet Take 1 tablet (5 mg total) by mouth as needed for migraine. May repeat in 2 hours if needed     No facility-administered medications prior  to visit.     Per HPI unless specifically indicated in ROS section below Review of Systems  Constitutional:  Negative for activity change, appetite change, chills, fatigue, fever and unexpected weight change.  HENT:  Negative for hearing loss.   Eyes:  Negative for visual disturbance.  Respiratory:  Negative for cough, chest tightness, shortness of breath and wheezing.   Cardiovascular:  Negative for chest pain, palpitations and leg swelling.  Gastrointestinal:  Negative for abdominal distention,  abdominal pain, blood in stool, constipation, diarrhea, nausea and vomiting.  Genitourinary:  Negative for difficulty urinating and hematuria.  Musculoskeletal:  Negative for arthralgias, myalgias and neck pain.  Skin:  Negative for rash.  Neurological:  Negative for dizziness, seizures, syncope and headaches.  Hematological:  Negative for adenopathy. Does not bruise/bleed easily.  Psychiatric/Behavioral:  Negative for dysphoric mood. The patient is not nervous/anxious.     Objective:  BP 116/78   Pulse 74   Temp 97.8 F (36.6 C) (Oral)   Ht 5' 3.5 (1.613 m)   Wt 154 lb 6.4 oz (70 kg)   SpO2 99%   BMI 26.92 kg/m   Wt Readings from Last 3 Encounters:  10/30/24 154 lb 6.4 oz (70 kg)  05/04/24 152 lb (68.9 kg)  11/05/23 166 lb (75.3 kg)      Physical Exam Vitals and nursing note reviewed.  Constitutional:      Appearance: Normal appearance. She is not ill-appearing.  HENT:     Head: Normocephalic and atraumatic.     Right Ear: Tympanic membrane, ear canal and external ear normal. There is no impacted cerumen.     Left Ear: Tympanic membrane, ear canal and external ear normal. There is no impacted cerumen.     Mouth/Throat:     Mouth: Mucous membranes are moist.     Pharynx: Oropharynx is clear. No oropharyngeal exudate or posterior oropharyngeal erythema.  Eyes:     General:        Right eye: No discharge.        Left eye: No discharge.     Extraocular Movements: Extraocular movements intact.     Conjunctiva/sclera: Conjunctivae normal.     Pupils: Pupils are equal, round, and reactive to light.  Neck:     Thyroid : Thyroid  mass (small nodule on left) present. No thyromegaly.  Cardiovascular:     Rate and Rhythm: Normal rate and regular rhythm.     Pulses: Normal pulses.     Heart sounds: Normal heart sounds. No murmur heard. Pulmonary:     Effort: Pulmonary effort is normal. No respiratory distress.     Breath sounds: Normal breath sounds. No wheezing, rhonchi or  rales.  Abdominal:     General: Bowel sounds are normal. There is no distension.     Palpations: Abdomen is soft. There is no mass.     Tenderness: There is no abdominal tenderness. There is no guarding or rebound.     Hernia: No hernia is present.  Musculoskeletal:     Cervical back: Normal range of motion and neck supple. No rigidity.     Right lower leg: No edema.     Left lower leg: No edema.  Lymphadenopathy:     Cervical: No cervical adenopathy.  Skin:    General: Skin is warm and dry.     Findings: No rash.  Neurological:     General: No focal deficit present.     Mental Status: She is alert. Mental status is at baseline.  Psychiatric:        Mood and Affect: Mood normal.        Behavior: Behavior normal.       Results for orders placed or performed in visit on 10/25/24  Hepatic function panel   Collection Time: 10/25/24  4:09 PM  Result Value Ref Range   Total Protein 6.5 6.5 - 8.1 g/dL   Albumin 4.3 3.5 - 5.0 g/dL   AST 20 15 - 41 U/L   ALT 16 0 - 44 U/L   Alkaline Phosphatase 63 38 - 126 U/L   Total Bilirubin 0.2 0.0 - 1.2 mg/dL   Bilirubin, Direct 0.1 0.0 - 0.2 mg/dL   Indirect Bilirubin 0.1 (L) 0.3 - 0.9 mg/dL  Ferritin   Collection Time: 10/25/24  4:09 PM  Result Value Ref Range   Ferritin 47 11 - 307 ng/mL  CBC with Differential (Cancer Center Only)   Collection Time: 10/25/24  4:09 PM  Result Value Ref Range   WBC Count 6.8 4.0 - 10.5 K/uL   RBC 4.14 3.87 - 5.11 MIL/uL   Hemoglobin 13.5 12.0 - 15.0 g/dL   HCT 60.6 63.9 - 53.9 %   MCV 94.9 80.0 - 100.0 fL   MCH 32.6 26.0 - 34.0 pg   MCHC 34.4 30.0 - 36.0 g/dL   RDW 88.1 88.4 - 84.4 %   Platelet Count 228 150 - 400 K/uL   nRBC 0.0 0.0 - 0.2 %   Neutrophils Relative % 66 %   Neutro Abs 4.5 1.7 - 7.7 K/uL   Lymphocytes Relative 24 %   Lymphs Abs 1.6 0.7 - 4.0 K/uL   Monocytes Relative 7 %   Monocytes Absolute 0.5 0.1 - 1.0 K/uL   Eosinophils Relative 2 %   Eosinophils Absolute 0.1 0.0 - 0.5 K/uL    Basophils Relative 1 %   Basophils Absolute 0.1 0.0 - 0.1 K/uL   Immature Granulocytes 0 %   Abs Immature Granulocytes 0.03 0.00 - 0.07 K/uL  Iron and TIBC   Collection Time: 10/25/24  4:09 PM  Result Value Ref Range   Iron 145 28 - 170 ug/dL   TIBC 719 749 - 549 ug/dL   Saturation Ratios 52 (H) 10.4 - 31.8 %   UIBC 135 ug/dL   Lab Results  Component Value Date   NA 138 11/02/2023   CL 106 11/02/2023   K 4.3 11/02/2023   CO2 25 11/02/2023   BUN 10 11/02/2023   CREATININE 0.83 11/02/2023   GFRNONAA >60 11/02/2023   CALCIUM 8.8 (L) 11/02/2023   ALBUMIN 4.3 10/25/2024   GLUCOSE 87 11/02/2023    Lab Results  Component Value Date   TSH 1.54 08/11/2023    Lab Results  Component Value Date   CHOL 217 (H) 10/11/2023   HDL 56.50 10/11/2023   LDLCALC 137 (H) 10/11/2023   TRIG 120.0 10/11/2023   CHOLHDL 4 10/11/2023   Assessment & Plan:   Problem List Items Addressed This Visit     Health maintenance examination - Primary (Chronic)   Preventative protocols reviewed and updated unless pt declined. Discussed healthy diet and lifestyle.       Hemochromatosis, hereditary (Chronic)   Appreciate hematology care, goal ferritin <50.       Hyperlipidemia   Chronic, off medication - update FLP. The 10-year ASCVD risk score (Arnett DK, et al., 2019) is: 1.2%   Values used to calculate the score:     Age: 41 years  Clincally relevant sex: Female     Is Non-Hispanic African American: No     Diabetic: No     Tobacco smoker: No     Systolic Blood Pressure: 116 mmHg     Is BP treated: No     HDL Cholesterol: 56.5 mg/dL     Total Cholesterol: 217 mg/dL       Relevant Orders   Basic metabolic panel with GFR   Lipid panel   Post-menopause   LMP 09/2023 She continues hormonal replacement through [facility].  She takes progesterone tablets nightly       Allergy  to galactose-alpha-1,3-galactose   Marked abd pain, nausea, more recently with hives, to alpha gal exposure.   She continues avoiding mammalian meats.  Has access to epi pen at home      Ocular migraine   Has seen [facility] neurology. Only had 1 migraine this year.       Thyroid  nodule   Update thyroid  US  to monitor 1.6cm L thyroid  nodule noted last year.       Relevant Orders   US  THYROID    TSH     No orders of the defined types were placed in this encounter.   Orders Placed This Encounter  Procedures   US  THYROID     Standing Status:   Future    Expiration Date:   10/30/2025    Reason for Exam (SYMPTOM  OR DIAGNOSIS REQUIRED):   f/u L thyroid  nodule    Preferred imaging location?:   [facility]   Basic metabolic panel with GFR   Lipid panel   TSH    Patient Instructions  Labs today  I will order thyroid  ultrasound - you may call [place] imaging to schedule appointment.  Consider shingles shot series and pneumonia shot  Good to see you today Return as needed or in 1 year for next physical.   Follow up plan: Return in about 1 year (around 10/30/2025) for annual exam, prior fasting for blood work.  Anton Blas, MD

## 2024-10-30 NOTE — Assessment & Plan Note (Signed)
Refer to genetics

## 2024-10-30 NOTE — Assessment & Plan Note (Signed)
 Appreciate hematology care, goal ferritin <50.

## 2024-10-30 NOTE — Patient Instructions (Addendum)
 Labs today  I will order thyroid  ultrasound - you may call [place] imaging to schedule appointment.  Consider shingles shot series and pneumonia shot  Good to see you today Return as needed or in 1 year for next physical.

## 2024-10-30 NOTE — Progress Notes (Signed)
 Hold phlebotomy today per Dr. Babara

## 2024-10-30 NOTE — Assessment & Plan Note (Signed)
 Update thyroid  US  to monitor 1.6cm L thyroid  nodule noted last year.

## 2024-10-30 NOTE — Assessment & Plan Note (Addendum)
 LMP 09/2023 She continues hormonal replacement through [facility].  She takes progesterone tablets nightly

## 2024-10-31 ENCOUNTER — Other Ambulatory Visit: Payer: Self-pay | Admitting: Family Medicine

## 2024-10-31 ENCOUNTER — Ambulatory Visit: Payer: Self-pay | Admitting: Family Medicine

## 2024-10-31 DIAGNOSIS — R2231 Localized swelling, mass and lump, right upper limb: Secondary | ICD-10-CM

## 2024-10-31 DIAGNOSIS — E041 Nontoxic single thyroid nodule: Secondary | ICD-10-CM

## 2024-11-06 DIAGNOSIS — M5416 Radiculopathy, lumbar region: Secondary | ICD-10-CM | POA: Diagnosis not present

## 2024-11-06 DIAGNOSIS — M5136 Other intervertebral disc degeneration, lumbar region with discogenic back pain only: Secondary | ICD-10-CM | POA: Diagnosis not present

## 2024-11-06 DIAGNOSIS — M62830 Muscle spasm of back: Secondary | ICD-10-CM | POA: Diagnosis not present

## 2024-11-06 DIAGNOSIS — M9903 Segmental and somatic dysfunction of lumbar region: Secondary | ICD-10-CM | POA: Diagnosis not present

## 2024-11-08 ENCOUNTER — Other Ambulatory Visit: Payer: Self-pay | Admitting: Licensed Clinical Social Worker

## 2024-11-08 ENCOUNTER — Telehealth: Payer: Self-pay

## 2024-11-08 ENCOUNTER — Inpatient Hospital Stay: Admitting: Licensed Clinical Social Worker

## 2024-11-08 ENCOUNTER — Other Ambulatory Visit: Payer: Self-pay

## 2024-11-08 ENCOUNTER — Inpatient Hospital Stay

## 2024-11-08 DIAGNOSIS — Z1379 Encounter for other screening for genetic and chromosomal anomalies: Secondary | ICD-10-CM

## 2024-11-08 DIAGNOSIS — Z860100 Personal history of colon polyps, unspecified: Secondary | ICD-10-CM

## 2024-11-08 DIAGNOSIS — Z803 Family history of malignant neoplasm of breast: Secondary | ICD-10-CM

## 2024-11-08 LAB — GENETIC SCREENING ORDER

## 2024-11-08 NOTE — Telephone Encounter (Signed)
 Gastroenterology Pre-Procedure Review  Request Date: 02/22/2025 Requesting Physician: Dr. Jinny  PATIENT REVIEW QUESTIONS: The patient responded to the following health history questions as indicated:    1. Are you having any GI issues? no 2. Do you have a personal history of Polyps? yes (Dr. Janalyn 04/15/2020  polyps) 3. Do you have a family history of Colon Cancer or Polyps? no 4. Diabetes Mellitus? no 5. Joint replacements in the past 12 months?no 6. Major health problems in the past 3 months?no 7. Any artificial heart valves, MVP, or defibrillator?no    MEDICATIONS & ALLERGIES:    Patient reports the following regarding taking any anticoagulation/antiplatelet therapy:   Plavix, Coumadin, Eliquis, Xarelto, Lovenox, Pradaxa, Brilinta, or Effient? no Aspirin? no  Patient confirms/reports the following medications:  Current Outpatient Medications  Medication Sig Dispense Refill   cetirizine (ZYRTEC) 10 MG tablet Take 10 mg by mouth daily as needed for allergies. Zyrtec D     EPINEPHrine  0.3 mg/0.3 mL IJ SOAJ injection Inject 0.3 mg into the muscle as needed for anaphylaxis. 2 each 0   fluticasone  (FLONASE ) 50 MCG/ACT nasal spray Place 1 spray into both nostrils daily.     ibuprofen  (ADVIL ) 200 MG tablet Take 3 tablets (600 mg total) by mouth 2 (two) times daily as needed for headache.     progesterone (PROMETRIUM) 200 MG capsule Take 200 mg by mouth at bedtime.     rizatriptan (MAXALT) 5 MG tablet Take 1 tablet (5 mg total) by mouth as needed for migraine. May repeat in 2 hours if needed     No current facility-administered medications for this visit.    Patient confirms/reports the following allergies:  Allergies[1]  No orders of the defined types were placed in this encounter.   AUTHORIZATION INFORMATION Primary Insurance: 1D#: Group #:  Secondary Insurance: 1D#: Group #:  SCHEDULE INFORMATION: Date: 02/22/2025 Time: Location: [facility] Dr. Jinny     [1]   Allergies Allergen Reactions   Alpha-Gal Diarrhea, Hives and Nausea Only

## 2024-11-08 NOTE — Progress Notes (Signed)
 REFERRING PROVIDER: Babara Call, MD [address] [place],  [place] [address]  PRIMARY PROVIDER:  Rilla Baller, MD  PRIMARY REASON FOR VISIT:  1. Family history of breast cancer      HISTORY OF PRESENT ILLNESS:   Dana Salas, a 53 y.o. female, was seen for a [facility] cancer genetics consultation at the request of Dr. Babara due to a family history of cancer.  Ms. Mangini presents to clinic today to discuss the possibility of a hereditary predisposition to cancer, genetic testing, and to further clarify her future cancer risks, as well as potential cancer risks for family members.   CANCER HISTORY:  Ms. Valadez is a 53 y.o. female with no personal history of cancer.    RELEVANT MEDICAL HISTORY:  Menarche was at age 22-12.  First live birth at age 31.  Ovaries intact: yes.  Hysterectomy: no.  Menopausal status: perimenopausal.  HRT use: 0 years. Colonoscopy: yes; 1 precancerous polyp. Mammogram within the last year: yes. Number of breast biopsies: 0.   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  Significant diagnoses are listed below: Family History  Problem Relation Age of Onset   Dementia Mother    High Cholesterol Mother    Hypertension Father    COPD Father        smoker   Heart attack Father    High Cholesterol Father    Stroke Father    Hypothyroidism Father    Hypertension Sister    High Cholesterol Sister    Allergic rhinitis Sister     Asthma Sister    AVM Sister        with bleed (at [facility])   Hypertension Brother    High Cholesterol Brother    Cancer Maternal Grandmother        unsure type   Breast cancer Paternal Grandmother    Early death Paternal Grandfather 74       massive MI   Heart attack Paternal Grandfather    Eczema Neg Hx    Immunodeficiency Neg Hx    Urticaria Neg Hx     Ms. Moxon is unaware of previous family history of genetic testing for hereditary cancer risks. There is no reported Ashkenazi Jewish ancestry. There is no known consanguinity.  GENETIC COUNSELING ASSESSMENT: Ms. Wampole is a 53 y.o. female with a family history which is not particularly suggestive of a hereditary cancer syndrome and predisposition to cancer. We, therefore, discussed and recommended the following at today's visit.   DISCUSSION: We discussed that, in general, most cancer is not inherited in families, but instead is sporadic or familial. Sporadic cancers occur by chance and typically happen at older ages (>50 years). We discussed that approximately 10% of breast cancer is hereditary. Most cases of hereditary breast cancer are associated with BRCA1/BRCA2 genes, although there are other genes associated with hereditary cancer as well. Cancers and risks are gene  specific. We discussed that testing is beneficial for several reasons including knowing about cancer risks, identifying potential screening and risk-reduction options that may be appropriate, and to understand if other family members could be at risk for cancer and allow them to undergo genetic testing.   We reviewed the characteristics, features and inheritance patterns of hereditary cancer syndromes. We also discussed genetic testing, including the appropriate family members to test, the process of testing, insurance coverage and turn-around-time for results. We discussed the implications of a negative, positive and/or variant of uncertain significant result.  Based  on Ms. Rosselli's family history of cancer, she does not meet medical criteria for testing. However, she could consider the [facility] CancerNext-Expanded+RNA Panel for a self pay price of $249.   The CancerNext-Expanded gene panel offered by [facility] and includes sequencing, rearrangement, and RNA analysis for the following 77 genes: AIP, ALK, APC, ATM, AXIN2, BAP1, BARD1, BMPR1A, BRCA1, BRCA2, BRIP1, CDC73, CDH1, CDK4, CDKN1B, CDKN2A, CEBPA, CHEK2, CTNNA1, DDX41, DICER1, ETV6, FH, FLCN, GATA2, LZTR1, MAX, MBD4, MEN1, MET, MLH1, MSH2, MSH3, MSH6, MUTYH, NF1, NF2, NTHL1, PALB2, PHOX2B, PMS2, POT1, PRKAR1A, PTCH1, PTEN, RAD51C, RAD51D, RB1, RET, RPS20, RUNX1, SDHA, SDHAF2, SDHB, SDHC, SDHD, SMAD4, SMARCA4, SMARCB1, SMARCE1, STK11, SUFU, TMEM127, TP53, TSC1, TSC2, VHL, and WT1 (sequencing and deletion/duplication); EGFR, HOXB13, KIT, MITF, PDGFRA, POLD1, and POLE (sequencing only); EPCAM and GREM1 (deletion/duplication only).   We discussed that some people do not want to undergo genetic testing due to fear of genetic discrimination.  A federal law called the Genetic Information Non-Discrimination Act (GINA) of 2008 helps protect individuals against genetic discrimination based on their genetic test results.  It impacts both health insurance and employment.  For health insurance, it protects against increased premiums, being kicked off insurance or being forced to take a test in order to be insured.  For employment it protects against hiring, firing and promoting decisions based on genetic test results.  Health status due to a cancer diagnosis is not protected under GINA.  This law does not protect life insurance, disability insurance, or other types of insurance.   PLAN: After considering the risks, benefits, and limitations, Ms. Buckingham provided informed consent to pursue genetic testing and the blood sample was sent to [facility] for analysis of the CancerNext-Expanded+RNA Panel. Results should be  available within approximately 2-3 weeks' time, at which point they will be disclosed by telephone to Ms. Sarver, as will any additional recommendations warranted by these results. Ms. Vowles will receive a summary of her genetic counseling visit and a copy of her results once available. This information will also be available in Epic.   Ms. Rody questions were answered to her satisfaction today. Our contact information was provided should additional questions or concerns arise. Thank you for the referral and allowing us  to share in the care of your patient.   Dena Cary, MS, [facility] Genetic Counselor [email or web address] Phone: [phone]  I personally spent a total of 50 minutes in the care of the patient today including counseling and educating, placing orders, and documenting clinical information in the EHR. Dr. Delinda was available for discussion regarding this case.   _______________________________________________________________________ For Office Staff:  Number of people involved in session: 1 Was an Intern/ student involved with case: no

## 2024-11-09 ENCOUNTER — Inpatient Hospital Stay: Admission: RE | Admit: 2024-11-09 | Discharge: 2024-11-09 | Attending: Family Medicine | Admitting: Family Medicine

## 2024-11-09 DIAGNOSIS — E041 Nontoxic single thyroid nodule: Secondary | ICD-10-CM

## 2024-11-13 ENCOUNTER — Inpatient Hospital Stay: Admission: RE | Admit: 2024-11-13 | Discharge: 2024-11-13 | Attending: Family Medicine | Admitting: Family Medicine

## 2024-11-13 ENCOUNTER — Ambulatory Visit
Admission: RE | Admit: 2024-11-13 | Discharge: 2024-11-13 | Disposition: A | Discharge status: Home / Self Care | Source: Ambulatory Visit | Attending: Family Medicine | Admitting: Family Medicine

## 2024-11-13 DIAGNOSIS — R2231 Localized swelling, mass and lump, right upper limb: Secondary | ICD-10-CM | POA: Diagnosis not present

## 2024-11-13 DIAGNOSIS — R92323 Mammographic fibroglandular density, bilateral breasts: Secondary | ICD-10-CM | POA: Diagnosis not present

## 2024-11-14 ENCOUNTER — Encounter: Admitting: Family Medicine

## 2024-11-14 ENCOUNTER — Ambulatory Visit: Payer: Self-pay | Admitting: Family Medicine

## 2024-11-21 ENCOUNTER — Ambulatory Visit: Payer: Self-pay | Admitting: Licensed Clinical Social Worker

## 2024-11-21 ENCOUNTER — Telehealth: Payer: Self-pay | Admitting: Licensed Clinical Social Worker

## 2024-11-21 DIAGNOSIS — Z1379 Encounter for other screening for genetic and chromosomal anomalies: Secondary | ICD-10-CM

## 2024-11-21 NOTE — Telephone Encounter (Signed)
 I contacted Ms. Fulp to discuss her genetic testing results. No pathogenic variants were identified in the 77 genes analyzed. Detailed clinic note to follow.   The test report has been scanned into EPIC and is located under the Molecular Pathology section of the Results Review tab.  A portion of the result report is included below for reference.      Dena Cary, MS, [facility] Genetic Counselor [email or web address] Phone: [phone]

## 2024-11-21 NOTE — Progress Notes (Signed)
 HPI:   Ms. Coss was previously seen in the [facility] due to a family history of cancer and concerns regarding a hereditary predisposition to cancer. Please refer to our prior cancer genetics clinic note for more information regarding our discussion, assessment and recommendations, at the time. Ms. Tatlock recent genetic test results were disclosed to her, as were recommendations warranted by these results. These results and recommendations are discussed in more detail below.  CANCER HISTORY:  Oncology History   No problem history exists.    FAMILY HISTORY:  We obtained a detailed, 4-generation family history.  Significant diagnoses are listed below: Family History  Problem Relation Age of Onset   Dementia Mother    High Cholesterol Mother    Hypertension Father    COPD Father        smoker   Heart attack Father    High Cholesterol Father    Stroke Father    Hypothyroidism Father    Hypertension Sister    High Cholesterol Sister    Allergic rhinitis Sister    Asthma Sister    AVM Sister        with bleed ([facility])   Breast cancer Maternal Grandmother    Cancer Maternal Grandmother        unsure type   Early death Paternal Grandfather 78       massive MI   Heart attack Paternal Grandfather    Hypertension Brother    High Cholesterol Brother    Eczema Neg Hx    Immunodeficiency Neg Hx    Urticaria Neg Hx       Ms. Zollinger is unaware of previous family history of genetic testing for hereditary cancer risks. There is no reported Ashkenazi Jewish ancestry. There is no known consanguinity.  GENETIC TEST RESULTS:  The [facility] CancerNext-Expanded+RNA Panel found no pathogenic mutations.   The CancerNext-Expanded gene panel offered by [facility] and includes sequencing, rearrangement, and RNA analysis for the following 77 genes: AIP, ALK, APC, ATM, AXIN2, BAP1, BARD1, BMPR1A, BRCA1, BRCA2, BRIP1, CDC73, CDH1, CDK4, CDKN1B, CDKN2A, CEBPA, CHEK2,  CTNNA1, DDX41, DICER1, ETV6, FH, FLCN, GATA2, LZTR1, MAX, MBD4, MEN1, MET, MLH1, MSH2, MSH3, MSH6, MUTYH, NF1, NF2, NTHL1, PALB2, PHOX2B, PMS2, POT1, PRKAR1A, PTCH1, PTEN, RAD51C, RAD51D, RB1, RET, RPS20, RUNX1, SDHA, SDHAF2, SDHB, SDHC, SDHD, SMAD4, SMARCA4, SMARCB1, SMARCE1, STK11, SUFU, TMEM127, TP53, TSC1, TSC2, VHL, and WT1 (sequencing and deletion/duplication); EGFR, HOXB13, KIT, MITF, PDGFRA, POLD1, and POLE (sequencing only); EPCAM and GREM1 (deletion/duplication only).   The test report has been scanned into EPIC and is located under the Molecular Pathology section of the Results Review tab.  A portion of the result report is included below for reference. Genetic testing reported out on 11/17/2024.      Even though a pathogenic variant was not identified, possible explanations for the cancer in the family may include: There may be no hereditary risk for cancer in the family. The cancers in Ms. Boliver and/or her family may be sporadic/familial or due to other genetic and environmental factors. There may be a gene mutation in one of these genes that current testing methods cannot detect but that chance is small. There could be another gene that has not yet been discovered, or that we have not yet tested, that is responsible for the cancer diagnoses in the family.  It is also possible there is a hereditary cause for the cancer in the family that Ms. Yanes did not inherit. Therefore, it  is important to remain in touch with cancer genetics in the future so that we can continue to offer Ms. Fenstermaker the most up to date genetic testing.   ADDITIONAL GENETIC TESTING:  We discussed with Ms. Girten that her genetic testing was fairly extensive.  If there are additional relevant genes identified to increase cancer risk that can be analyzed in the future, we would be happy to discuss and coordinate this testing at that time.    CANCER SCREENING RECOMMENDATIONS:  Ms. Ehresman test result is considered  negative (normal).  This means that we have not identified a hereditary cause for her family history of cancer at this time.   An individual's cancer risk and medical management are not determined by genetic test results alone. Overall cancer risk assessment incorporates additional factors, including personal medical history, family history, and any available genetic information that may result in a personalized plan for cancer prevention and surveillance. Therefore, it is recommended she continue to follow the cancer management and screening guidelines provided by her primary healthcare provider.  RECOMMENDATIONS FOR FAMILY MEMBERS:   Since she did not inherit a identifiable mutation in a cancer predisposition gene included on this panel, her children could not have inherited a known mutation from her in one of these genes. Individuals in this family might be at some increased risk of developing cancer, over the general population risk, due to the family history of cancer.  Individuals in the family should notify their providers of the family history of cancer. We recommend women in this family have a yearly mammogram beginning at age 33, or 2 years younger than the earliest onset of cancer, an annual clinical breast exam, and perform monthly breast self-exams.  Family members should have colonoscopies by at age 28, or earlier, as recommended by their providers.  FOLLOW-UP:  Lastly, we discussed with Ms. Livesay that cancer genetics is a rapidly advancing field and it is possible that new genetic tests will be appropriate for her and/or her family members in the future. We encouraged her to remain in contact with cancer genetics on an annual basis so we can update her personal and family histories and let her know of advances in cancer genetics that may benefit this family.   Our contact number was provided. Ms. Henegar questions were answered to her satisfaction, and she knows she is welcome to call us   at anytime with additional questions or concerns.    Dena Cary, MS, [facility] [email or web address] Phone: [phone]

## 2024-11-24 ENCOUNTER — Encounter

## 2025-02-22 ENCOUNTER — Ambulatory Visit: Admit: 2025-02-22 | Admitting: Gastroenterology

## 2025-02-22 SURGERY — COLONOSCOPY
Anesthesia: General

## 2025-04-30 ENCOUNTER — Inpatient Hospital Stay

## 2025-05-03 ENCOUNTER — Inpatient Hospital Stay: Admitting: Oncology

## 2025-05-03 ENCOUNTER — Inpatient Hospital Stay

## 2025-10-24 ENCOUNTER — Other Ambulatory Visit

## 2025-10-31 ENCOUNTER — Encounter: Admitting: Family Medicine
